# Patient Record
Sex: Female | Born: 1980 | Race: Black or African American | Hispanic: No | Marital: Single | Smoking: Former smoker
Health system: Southern US, Community
[De-identification: ages and names within clinical notes are randomized; demographics above are authoritative.]

## PROBLEM LIST (undated history)

## (undated) DIAGNOSIS — D649 Anemia, unspecified: Secondary | ICD-10-CM

## (undated) DIAGNOSIS — M545 Low back pain, unspecified: Secondary | ICD-10-CM

## (undated) DIAGNOSIS — E559 Vitamin D deficiency, unspecified: Secondary | ICD-10-CM

## (undated) HISTORY — DX: Vitamin D deficiency, unspecified: E55.9

---
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
## 2001-05-09 ENCOUNTER — Other Ambulatory Visit: Admission: RE | Admit: 2001-05-09 | Discharge: 2001-05-09 | Payer: Self-pay | Admitting: *Deleted

## 2001-10-30 ENCOUNTER — Inpatient Hospital Stay (HOSPITAL_COMMUNITY): Admission: AD | Admit: 2001-10-30 | Discharge: 2001-11-02 | Payer: Self-pay | Admitting: *Deleted

## 2002-08-16 ENCOUNTER — Other Ambulatory Visit: Admission: RE | Admit: 2002-08-16 | Discharge: 2002-08-16 | Payer: Self-pay | Admitting: *Deleted

## 2002-08-23 ENCOUNTER — Ambulatory Visit (HOSPITAL_COMMUNITY): Admission: RE | Admit: 2002-08-23 | Discharge: 2002-08-23 | Payer: Self-pay | Admitting: *Deleted

## 2002-08-23 ENCOUNTER — Encounter: Payer: Self-pay | Admitting: *Deleted

## 2002-11-12 ENCOUNTER — Inpatient Hospital Stay (HOSPITAL_COMMUNITY): Admission: AD | Admit: 2002-11-12 | Discharge: 2002-11-14 | Payer: Self-pay | Admitting: *Deleted

## 2003-11-26 ENCOUNTER — Inpatient Hospital Stay (HOSPITAL_COMMUNITY): Admission: AD | Admit: 2003-11-26 | Discharge: 2003-11-27 | Payer: Self-pay | Admitting: *Deleted

## 2004-07-19 ENCOUNTER — Emergency Department (HOSPITAL_COMMUNITY): Admission: EM | Admit: 2004-07-19 | Discharge: 2004-07-19 | Payer: Self-pay | Admitting: Emergency Medicine

## 2005-03-01 ENCOUNTER — Inpatient Hospital Stay (HOSPITAL_COMMUNITY): Admission: AD | Admit: 2005-03-01 | Discharge: 2005-03-01 | Payer: Self-pay | Admitting: Obstetrics and Gynecology

## 2005-03-03 ENCOUNTER — Inpatient Hospital Stay (HOSPITAL_COMMUNITY): Admission: AD | Admit: 2005-03-03 | Discharge: 2005-03-03 | Payer: Self-pay | Admitting: Obstetrics and Gynecology

## 2006-06-05 ENCOUNTER — Emergency Department (HOSPITAL_COMMUNITY): Admission: EM | Admit: 2006-06-05 | Discharge: 2006-06-06 | Payer: Self-pay | Admitting: Emergency Medicine

## 2007-01-17 ENCOUNTER — Emergency Department (HOSPITAL_COMMUNITY): Admission: EM | Admit: 2007-01-17 | Discharge: 2007-01-17 | Payer: Self-pay | Admitting: Emergency Medicine

## 2007-05-24 ENCOUNTER — Inpatient Hospital Stay (HOSPITAL_COMMUNITY): Admission: AD | Admit: 2007-05-24 | Discharge: 2007-05-24 | Payer: Self-pay | Admitting: Obstetrics & Gynecology

## 2007-05-29 ENCOUNTER — Ambulatory Visit (HOSPITAL_COMMUNITY): Admission: RE | Admit: 2007-05-29 | Discharge: 2007-05-29 | Payer: Self-pay | Admitting: Obstetrics and Gynecology

## 2007-07-18 ENCOUNTER — Inpatient Hospital Stay (HOSPITAL_COMMUNITY): Admission: AD | Admit: 2007-07-18 | Discharge: 2007-07-18 | Payer: Self-pay | Admitting: Obstetrics and Gynecology

## 2007-08-25 ENCOUNTER — Inpatient Hospital Stay (HOSPITAL_COMMUNITY): Admission: RE | Admit: 2007-08-25 | Discharge: 2007-08-28 | Payer: Self-pay | Admitting: Obstetrics and Gynecology

## 2007-12-31 ENCOUNTER — Emergency Department (HOSPITAL_COMMUNITY): Admission: EM | Admit: 2007-12-31 | Discharge: 2007-12-31 | Payer: Self-pay | Admitting: Emergency Medicine

## 2008-03-12 ENCOUNTER — Emergency Department (HOSPITAL_COMMUNITY): Admission: EM | Admit: 2008-03-12 | Discharge: 2008-03-12 | Payer: Self-pay | Admitting: Emergency Medicine

## 2009-12-15 ENCOUNTER — Ambulatory Visit: Payer: Self-pay | Admitting: Physician Assistant

## 2009-12-15 ENCOUNTER — Inpatient Hospital Stay (HOSPITAL_COMMUNITY): Admission: AD | Admit: 2009-12-15 | Discharge: 2009-12-15 | Payer: Self-pay | Admitting: Family Medicine

## 2010-08-23 LAB — URINALYSIS, ROUTINE W REFLEX MICROSCOPIC
Bilirubin Urine: NEGATIVE
Glucose, UA: NEGATIVE mg/dL
Ketones, ur: 15 mg/dL — AB
Leukocytes, UA: NEGATIVE
Nitrite: POSITIVE — AB
Protein, ur: 100 mg/dL — AB
Specific Gravity, Urine: >1.03 — ABNORMAL HIGH (ref 1.005–1.030)
Urobilinogen, UA: 1 mg/dL (ref 0.0–1.0)
pH: 5.5 (ref 5.0–8.0)

## 2010-08-23 LAB — POCT PREGNANCY, URINE: Preg Test, Ur: NEGATIVE

## 2010-08-23 LAB — WET PREP, GENITAL

## 2010-08-23 LAB — URINE CULTURE: Colony Count: >=100000

## 2010-08-23 LAB — URINE MICROSCOPIC-ADD ON

## 2010-10-20 NOTE — H&P (Signed)
NAMESHY, GUALLPA NO.:  [id number]   MEDICAL RECORD NO.:  [id number]           PATIENT TYPE:   LOCATION:                                 FACILITY:   PHYSICIAN:  Zenaida Niece, M.D.     DATE OF BIRTH:   DATE OF ADMISSION:  08/25/2007  DATE OF DISCHARGE:                              HISTORY & PHYSICAL   CHIEF COMPLAINT:  Repeat cesarean section.   HISTORY OF PRESENT ILLNESS:  This is a 30 year old black female gravida  3, para 2-0-0-2 with an EGA of [redacted] weeks by  a 26-week ultrasound, with a  due date of March 27 who presents for repeat cesarean section.  She has  had two prior cesarean sections and wishes to undergo a repeat cesarean  section.  Prenatal care has been complicated by the fact that she did  not establish prenatal care until 26 weeks.  She was also noted to have  low platelets on routine labs, without any significant symptoms.  She  did develop proteinuria with normal PIH labs and intermittently slightly  elevated blood pressure.  She did eventually have a 24-hour urine  collection which revealed 910 mg of protein and a creatinine clearance  of 154 mL per minute.  She was followed intermittently with nonstress  tests, which have been reactive.  She is being admitted today for repeat  cesarean section.   PRENATAL LABS:  Blood type is O+ with a negative antibody screen.  RPR  nonreactive, rubella immune, hepatitis B surface antigen negative, HIV  negative, gonorrhea and chlamydia negative.  Initial hemoglobin is 10.1,  and initial platelet count is 99,000.  One-hour Glucola is 57 and group  B strep is positive.   PAST OBSTETRIC HISTORY:  In 2003, she had a cesarean section at term for  non-reassuring fetal heart tracing, baby weighed 6 pounds, 12 ounces,  that was at 40 weeks.  In June 2004, she had a repeat cesarean section  at 40 weeks.  That baby weighed 5 pounds, 11 ounces.   GYNECOLOGICAL HISTORY:  She does have a remote history of  gonorrhea and  chlamydia.  No abnormal Pap smears, and she had a Mirena IUD removed in  September 2007.   PAST MEDICAL HISTORY:  The patient denies.   PAST SURGICAL HISTORY:  Cesarean section x2.   ALLERGIES:  NONE KNOWN.   CURRENT MEDICATIONS:  None.   REVIEW OF SYSTEMS:  Normal complaints of pregnancy.   SOCIAL HISTORY:  The patient is single and does admit to some tobacco  use.   FAMILY HISTORY:  Is noncontributory.   PHYSICAL EXAM:  VITAL SIGNS:  Weight is 249 pounds, blood pressure is  150/80.  GENERAL:  This is a slightly obese, gravid female in no acute distress.  NECK:  Supple without lymphadenopathy or thyromegaly.  LUNGS:  Clear to auscultation.  HEART:  Regular rate and rhythm without murmur.  ABDOMEN:  Gravid with a fundal height of 39-40 cm and an estimated fetal  weight of 7-1/2 pounds.  She does have a well-healed transverse scar.  EXTREMITIES:  Have 1+ edema and are nontender.  Cervix is 1, 40, -3 vertex presentation.   ASSESSMENT:  1. Intrauterine pregnancy at 39 weeks with two previous cesarean      sections.  The patient wishes to undergo a repeat cesarean section.      Surgery and risks have been discussed with the patient, and she      understands.  2. Proteinuria and mild thrombocytopenia.  She has had normal PIH labs      off and on throughout the pregnancy and only intermittently      elevated blood pressures, so I do not believe that she has      preeclampsia.  These labs will be evaluated pre-op by anesthesia,      to determine the feasibility of spinal anesthesia for her cesarean      section.  3. Group B strep positive.   PLAN:  Is to admit the patient on March 20 for repeat cesarean section.      Zenaida Niece, M.D.  Electronically Signed     TDM/MEDQ  D:  08/24/2007  T:  08/24/2007  Job:  [id number]

## 2010-10-20 NOTE — Op Note (Signed)
[name], [name]              ACCOUNT NO.:  [id number]   MEDICAL RECORD NO.:  [id number]          PATIENT TYPE:  INP   LOCATION:  9199                          FACILITY:  [facility]   PHYSICIAN:  Zenaida Niece, M.D.DATE OF BIRTH:  04/23/1981   DATE OF PROCEDURE:  08/25/2007  DATE OF DISCHARGE:                               OPERATIVE REPORT   PREOPERATIVE DIAGNOSES:  1. Intrauterine pregnancy at 39 weeks.  2. Previous cesarean section x2.  3. Proteinuria.  4. Thrombocytopenia.   POSTOPERATIVE DIAGNOSES:  1. Intrauterine pregnancy at 39 weeks.  2. Previous cesarean section x2.  3. Proteinuria.  4. Thrombocytopenia.   PROCEDURE:  Repeat low transverse cesarean section.   SURGEON:  Zenaida Niece, MD.   ASSISTANT:  Tracey Harries, MD.   ANESTHESIA:  Spinal.   FINDINGS:  The patient had a normal gravid anatomy except for some  omental adhesions to the anterior abdominal wall.  She delivered a  viable female infant with Apgars of 8 and 9, weight 6 pounds 3 ounces.   SPECIMENS:  Placenta sent for cord blood collection and then to labor  and delivery.   ESTIMATED BLOOD LOSS:  1000 mL.   COMPLICATIONS:  None.   PROCEDURE IN DETAIL:  The patient was taken to the operating room and  placed in the sitting position.  Dr. Malen Gauze instilled spinal anesthesia  and she was then placed in the dorsosupine position with a left lateral  tilt.  The abdomen was then prepped and draped in the usual sterile  fashion and a Foley catheter was inserted.  The level of her anesthesia  was found to be adequate and the abdomen was entered via a standard  Pfannenstiel's incision through her previous scar.  Once the peritoneal  cavity was entered, some filmy omental adhesions were noted.  These were  taken down with electrocautery.  Once the lower uterine segment was well  exposed, the Alexis disposable self-retaining retractor was placed and  good uterine exposure was noted.  A 4 cm transversus  incision was then  made in the lower uterine segment pushing the bladder inferior.  Once  the uterine cavity was entered,  the incision was extended bilaterally  digitally.  Membranes were ruptured revealing clear fluid.  The fetal  vertex was grasped and easily delivered through the incision.  The mouth  and nares were suctioned and the remainder of the infant delivered  atraumatically.  The cord was doubly clamped and cut and the infant  handed to the awaiting pediatric team.  The placenta delivered  spontaneously and was sent for cord blood collection.  The uterus was  wiped dry with a clean lap pad and all clots and debris removed. The  uterine incision was inspected and found to be free of extensions.  The  uterine incision was then closed in one layer being a running locking  layer with #1 chromic.  A small amount of bleeding from the right angle  was controlled with some imbricating sutures of #1 chromic.  The  remainder of the incision was hemostatic.  Tubes and ovaries were  inspected and found to be normal.  The uterine incision was again  inspected and found to be hemostatic.  The subfascial space was then  irrigated and made hemostatic with electrocautery after the retractor  was removed.  The rectus muscles were reapproximated in the midline due  to a significant diastasis with interrupted sutures of #1 chromic.  The  fascia was then closed in a running fashion starting at both ends and  meeting in the middle with #0 Vicryl.  The subcutaneous tissue was then  irrigated and made hemostatic with electrocautery.  The skin edges were  freed up with electrocautery.  The subcutaneous tissue was closed with  running 2-0 plain gut suture. The skin was then closed with staples.  The patient tolerated the procedure well and was taken to the recovery  room in stable condition.  Counts were correct x2, she was given Ancef 1  gram IV prior to the procedure and had PAS hose on throughout  the  procedure.      Zenaida Niece, M.D.  Electronically Signed     TDM/MEDQ  D:  08/25/2007  T:  08/25/2007  Job:  [id number]

## 2010-10-23 NOTE — Discharge Summary (Signed)
[name], [name]              ACCOUNT NO.:  [id number]   MEDICAL RECORD NO.:  [id number]          PATIENT TYPE:  INP   LOCATION:  9129                          FACILITY:  [facility]   PHYSICIAN:  Zenaida Niece, M.D.DATE OF BIRTH:  1981-01-23   DATE OF ADMISSION:  08/25/2007  DATE OF DISCHARGE:  08/28/2007                               DISCHARGE SUMMARY   ADMISSION DIAGNOSES:  1. Intrauterine pregnancy at 39 weeks.  2. Previous cesarean section x2.  3. Proteinuria.  4. Thrombocytopenia.   DISCHARGE DIAGNOSES:  1. Intrauterine pregnancy at 39 weeks.  2. Previous cesarean section x2.  3. Proteinuria.  4. Thrombocytopenia.   PROCEDURE:  On August 25, 2007, she had a repeat low-transverse cesarean  section.   HISTORY AND PHYSICAL:  Please see chart for full history and physical.  Briefly, this is a 30 year old black female gravida 3, para 2-0-0-2 with  an EGA of [redacted] weeks who is admitted for repeat cesarean section.  She has  had proteinuria with normal labs except for thrombocytopenia and  essentially normal blood pressures during the pregnancy.   PAST MEDICAL HISTORY:  Past history significant for 2 cesarean sections.   PHYSICAL EXAM:  VITAL SIGNS:  Weight is 249 pounds on admission, last  blood pressure in the office was 150/80.  ABDOMEN:  Obese, gravid, nontender, and a well-healed transverse scar.  CERVIX:  1, 40, -3, and vertex.   HOSPITAL COURSE:  The patient is admitted on the day of surgery and  underwent repeat cesarean section under spinal anesthesia.  She had  normal anatomy except for some omental adhesions.  She delivered a  viable female infant with Apgars of 8 and 9, weighed 6 pounds 3 ounces.  Estimated blood loss was 1000 mL.  Postoperatively, she had no  significant complications.  Blood pressures were 120s-140s over 80-90 on  postoperative day #1.  Predelivery hemoglobin 10.2, postdelivery 8.2.  Platelet count was 101,000 before delivery and 74,000 on  postoperative  day #1.  She was able to ambulate and remained afebrile.  On  postoperative day #2, hemoglobin dropped a little bit to 7.7 and  platelets were stable at 88,000.  On postoperative day #3, she was doing  well, ambulating, incision was well approximated, and she was felt to be  stable enough for discharge home.   DISCHARGE INSTRUCTIONS:  Regular diet, pelvic rest, and no strenuous  activities.  Followup is in 4 days to remove her staples.   MEDICATIONS:  Percocet #40 1-2 p.o. q.4-6 h. p.r.n. pain, and she was  given our discharge pamphlet.      Zenaida Niece, M.D.  Electronically Signed     TDM/MEDQ  D:  09/10/2007  T:  09/10/2007  Job:  [id number]

## 2010-10-23 NOTE — Op Note (Signed)
NAME:  [name], KISHBAUGH                        ACCOUNT NO.:  [id number]   MEDICAL RECORD NO.:  [id number]                   PATIENT TYPE:  INP   LOCATION:  9117                                 FACILITY:  [facility]   PHYSICIAN:  Georgina Peer, M.D.              DATE OF BIRTH:  05-03-1981   DATE OF PROCEDURE:  11/12/2002  DATE OF DISCHARGE:                                 OPERATIVE REPORT   PREOPERATIVE DIAGNOSES:  1. Previous section at 39+ weeks.  2. Hypertension.   POSTOPERATIVE DIAGNOSES:  1. Previous section at 39+ weeks.  2. Hypertension.   OPERATION PERFORMED:  Repeat low cervical transverse cesarean section.   SURGEON:  Georgina Peer, M.D.   ASSISTANT:  Rudy Jew. Sugey Royalty, M.D.   ESTIMATED BLOOD LOSS:  800 mL.   ANESTHESIA:  Spinal anesthesia per Ciano Corrente, M.D.   FINDINGS:  A 5 pound 10 ounce female at 8:06.  Apgars 5 and 8.  Meconium  stained fluid noted.  Nuchal cord x1 noted.  Cord pH 7.03 arterial.  Normal  tubes and ovaries.   COMPLICATIONS:  None.   INDICATIONS FOR PROCEDURE:  A 30 year old gravida 2, para 1 at 39+ weeks  presented for elective repeat cesarean section.  She declined VBAC,  understanding risks and complications for elective repeat section and risks  of trial of labor.  The patient had mildly elevated blood pressure at 140/82  in the office four days ago.  She denies headache, blurred vision or  epigastric pain.  She denied vaginal bleeding or excess swelling.  She had  reported normal fetal movement.  Blood pressure on admission was 142/93.  The patient had no epigastric pain, normal reflexes.  She was taken back to  the C-section suite and given spinal anesthetic by Dr. Harvest Forest.  After  adequate anesthesia, she was prepped and draped in the normal sterile  fashion.  Panniculus was taped up to allow good incisional exposure.  Transverse skin incision was made through the previous incision.  This was  taken down into the peritoneal  cavity in the normal Pfannenstiel manner.  There were no intra-abdominal adhesions noted.  Transverse bladder flap was  created.  There was a poorly  developed lower uterine segment in extensive  vascularity.  A midline incision was made in the uterus which was thin.  This was widened in a V-shape with bandage scissors.  An infant female was  delivered with the aid of fundal pressure at 8:06.  The membranes were  ruptured yielding particulate but thin meconium.  The infant was meconium  stained.  Mouth and nose were suctioned with DeLee and with bulb.  There was  a loose nuchal cord x1.  The Apgars were assigned at 5 and 8 and a cord pH  7.03 arterial.  The patient, after delivery, had the uterus exteriorized for  exposure. The incision was without extension.  The placenta  was manually  removed and the uterus contracted well.  The incision was closed with one  layer of running locked Vicryl with interrupted and figure-of-eight sutures  of Vicryl to control bleeding along the edges.  There was excellent  hemostasis. The tubes and ovaries were inspected and found to be normal.  The uterus was then placed back into the abdominal cavity and the incision  inspected and any bleeders were controlled with fine sutures.  The pelvis  was irrigated.  Blood and debris was removed.  The bladder flap was dry.  The peritoneum was closed with Vicryl suture. The fascia closed with Vicryl  suture.  The subcutaneous tissue cauterized for any bleeders and closed with  plain gut and the skin closed with skin staples.  The patient received 1 g  of Ancef after the cord was clamped and this will be continued for two more  doses postoperatively.  The patient was stable and sent to the recovery room  in good condition.  Preeclampsia labs, a catheterized urine and blood  pressure monitoring will take place postoperatively.                                               Georgina Peer, M.D.    JPN/MEDQ  D:   11/12/2002  T:  11/12/2002  Job:  [id number]

## 2010-10-23 NOTE — Discharge Summary (Signed)
[facility]  Patient:    [name], [name] Visit Number: [id number] MRN: [id number]          Service Type: OBS Location: 910A 9138 01 Attending Physician:  Pleas Koch Dictated by:   Georgina Peer, M.D. Admit Date:  10/30/2001 Discharge Date: 11/02/2001                             Discharge Summary  ADMISSION DIAGNOSES:          1. Pregnancy.                               2. Labor.                               3. Nonreassuring fetal heart rate tracing.  HISTORY OF PRESENT ILLNESS:   The patient is a 30 year old primigravida, [facility] Oct 31, 2001.  The patient presented with regular contractions, was managed by Dr. Wilhemina Cash in labor.  She had nonreassuring fetal heart rate tracing and rupture of membranes with moderate meconium.  She did not improve with amnioinfusion.  HOSPITAL COURSE:              She underwent a low transverse cesarean section on Oct 30, 2001, at which time a female infant, [name] was born, Apgars 8 and 9. Cord pH was 7.19.  Postpartum, the patient did well.  She remained afebrile. Postoperative hemoglobin was 9.7 from a preoperative of 11.4. Platelet count was mildly decreased at 91,000 with a preoperative of 106,000. The patients white count was 9.9.  She was eating, ambulating, and voiding without difficulty.  She transitioned to solid food.  Her wound was clean and dry and a subcuticular suture had been placed.  She had met all of the criteria for discharge, passing flatus, and ambulating without dizziness.  She was discharged on the third day in good condition with prescription for Percocet and Motrin.  She was given a discharge booklet and will follow up in the office in four to six weeks.  She will watch for signs of infection, fever, or heavy vaginal bleeding.  She will avoid intercourse and heavy lifting for six weeks and will call for any problems.  Repeat platelet count will be performed as well as hemoglobin at  the six-week mark. Dictated by:   Georgina Peer, M.D. Attending Physician:  Pleas Koch DD:  11/02/01 TD:  11/03/01 Job: [id number]

## 2010-10-23 NOTE — Discharge Summary (Signed)
   NAME:  [name], [name]                        ACCOUNT NO.:  [id number]   MEDICAL RECORD NO.:  [id number]                   PATIENT TYPE:  INP   LOCATION:  9117                                 [facility]ACILITY[facility]   PHYSICIAN:  Georgina Peer, M.D.              DATE OF BIRTH:  04-04-1981   DATE OF ADMISSION:  11/12/2002  DATE OF DISCHARGE:  11/14/2002                                 DISCHARGE SUMMARY   ADMISSION DIAGNOSIS:  Previous cesarean section, desires repeat.   DISCHARGE DIAGNOSES:  1. Previous cesarean section, desires repeat.  2. Blood-loss-related mild anemia which is well compensated.   BRIEF HISTORY:  A 30 year old gravida 2 para 1 presented for repeat C  section; see H&P for details.   The C section was uneventful and went well.  The infant had mild meconium.  The weight was 5 pounds 10 ounces and it was a female.  Apgars were 5 and 8.  There was an 800 mL blood loss.  The patient postpartum did well.  She  passed flatus by the first day.  She was ambulating, eating, and advanced to  solid foods.  She was taking Percocet and Motrin for pain.  Her hemoglobin  was 8.6 from preoperative of 10.9 with platelet count of 90,000 which was up  from 80,000 the day of surgery.  Her white blood cell count was 8.2.  Her  PIH labs were normal and her urine protein was negative.  The patient's  incision was clean, the staples were intact, there was no drainage.  Her  abdomen was soft and nontender.   She was ready for discharge by postoperative day #2 and discharged home with  prescriptions for Percocet 5/325 #60 and Motrin  600 mg #60.  She was given a discharge booklet.  She will return to the  office within one week for staple removal.                                               Georgina Peer, M.D.    JPN/MEDQ  D:  11/14/2002  T:  11/14/2002  Job:  [id number]

## 2010-10-23 NOTE — Op Note (Signed)
[facility]  Patient:    [name], [name] Visit Number: [id number] MRN: [id number]          Service Type: OBS Location: 910A 9138 01 Attending Physician:  Pleas Koch Dictated by:   Ronda Fairly. Galen Daft, M.D. Proc. Date: 10/30/01 Admit Date:  10/30/2001   CC:         Georgina Peer, M.D.   Operative Report  PREOPERATIVE DIAGNOSES:       Fetal distress, term pregnancy with meconium.  POSTOPERATIVE DIAGNOSES:      Fetal distress, term pregnancy with meconium.  PROCEDURE:                    Primary low transverse cesarean section.  SURGEON:                      Ronda Fairly. Galen Daft, M.D.  ANESTHESIA:                   Epidural.  CONSULTATION:                 None.  ESTIMATED BLOOD LOSS:         500 cc.  PROCEDURE NOTE:               The patient was in the labor room with nonreassuring tracing.  Rupture of membranes was carried out, there was moderate meconium noted.  Amnio infusion started and internal lead was placed with intrauterine pressure catheter.  The tracing was defined as "repetitive late deceleration, with normal beat-to-beat variability and moderate meconium."  The decision was made that this was acceptable for condition for immediate primary cesarean section.  The patient was apprised of this option and we both consented to the same.  The patient was brought back to the operating room.  She received prophylactic antibiotic therapy.  The indwelling Foley catheter in bladder.  Prepped with Betadine and draped in sterile technique.  A Pfannenstiel incision was utilized after anesthesia was adequately tested.  The abdomen was entered without difficulty.  The bladder flap was developed using the visceral peritoneum and the bladder blade.  The adjacent organs were retracted out of the way, and the low cervical uterine incision was carried out.  The baby was occiput posterior position.  There was moderate meconium.  After delivery  of the head, both the nasopharynx on both sides and the oropharynx were suctioned with the [facility] suction-type catheter.  The pediatrician was present at the delivery.  The baby was brought to the warmer immediately for additional suctioning.  There was no meconium below the cords.  The cord pH came back as 7.19.  The Apgar scores for the baby were 8 at one minute and 9 at five minutes.  The babys name was Deronte; his weight was 6 pounds 12 ounces.  The placenta was delivered manually.  The uterus swept free of all contents, and it was clear and empty.  The uterus was then closed with 0 Vicryl in a running locking fashion, followed by embrocating layer.  There was complete hemostasis noted.  Both tubes and ovaries were unremarkable.  There was a small posterior uterine fibroid, less than 1 cm, mid position.  The remainder of the abdominal cavity was unremarkable from the lower pelvis perspective.  The abdomen was irrigated and suctioned to remove all material.  All fascial tissues were hemostatic.  The peritoneum was closed with Monocryl suture and all subfascial tissues  were again inspected.  The fascia was then closed with #1 Vicryl in a running suture.  This was followed by inspection of subcutaneous tissues, all subcutaneous tissues were hemostatic.  The skin was closed with subcuticular closure.  All instrument, sponge and needle counts were correct throughout the case and at the end of the case.  The patient left the operating room in stable condition.  No complications. Dictated by:   Ronda Fairly. Galen Daft, M.D. Attending Physician:  Pleas Koch DD:  10/30/01 TD:  11/01/01 Job: [id number]

## 2011-02-26 LAB — URINALYSIS, ROUTINE W REFLEX MICROSCOPIC
Ketones, ur: NEGATIVE
Nitrite: NEGATIVE
Protein, ur: 100 — AB
Urobilinogen, UA: 0.2
pH: 7

## 2011-03-01 LAB — URINALYSIS, ROUTINE W REFLEX MICROSCOPIC
Ketones, ur: NEGATIVE
Leukocytes, UA: NEGATIVE
Nitrite: NEGATIVE
Specific Gravity, Urine: 1.025
Urobilinogen, UA: 0.2
pH: 6.5

## 2011-03-01 LAB — URINE MICROSCOPIC-ADD ON

## 2011-03-01 LAB — CBC
HCT: 23.8 — ABNORMAL LOW
HCT: 29.8 — ABNORMAL LOW
Hemoglobin: 10.2 — ABNORMAL LOW
MCHC: 33.5
MCHC: 34.1
MCHC: 34.3
MCV: 89.6
Platelets: 74 — ABNORMAL LOW
Platelets: 88 — ABNORMAL LOW
RBC: 2.55 — ABNORMAL LOW
RDW: 12.5
RDW: 12.6
RDW: 13

## 2011-03-01 LAB — COMPREHENSIVE METABOLIC PANEL
CO2: 21
Calcium: 8.8
Creatinine, Ser: 0.43
GFR calc non Af Amer: >60
Glucose, Bld: 77

## 2011-03-12 LAB — GC/CHLAMYDIA PROBE AMP, GENITAL
Chlamydia, DNA Probe: NEGATIVE
GC Probe Amp, Genital: NEGATIVE

## 2011-03-12 LAB — WET PREP, GENITAL
Trich, Wet Prep: NONE SEEN
Yeast Wet Prep HPF POC: NONE SEEN

## 2011-03-12 LAB — URINALYSIS, ROUTINE W REFLEX MICROSCOPIC
Glucose, UA: NEGATIVE
Ketones, ur: 15 — AB
Nitrite: NEGATIVE
Protein, ur: 100 — AB
Urobilinogen, UA: 0.2

## 2011-03-12 LAB — POCT PREGNANCY, URINE: Operator id: 120561

## 2011-03-12 LAB — URINE MICROSCOPIC-ADD ON

## 2012-02-10 ENCOUNTER — Emergency Department (HOSPITAL_COMMUNITY)
Admission: EM | Admit: 2012-02-10 | Discharge: 2012-02-10 | Disposition: A | Discharge status: Home / Self Care | Payer: Self-pay | Attending: Emergency Medicine | Admitting: Emergency Medicine

## 2012-02-10 ENCOUNTER — Encounter (HOSPITAL_COMMUNITY): Payer: Self-pay | Admitting: Emergency Medicine

## 2012-02-10 DIAGNOSIS — K529 Noninfective gastroenteritis and colitis, unspecified: Secondary | ICD-10-CM

## 2012-02-10 DIAGNOSIS — F17200 Nicotine dependence, unspecified, uncomplicated: Secondary | ICD-10-CM | POA: Insufficient documentation

## 2012-02-10 DIAGNOSIS — R109 Unspecified abdominal pain: Secondary | ICD-10-CM | POA: Insufficient documentation

## 2012-02-10 LAB — CBC WITH DIFFERENTIAL/PLATELET
Basophils Relative: 0 % (ref 0–1)
Eosinophils Absolute: 0 10*3/uL (ref 0.0–0.7)
Eosinophils Relative: 0 % (ref 0–5)
Lymphs Abs: 1 10*3/uL (ref 0.7–4.0)
MCH: 30 pg (ref 26.0–34.0)
MCHC: 33 g/dL (ref 30.0–36.0)
MCV: 90.7 fL (ref 78.0–100.0)
Neutrophils Relative %: 82 % — ABNORMAL HIGH (ref 43–77)
Platelets: 129 10*3/uL — ABNORMAL LOW (ref 150–400)

## 2012-02-10 LAB — COMPREHENSIVE METABOLIC PANEL
ALT: 10 U/L (ref 0–35)
Albumin: 3.9 g/dL (ref 3.5–5.2)
Alkaline Phosphatase: 77 U/L (ref 39–117)
BUN: 14 mg/dL (ref 6–23)
Calcium: 9.3 mg/dL (ref 8.4–10.5)
GFR calc Af Amer: 90 mL/min — ABNORMAL LOW (ref >90)
Glucose, Bld: 89 mg/dL (ref 70–99)
Potassium: 4 mEq/L (ref 3.5–5.1)
Sodium: 138 mEq/L (ref 135–145)
Total Protein: 7.7 g/dL (ref 6.0–8.3)

## 2012-02-10 LAB — URINALYSIS, ROUTINE W REFLEX MICROSCOPIC
Leukocytes, UA: NEGATIVE
Nitrite: POSITIVE — AB
Specific Gravity, Urine: 1.034 — ABNORMAL HIGH (ref 1.005–1.030)
Urobilinogen, UA: 1 mg/dL (ref 0.0–1.0)
pH: 6 (ref 5.0–8.0)

## 2012-02-10 LAB — URINE MICROSCOPIC-ADD ON

## 2012-02-10 LAB — POCT PREGNANCY, URINE: Preg Test, Ur: NEGATIVE

## 2012-02-10 LAB — LIPASE, BLOOD: Lipase: 19 U/L (ref 11–59)

## 2012-02-10 MED ORDER — SODIUM CHLORIDE 0.9 % IV SOLN
INTRAVENOUS | Status: DC
  Administered 2012-02-10: 19:00:00 via INTRAVENOUS

## 2012-02-10 MED ORDER — ONDANSETRON HCL 4 MG/2ML IJ SOLN
4.0000 mg | Freq: Once | INTRAMUSCULAR | Status: AC
  Administered 2012-02-10: 4 mg via INTRAVENOUS
  Filled 2012-02-10: qty 2

## 2012-02-10 MED ORDER — ONDANSETRON HCL 4 MG PO TABS: 4.0000 mg | ORAL_TABLET | Freq: Four times a day (QID) | ORAL | Status: AC

## 2012-02-10 MED ORDER — SODIUM CHLORIDE 0.9 % IV BOLUS (SEPSIS)
1000.0000 mL | Freq: Once | INTRAVENOUS | Status: AC
  Administered 2012-02-10: 1000 mL via INTRAVENOUS

## 2012-02-10 NOTE — ED Notes (Signed)
Pt states she is feeling better. No longer feels nauseous.

## 2012-02-10 NOTE — ED Notes (Signed)
Pt d/c home in NAD. Pt voiced understanding of d/c instructions and follow up care.  

## 2012-02-10 NOTE — ED Notes (Signed)
Pt c/o n/v x24hrs. Denies urinary complaints.

## 2012-02-10 NOTE — ED Notes (Signed)
Pt states she is continuing to feel better. Currently voicing no complaints.

## 2012-02-10 NOTE — ED Provider Notes (Addendum)
History     CSN: [id number]  Arrival date & time 02/10/12  1315   First MD Initiated Contact with Patient 02/10/12 1641      Chief Complaint  Patient presents with  . Abdominal Pain    (Consider location/radiation/quality/duration/timing/severity/associated sxs/prior treatment) Patient is a 31 y.o. female presenting with abdominal pain. The history is provided by the patient.  Abdominal Pain The primary symptoms of the illness include abdominal pain.   patient here with vomiting and upper abdominal cramping x24 hours. Notes positive sick exposures. No fever or diarrhea. Denies vaginal bleeding or discharge. Denies any urinary symptoms. Vomiting has been nonbilious. Nothing makes her symptoms better or worse and has used Pepto-Bismol without relief  History reviewed. No pertinent past medical history.  No past surgical history on file.  No family history on file.  History  Substance Use Topics  . Smoking status: Current Everyday Smoker  . Smokeless tobacco: Not on file  . Alcohol Use: Yes    OB History    Grav Para Term Preterm Abortions TAB SAB Ect Mult Living                  Review of Systems  Gastrointestinal: Positive for abdominal pain.  All other systems reviewed and are negative.    Allergies  Review of patient's allergies indicates no known allergies.  Home Medications   Current Outpatient Rx  Name Route Sig Dispense Refill  . ACETAMINOPHEN 500 MG PO TABS Oral Take 500 mg by mouth every 6 (six) hours as needed. For pain.    Marland Kitchen BISMUTH SUBSALICYLATE 262 MG PO CHEW Oral Chew 524 mg by mouth daily as needed. For indigestion.      BP 132/74  Pulse 63  Temp 98.2 F (36.8 C)  Resp 16  SpO2 99%  Physical Exam  Nursing note and vitals reviewed. Constitutional: She is oriented to person, place, and time. Vital signs are normal. She appears well-developed and well-nourished.  Non-toxic appearance. No distress.  HENT:  Head: Normocephalic and atraumatic.    Eyes: Conjunctivae, EOM and lids are normal. Pupils are equal, round, and reactive to light.  Neck: Normal range of motion. Neck supple. No tracheal deviation present. No mass present.  Cardiovascular: Normal rate, regular rhythm and normal heart sounds.  Exam reveals no gallop.   No murmur heard. Pulmonary/Chest: Effort normal and breath sounds normal. No stridor. No respiratory distress. She has no decreased breath sounds. She has no wheezes. She has no rhonchi. She has no rales.  Abdominal: Soft. Normal appearance and bowel sounds are normal. She exhibits no distension. There is tenderness in the epigastric area. There is no rigidity, no rebound, no guarding and no CVA tenderness.  Musculoskeletal: Normal range of motion. She exhibits no edema and no tenderness.  Neurological: She is alert and oriented to person, place, and time. She has normal strength. No cranial nerve deficit or sensory deficit. GCS eye subscore is 4. GCS verbal subscore is 5. GCS motor subscore is 6.  Skin: Skin is warm and dry. No abrasion and no rash noted.  Psychiatric: She has a normal mood and affect. Her speech is normal and behavior is normal.    ED Course  Procedures (including critical care time)  Labs Reviewed  CBC WITH DIFFERENTIAL - Abnormal; Notable for the following:    Platelets 129 (*)     Neutrophils Relative 82 (*)     All other components within normal limits  COMPREHENSIVE METABOLIC PANEL -  Abnormal; Notable for the following:    GFR calc non Af Amer 78 (*)     GFR calc Af Amer 90 (*)     All other components within normal limits  URINALYSIS, ROUTINE W REFLEX MICROSCOPIC - Abnormal; Notable for the following:    Color, Urine AMBER (*)  BIOCHEMICALS MAY BE AFFECTED BY COLOR   APPearance HAZY (*)     Specific Gravity, Urine 1.034 (*)     Hgb urine dipstick SMALL (*)     Bilirubin Urine SMALL (*)     Ketones, ur 15 (*)     Protein, ur 100 (*)     Nitrite POSITIVE (*)     All other components  within normal limits  URINE MICROSCOPIC-ADD ON - Abnormal; Notable for the following:    Squamous Epithelial / LPF MANY (*)     Bacteria, UA MANY (*)     All other components within normal limits  LIPASE, BLOOD  POCT PREGNANCY, URINE   No results found.   No diagnosis found.    MDM  Pt given iv fluids and feel better--repeat abdominal exam benign--no cocnern for appy, ectopic--suspect viral gastro        Toy Baker, MD 02/10/12 1913  Toy Baker, MD 02/10/12 [phone]

## 2012-02-10 NOTE — ED Notes (Signed)
N/v abd pain x 2 4 hours vomiting alot

## 2013-09-24 ENCOUNTER — Emergency Department (HOSPITAL_COMMUNITY): Payer: Self-pay

## 2013-09-24 ENCOUNTER — Encounter (HOSPITAL_COMMUNITY): Payer: Self-pay | Admitting: Emergency Medicine

## 2013-09-24 ENCOUNTER — Emergency Department (HOSPITAL_COMMUNITY)
Admission: EM | Admit: 2013-09-24 | Discharge: 2013-09-24 | Disposition: A | Discharge status: Home / Self Care | Payer: Self-pay | Attending: Emergency Medicine | Admitting: Emergency Medicine

## 2013-09-24 DIAGNOSIS — R059 Cough, unspecified: Secondary | ICD-10-CM | POA: Insufficient documentation

## 2013-09-24 DIAGNOSIS — Z862 Personal history of diseases of the blood and blood-forming organs and certain disorders involving the immune mechanism: Secondary | ICD-10-CM | POA: Insufficient documentation

## 2013-09-24 DIAGNOSIS — Z8739 Personal history of other diseases of the musculoskeletal system and connective tissue: Secondary | ICD-10-CM | POA: Insufficient documentation

## 2013-09-24 DIAGNOSIS — R0789 Other chest pain: Secondary | ICD-10-CM

## 2013-09-24 DIAGNOSIS — R071 Chest pain on breathing: Secondary | ICD-10-CM | POA: Insufficient documentation

## 2013-09-24 DIAGNOSIS — Z3202 Encounter for pregnancy test, result negative: Secondary | ICD-10-CM | POA: Insufficient documentation

## 2013-09-24 DIAGNOSIS — R05 Cough: Secondary | ICD-10-CM | POA: Insufficient documentation

## 2013-09-24 DIAGNOSIS — F17200 Nicotine dependence, unspecified, uncomplicated: Secondary | ICD-10-CM | POA: Insufficient documentation

## 2013-09-24 DIAGNOSIS — N390 Urinary tract infection, site not specified: Secondary | ICD-10-CM | POA: Insufficient documentation

## 2013-09-24 HISTORY — DX: Low back pain: M54.5

## 2013-09-24 HISTORY — DX: Anemia, unspecified: D64.9

## 2013-09-24 HISTORY — DX: Low back pain, unspecified: M54.50

## 2013-09-24 LAB — URINALYSIS, ROUTINE W REFLEX MICROSCOPIC
BILIRUBIN URINE: NEGATIVE
Glucose, UA: NEGATIVE mg/dL
Hgb urine dipstick: NEGATIVE
Ketones, ur: NEGATIVE mg/dL
NITRITE: NEGATIVE
PH: 5.5 (ref 5.0–8.0)
Protein, ur: NEGATIVE mg/dL
SPECIFIC GRAVITY, URINE: 1.029 (ref 1.005–1.030)
UROBILINOGEN UA: 0.2 mg/dL (ref 0.0–1.0)

## 2013-09-24 LAB — BASIC METABOLIC PANEL
BUN: 17 mg/dL (ref 6–23)
CALCIUM: 9.4 mg/dL (ref 8.4–10.5)
CHLORIDE: 104 meq/L (ref 96–112)
CO2: 22 meq/L (ref 19–32)
Creatinine, Ser: 0.92 mg/dL (ref 0.50–1.10)
GFR calc Af Amer: >90 mL/min (ref >90)
GFR calc non Af Amer: 82 mL/min — ABNORMAL LOW (ref >90)
GLUCOSE: 79 mg/dL (ref 70–99)
Potassium: 4.4 mEq/L (ref 3.7–5.3)
SODIUM: 139 meq/L (ref 137–147)

## 2013-09-24 LAB — CBC
HCT: 39.9 % (ref 36.0–46.0)
HEMOGLOBIN: 13.3 g/dL (ref 12.0–15.0)
MCH: 30.4 pg (ref 26.0–34.0)
MCHC: 33.3 g/dL (ref 30.0–36.0)
MCV: 91.3 fL (ref 78.0–100.0)
Platelets: 160 10*3/uL (ref 150–400)
RBC: 4.37 MIL/uL (ref 3.87–5.11)
RDW: 12.9 % (ref 11.5–15.5)
WBC: 6.8 10*3/uL (ref 4.0–10.5)

## 2013-09-24 LAB — D-DIMER, QUANTITATIVE: D-Dimer, Quant: <0.27 ug/mL-FEU (ref 0.00–0.48)

## 2013-09-24 LAB — URINE MICROSCOPIC-ADD ON

## 2013-09-24 LAB — I-STAT TROPONIN, ED: TROPONIN I, POC: 0 ng/mL (ref 0.00–0.08)

## 2013-09-24 LAB — PREGNANCY, URINE: PREG TEST UR: NEGATIVE

## 2013-09-24 LAB — POC URINE PREG, ED: PREG TEST UR: NEGATIVE

## 2013-09-24 MED ORDER — NAPROXEN 250 MG PO TABS: 250.0000 mg | ORAL_TABLET | Freq: Two times a day (BID) | ORAL | Status: DC

## 2013-09-24 MED ORDER — IBUPROFEN 400 MG PO TABS
400.0000 mg | ORAL_TABLET | Freq: Once | ORAL | Status: AC
  Administered 2013-09-24: 400 mg via ORAL
  Filled 2013-09-24: qty 1

## 2013-09-24 MED ORDER — METHOCARBAMOL 500 MG PO TABS: 1000.0000 mg | ORAL_TABLET | Freq: Four times a day (QID) | ORAL | Status: DC | PRN

## 2013-09-24 MED ORDER — HYDROCODONE-ACETAMINOPHEN 5-325 MG PO TABS
1.0000 | ORAL_TABLET | Freq: Once | ORAL | Status: AC
  Administered 2013-09-24: 1 via ORAL
  Filled 2013-09-24: qty 1

## 2013-09-24 MED ORDER — HYDROCODONE-ACETAMINOPHEN 5-325 MG PO TABS: ORAL_TABLET | ORAL | Status: DC

## 2013-09-24 MED ORDER — CEPHALEXIN 500 MG PO CAPS: 500.0000 mg | ORAL_CAPSULE | Freq: Four times a day (QID) | ORAL | Status: DC

## 2013-09-24 NOTE — ED Provider Notes (Signed)
CSN: [id number]     Arrival date & time 09/24/13  [id number] History   First MD Initiated Contact with Patient 09/24/13 1034     Chief Complaint  Patient presents with  . Chest Pain  . Cough      HPI Pt was seen at 1045. Per pt, c/o gradual onset and persistence of constant right sided chest wall "pain" that began 1 week ago. Has been associated with coughing. Describes the pain as "sore," and radiates around to the side of her right chest wall. Pain worsens with palpation of the area, body position changes and coughing. Has not taken any OTC's for pain. Denies abd pain, no N/V/D, no flank pain, no palpitations, no SOB, no fevers, no rash.    Past Medical History  Diagnosis Date  . Anemia   . Low back pain    Past Surgical History  Procedure Laterality Date  . Cesarean section  2003, 2004, 2009    History  Substance Use Topics  . Smoking status: Current Every Day Smoker  . Smokeless tobacco: Not on file  . Alcohol Use: No    Review of Systems ROS: Statement: All systems negative except as marked or noted in the HPI; Constitutional: Negative for fever and chills. ; ; Eyes: Negative for eye pain, redness and discharge. ; ; ENMT: Negative for ear pain, hoarseness, nasal congestion, sinus pressure and sore throat. ; ; Cardiovascular: Negative for chest pain, palpitations, diaphoresis, dyspnea and peripheral edema. ; ; Respiratory: +cough. Negative for wheezing and stridor. ; ; Gastrointestinal: Negative for nausea, vomiting, diarrhea, abdominal pain, blood in stool, hematemesis, jaundice and rectal bleeding. . ; ; Genitourinary: Negative for dysuria, flank pain and hematuria. ; ; Musculoskeletal: +right chest wall pain. Negative for back pain and neck pain. Negative for swelling and trauma.; ; Skin: Negative for pruritus, rash, abrasions, blisters, bruising and skin lesion.; ; Neuro: Negative for headache, lightheadedness and neck stiffness. Negative for weakness, altered level of consciousness  , altered mental status, extremity weakness, paresthesias, involuntary movement, seizure and syncope.      Allergies  Review of patient's allergies indicates no known allergies.  Home Medications   Prior to Admission medications   Medication Sig Start Date End Date Taking? Authorizing Provider  acetaminophen (TYLENOL) 500 MG tablet Take 500 mg by mouth every 6 (six) hours as needed. For pain.    Historical Provider, MD  bismuth subsalicylate (PEPTO BISMOL) 262 MG chewable tablet Chew 524 mg by mouth daily as needed. For indigestion.    Historical Provider, MD   BP 121/73  Pulse 74  Temp(Src) 98.2 F (36.8 C) (Oral)  Resp 17  Wt 233 lb (105.688 kg)  SpO2 99%  LMP 08/31/2013 Physical Exam 1050: Physical examination:  Nursing notes reviewed; Vital signs and O2 SAT reviewed;  Constitutional: Well developed, Well nourished, Well hydrated, In no acute distress; Head:  Normocephalic, atraumatic; Eyes: EOMI, PERRL, No scleral icterus; ENMT: Mouth and pharynx normal, Mucous membranes moist; Neck: Supple, Full range of motion, No lymphadenopathy; Cardiovascular: Regular rate and rhythm, No murmur, rub, or gallop; Respiratory: Breath sounds clear & equal bilaterally, No rales, rhonchi, wheezes.  Speaking full sentences with ease, Normal respiratory effort/excursion; Chest: +right mid anterior-lateral chest wall tender to palp. No rash, no soft tissue crepitus, no deformity. Movement normal; Abdomen: Soft, Nontender, Nondistended, Normal bowel sounds; Genitourinary: No CVA tenderness; Spine:  No midline CS, TS, LS tenderness. No rash.;; Extremities: Pulses normal, No tenderness, No edema, No calf edema or  asymmetry.; Neuro: AA&Ox3, Major CN grossly intact.  Speech clear. No gross focal motor or sensory deficits in extremities. Climbs on and off stretcher easily by herself. Gait steady.; Skin: Color normal, Warm, Dry.    ED Course  Procedures     EKG Interpretation   Date/Time:  Monday September 24 2013 10:07:36 EDT Ventricular Rate:  70 PR Interval:  144 QRS Duration: 80 QT Interval:  392 QTC Calculation: 423 R Axis:   72 Text Interpretation:  Normal sinus rhythm Normal ECG No old tracing to  compare Confirmed by [facility]  MD, Nicholos Johns [phone]) on 09/24/2013 10:34:58  AM      MDM  MDM Reviewed: previous chart, nursing note and vitals Reviewed previous: labs Interpretation: labs, ECG and x-ray    Results for orders placed during the hospital encounter of 09/24/13  CBC      Result Value Ref Range   WBC 6.8  4.0 - 10.5 K/uL   RBC 4.37  3.87 - 5.11 MIL/uL   Hemoglobin 13.3  12.0 - 15.0 g/dL   HCT 60.4  54.0 - 98.1 %   MCV 91.3  78.0 - 100.0 fL   MCH 30.4  26.0 - 34.0 pg   MCHC 33.3  30.0 - 36.0 g/dL   RDW 19.1  47.8 - 29.5 %   Platelets 160  150 - 400 K/uL  BASIC METABOLIC PANEL      Result Value Ref Range   Sodium 139  137 - 147 mEq/L   Potassium 4.4  3.7 - 5.3 mEq/L   Chloride 104  96 - 112 mEq/L   CO2 22  19 - 32 mEq/L   Glucose, Bld 79  70 - 99 mg/dL   BUN 17  6 - 23 mg/dL   Creatinine, Ser 6.21  0.50 - 1.10 mg/dL   Calcium 9.4  8.4 - 30.8 mg/dL   GFR calc non Af Amer 82 (*) >90 mL/min   GFR calc Af Amer >90  >90 mL/min  D-DIMER, QUANTITATIVE      Result Value Ref Range   D-Dimer, Quant <0.27  0.00 - 0.48 ug/mL-FEU  PREGNANCY, URINE      Result Value Ref Range   Preg Test, Ur NEGATIVE  NEGATIVE  URINALYSIS, ROUTINE W REFLEX MICROSCOPIC      Result Value Ref Range   Color, Urine YELLOW  YELLOW   APPearance HAZY (*) CLEAR   Specific Gravity, Urine 1.029  1.005 - 1.030   pH 5.5  5.0 - 8.0   Glucose, UA NEGATIVE  NEGATIVE mg/dL   Hgb urine dipstick NEGATIVE  NEGATIVE   Bilirubin Urine NEGATIVE  NEGATIVE   Ketones, ur NEGATIVE  NEGATIVE mg/dL   Protein, ur NEGATIVE  NEGATIVE mg/dL   Urobilinogen, UA 0.2  0.0 - 1.0 mg/dL   Nitrite NEGATIVE  NEGATIVE   Leukocytes, UA SMALL (*) NEGATIVE  URINE MICROSCOPIC-ADD ON      Result Value Ref Range   Squamous  Epithelial / LPF FEW (*) RARE   WBC, UA 3-6  <3 WBC/hpf   RBC / HPF 0-2  <3 RBC/hpf   Bacteria, UA MANY (*) RARE   Urine-Other MUCOUS PRESENT    I-STAT TROPOININ, ED      Result Value Ref Range   Troponin i, poc 0.00  0.00 - 0.08 ng/mL   Comment 3           POC URINE PREG, ED      Result Value Ref Range  Preg Test, Ur NEGATIVE  NEGATIVE   Dg Chest 2 View (if Patient Has Fever And/or Copd) 09/24/2013   CLINICAL DATA:  Chest pain, productive cough.  EXAM: CHEST  2 VIEW  COMPARISON:  None.  FINDINGS: The heart size and mediastinal contours are within normal limits. Both lungs are clear. The visualized skeletal structures are unremarkable.  IMPRESSION: No active cardiopulmonary disease.   Electronically Signed   By: Charlett NoseKevin  Dover M.D.   On: 09/24/2013 12:17    1245:  Doubt PE as cause for symptoms with normal d-dimer and low risk Wells. Abd remains benign. Will tx for UTI and msk pain symptomatically at this time. Pt wants to go home now. Dx and testing d/w pt and family.  Questions answered.  Verb understanding, agreeable to d/c home with outpt f/u.   Laray AngerKathleen M Cadee Agro, DO 09/26/13 [phone]

## 2013-09-24 NOTE — ED Notes (Signed)
Pt c/o right chest pain that radiates to back. Pt reports productive cough. Pt talking in complete sentences without difficulty.

## 2013-09-24 NOTE — Discharge Instructions (Signed)
°Emergency Department Resource Guide °1) Find a Doctor and Pay Out of Pocket °Although you won't have to find out who is covered by your insurance plan, it is a good idea to ask around and get recommendations. You will then need to call the office and see if the doctor you have chosen will accept you as a new patient and what types of options they offer for patients who are self-pay. Some doctors offer discounts or will set up payment plans for their patients who do not have insurance, but you will need to ask so you aren't surprised when you get to your appointment. ° °2) Contact Your Local Health Department °Not all health departments have doctors that can see patients for sick visits, but many do, so it is worth a call to see if yours does. If you don't know where your local health department is, you can check in your phone book. The CDC also has a tool to help you locate your state's health department, and many state websites also have listings of all of their local health departments. ° °3) Find a Walk-in Clinic °If your illness is not likely to be very severe or complicated, you may want to try a walk in clinic. These are popping up all over the country in pharmacies, drugstores, and shopping centers. They're usually staffed by nurse practitioners or physician assistants that have been trained to treat common illnesses and complaints. They're usually fairly quick and inexpensive. However, if you have serious medical issues or chronic medical problems, these are probably not your best option. ° °No Primary Care Doctor: °- Call [facility] at  832-8000 - they can help you locate a primary care doctor that  accepts your insurance, provides certain services, etc. °- Physician Referral Service- 1-800-533-3463 ° °Chronic Pain Problems: °Organization         Address  Phone   Notes  °[facility]  (336) 297-2271 Patients need to be referred by their primary care doctor.  ° °Medication  Assistance: °Organization         Address  Phone   Notes  °[place] [facility] [address] °[place], [place] [address] (336) 641-8030 --Must be a resident of [place] °-- Must have NO insurance coverage whatsoever (no Medicaid/ Medicare, etc.) °-- The pt. MUST have a primary care doctor that directs their care regularly and follows them in the community °  °[facility]  (866) 331-1348   °[employer]  (888) 892-1162   ° °Agencies that provide inexpensive medical care: °Organization         Address  Phone   Notes  °[facility]  (336) 832-8035   °[facility]    (336) 832-7272   °[facility] [address] °[place], [place] [address] (336) 832-4777   °[facility] [address], °[place] (336) 271-4999   °[facility]    (336) 373-0678   °[facility]    (336) 272-1050   °[facility] ° [address], [place] Phone:  (336) 832-4444, Fax:  (336) 832-4440 Hours of Operation:  9 am - 6 pm, M-F.  Also accepts Medicaid/Medicare and self-pay.  °[facility] ° [address],  Phone: (336) 832-3150, Fax: (336) 832-3151. Hours of Operation:  8:30 am - 5:30 pm, M-F.  Also accepts Medicaid and self-pay.  °[facility] [place] [address], [place] Phone: (336) 878-6027   °[facility] [address], [place], [place] (336)723-1848, Ext. 123 Mondays & Thursdays: 7-9 AM.  First 15 patients are seen on a first come, first serve basis. °  ° °Medicaid-accepting [place] Providers: ° °Organization         Address  Phone   Notes  °[facility] [address], [place] (336) 641-2100 Also accepts self-pay patients.  °[facility] [address], [place] ° (336) 856-9996   °[facility] [address], [place]  (336) 288-8857   °[facility] [address] [place] [address], [place] (336) 299-7000   °Veita Bland [address], [place]  ° (336) 373-1557 Only accepts [place] Access Medicaid patients after they have their name applied to their card.  ° °Self-Pay (no insurance) in [place]: ° °Organization         Address  Phone   Notes  °Sickle Cell Patients, [facility] [address], [place] (336) 832-1970   °[facility] [address], [place] (336) 832-4400   °[facility] ° [address], [place] (336) 992-4800   °[facility]/Dr. Osei-Bonsu ° [address] [place] [address], [place] or [address], [place] (336) 841-8500 Phone number for both [place] and [place] locations is the same.  °[facility] [address], [place] (336) 299-0000   °[facility] [address] [place] [address], [place] or [address] (336) 852-7530 °(336) 878-2260   °[facility] [address], [place] (336) 350-1642, phone; (336) 294-5005, fax Sees patients 1st and 3rd Saturday of every month.  Must not qualify for public or private insurance (i.e. Medicaid, Medicare, [place] Health Choice, Veterans' Benefits) • Household income should be no more than 200% of the poverty level •The clinic cannot treat you if you are pregnant or think you are pregnant • Sexually transmitted diseases are not treated at the clinic.  ° ° °Dental Care: °Organization         Address  Phone  Notes  °[place] [facility] [address], [place] (336) 641-6152 Accepts children up to age 21 who are enrolled in Medicaid or [place] Health Choice; pregnant women with a Medicaid card; and children who have applied for Medicaid or [place] Health Choice, but were declined, whose parents can pay a reduced fee at time of service.  °[place]  [facility] [place]  [address], [place] (336) 641-7733 Accepts children up to age 21 who are enrolled in Medicaid or [place] Health Choice; pregnant women with a Medicaid card; and children who have applied for Medicaid or [place] Health Choice, but were declined, whose parents can pay a reduced fee at time of service.  °[facility] ° [address],  (336) 641-4533 Patients are seen by appointment only. Walk-ins are not accepted. [facility] will see patients 18 years of age and older. °Monday - Tuesday (8am-5pm) °Most Wednesdays (8:30-5pm) °$30 per visit, cash only  °[facility] ° [address], [place] (336) 641-4533 Patients are seen by appointment only. Walk-ins are not accepted. [facility] will see patients 18 years of age and older. °One   Wednesday Evening (Monthly: Volunteer Based).  $30 per visit, cash only  °[facility]  (919) 537-3737 for adults; Children under age 4, call Graduate Pediatric Dentistry at (919) [phone]. Children aged 4-14, please call (919) [phone] to request a pediatric application. ° Dental services are provided in all areas of dental care including fillings, crowns and bridges, complete and partial dentures, implants, gum treatment, root canals, and extractions. Preventive care is also provided. Treatment is provided to both adults and children. °Patients are selected via a lottery and there is often a waiting list. °  °[facility] [address], °[place] ° (336) 763-8833 [email or web address] °  °[facility] [address], [place], [place] (336)723-1848, Ext. 123 Second and Fourth Thursday of each month, opens at 6:30 AM; Clinic ends at 9 AM.  Patients are seen on a first-come first-served basis, and a limited number are seen during each clinic.  ° °[facility] ° [address], [place], [place] (336) 723-7904    Eligibility Requirements °You must have lived in [place], [place], or [place] counties for at least the last three months. °  You cannot be eligible for state or federal sponsored healthcare insurance, including Veterans Administration, Medicaid, or Medicare. °  You generally cannot be eligible for healthcare insurance through your employer.  °  How to apply: °Eligibility screenings are held every Tuesday and Wednesday afternoon from 1:00 pm until 4:00 pm. You do not need an appointment for the interview!  °[facility] [address], [place], [place] 336-631-2330   °[facility]  336-342-8273   °[facility]  336-703-3100   °[facility]  336-570-6415   ° °Behavioral Health Resources in the Community: °Intensive Outpatient Programs °Organization         Address  Phone  Notes  °[facility] [address], [place], [place] 336-878-6098   °[facility] [address], [place], [place] 336-832-9800   °[facility] [address], [place], [place] ° 336-882-2125   °[facility] [address],  °[place], [place] 1-800-853-5163 or 336-641-4981   °Substance Abuse Resources °Organization         Address  Phone  Notes  °[facility]  336-882-2125   °[facility]  336-784-9470   °[facility]  336-285-9073   °[facility]  336-845-3988   °Residential & Outpatient Substance Abuse Program  1-800-659-3381   °Psychological Services °Organization         Address  Phone  Notes  °[facility]  336- 832-9600   °[facility]  336- 378-7881   °[facility] [address], [place] 1-800-853-5163 or 336-641-4981   ° °Mobile Crisis Teams °Organization         Address  Phone  Notes  °[facility], Mobile Crisis Care Unit  1-877-626-1772   °Assertive °[facility] ° [address]  [place], [place] 336-834-9664   °Sharon DeEsch [address] °[place] 336-554-5454   ° °Self-Help/Support Groups °Organization         Address  Phone             Notes  °[facility]  - variety of support groups  336- 373-1402 Call for more information  °Narcotics Anonymous (NA), [facility] [address], °[place]   2 meetings at this location  ° °  Residential Treatment Programs °Organization         Address  Phone  Notes  °[facility] [address],    °[place]  1-866-801-8205   °[facility] ° [address], [place], [place] 704-293-8524   °[facility] [address], [place] 336-845-3988 Admissions: 8am-3pm M-F  °[facility] [address]    °[place], [place] 336-841-1104   °[facility] [address], [place], [place] 336-379-7146   °[facility] [address]  °[place], [place] 336-285-9073   °[facility] Intensive Outpatient [address], [place], [place] 336-852-3033   °[facility] [address]  °[place], [place] 1-877-615-2722 or 336-784-9470   °[facility] [address] [place], [place] 336-227-7417 Accepts Medicaid  °[facility] [address]  °[place] 1-800-659-3381 Substance Abuse/Addiction Treatment  ° °[place] Behavioral Health Resources °Organization         Address  Phone  Notes  °[facility]  (888) 581-9988   °Julie Brannon, PhD [address] [place], [place]   (336) 349-5553 or (336) 951-0000   °[facility]   [address] °[place], [place] (336) 349-4454   °[facility] [address], [place], [place] (336) 342-8316 Insurance/Medicaid/sponsorship through [facility]  °[facility] [address]                                    [place], [place] (336) 342-8316 Therapy/tele-psych/case    °[facility] [address]  ° [place], [place] (336) 349-2233    °Dr. Arfeen  (336) 349-4544   °[facility] [place]  [employer] [place] [facility] 1) [address], [place] °2) [address], [place] °3)  [address], [place] (336) 349-3220 °(336) 342-7768 ° °(336) 342-8140   °[place] [facility] (336) 342-1394 or (336) 342-3537 (After Hours)    ° ° ° °Take the prescriptions as directed.  Apply moist heat or ice to the area(s) of discomfort, for 15 minutes at a time, several times per day for the next few days.  Do not fall asleep on a heating or ice pack.  Call your regular medical doctor today to schedule a follow up appointment this week.  Return to the Emergency Department immediately if worsening. ° °

## 2014-05-08 ENCOUNTER — Emergency Department (HOSPITAL_COMMUNITY)
Admission: EM | Admit: 2014-05-08 | Discharge: 2014-05-08 | Disposition: A | Discharge status: Home / Self Care | Payer: Self-pay | Attending: Emergency Medicine | Admitting: Emergency Medicine

## 2014-05-08 ENCOUNTER — Encounter (HOSPITAL_COMMUNITY): Payer: Self-pay | Admitting: Emergency Medicine

## 2014-05-08 DIAGNOSIS — Z791 Long term (current) use of non-steroidal anti-inflammatories (NSAID): Secondary | ICD-10-CM | POA: Insufficient documentation

## 2014-05-08 DIAGNOSIS — R51 Headache: Secondary | ICD-10-CM | POA: Insufficient documentation

## 2014-05-08 DIAGNOSIS — Z862 Personal history of diseases of the blood and blood-forming organs and certain disorders involving the immune mechanism: Secondary | ICD-10-CM | POA: Insufficient documentation

## 2014-05-08 DIAGNOSIS — Z7982 Long term (current) use of aspirin: Secondary | ICD-10-CM | POA: Insufficient documentation

## 2014-05-08 DIAGNOSIS — Z720 Tobacco use: Secondary | ICD-10-CM | POA: Insufficient documentation

## 2014-05-08 DIAGNOSIS — R000 Tachycardia, unspecified: Secondary | ICD-10-CM | POA: Insufficient documentation

## 2014-05-08 DIAGNOSIS — Z792 Long term (current) use of antibiotics: Secondary | ICD-10-CM | POA: Insufficient documentation

## 2014-05-08 DIAGNOSIS — L0231 Cutaneous abscess of buttock: Secondary | ICD-10-CM | POA: Insufficient documentation

## 2014-05-08 MED ORDER — SULFAMETHOXAZOLE-TRIMETHOPRIM 800-160 MG PO TABS
1.0000 | ORAL_TABLET | Freq: Once | ORAL | Status: AC
  Administered 2014-05-08: 1 via ORAL
  Filled 2014-05-08: qty 1

## 2014-05-08 MED ORDER — LIDOCAINE-EPINEPHRINE 1 %-1:100000 IJ SOLN
10.0000 mL | Freq: Once | INTRAMUSCULAR | Status: AC
  Administered 2014-05-08: 10 mL
  Filled 2014-05-08: qty 1

## 2014-05-08 MED ORDER — OXYCODONE-ACETAMINOPHEN 5-325 MG PO TABS: 1.0000 | ORAL_TABLET | ORAL | Status: DC | PRN

## 2014-05-08 MED ORDER — SULFAMETHOXAZOLE-TRIMETHOPRIM 800-160 MG PO TABS: 1.0000 | ORAL_TABLET | Freq: Two times a day (BID) | ORAL | Status: DC

## 2014-05-08 MED ORDER — IBUPROFEN 800 MG PO TABS
800.0000 mg | ORAL_TABLET | Freq: Once | ORAL | Status: AC
  Administered 2014-05-08: 800 mg via ORAL
  Filled 2014-05-08: qty 1

## 2014-05-08 NOTE — Discharge Instructions (Signed)

## 2014-05-08 NOTE — ED Notes (Signed)
MD at bedside. EDP KOHUT PRESENT 

## 2014-05-08 NOTE — ED Notes (Signed)
Pt w/ odor of marijuana c/o large abscess to "left butt cheek".  Pt states that she has been feeling feverish and nauseated since then (2 or 3 days).  HR elevated in triage.

## 2014-05-09 NOTE — ED Provider Notes (Signed)
CSN: [id number]     Arrival date & time 05/08/14  1012 History   First MD Initiated Contact with Patient 05/08/14 1042     Chief Complaint  Patient presents with  . Abscess  . Nausea  . Headache     (Consider location/radiation/quality/duration/timing/severity/associated sxs/prior Treatment) HPI   33 year old female with a painful lesion to her left buttock. Patient has a past history of "boils." She notices lesion approximately 3 days ago and has progressively become larger and more painful. Today body aches and a mild headache. Subjective fever. No chills. No dizziness or lightheadedness. Nausea. No vomiting. Denies any history of diabetes. No history of chronic steroid use or other obvious immunocompromising factors.  Past Medical History  Diagnosis Date  . Anemia   . Low back pain    Past Surgical History  Procedure Laterality Date  . Cesarean section  2003, 2004, 2009   No family history on file. History  Substance Use Topics  . Smoking status: Current Every Day Smoker  . Smokeless tobacco: Not on file  . Alcohol Use: No   OB History    No data available     Review of Systems  All systems reviewed and negative, other than as noted in HPI.   Allergies  Review of patient's allergies indicates no known allergies.  Home Medications   Prior to Admission medications   Medication Sig Start Date End Date Taking? Authorizing Provider  aspirin 325 MG tablet Take 650 mg by mouth every 4 (four) hours as needed for mild pain or headache.   Yes Historical Provider, MD  cephALEXin (KEFLEX) 500 MG capsule Take 1 capsule (500 mg total) by mouth 4 (four) times daily. Patient not taking: Reported on 05/08/2014 09/24/13   Samuel JesterKathleen McManus, DO  HYDROcodone-acetaminophen (NORCO/VICODIN) 5-325 MG per tablet 1 or 2 tabs PO q6 hours prn pain Patient not taking: Reported on 05/08/2014 09/24/13   Samuel JesterKathleen McManus, DO  methocarbamol (ROBAXIN) 500 MG tablet Take 2 tablets (1,000 mg total) by  mouth 4 (four) times daily as needed for muscle spasms (muscle spasm/pain). Patient not taking: Reported on 05/08/2014 09/24/13   Samuel JesterKathleen McManus, DO  naproxen (NAPROSYN) 250 MG tablet Take 1 tablet (250 mg total) by mouth 2 (two) times daily with a meal. Patient not taking: Reported on 05/08/2014 09/24/13   Samuel JesterKathleen McManus, DO  oxyCODONE-acetaminophen (PERCOCET/ROXICET) 5-325 MG per tablet Take 1 tablet by mouth every 4 (four) hours as needed for severe pain. 05/08/14   Raeford RazorStephen Onnie Hatchel, MD  sulfamethoxazole-trimethoprim (SEPTRA DS) 800-160 MG per tablet Take 1 tablet by mouth every 12 (twelve) hours. 05/08/14   Raeford RazorStephen Denette Hass, MD   BP 147/72 mmHg  Pulse 98  Temp(Src) 102.2 F (39 C) (Oral)  Resp 20  Ht 5\' 8"  (1.727 m)  Wt 232 lb (105.235 kg)  BMI 35.28 kg/m2  SpO2 100% Physical Exam  Constitutional: She appears well-developed and well-nourished. No distress.  HENT:  Head: Normocephalic and atraumatic.  Eyes: Conjunctivae are normal. Right eye exhibits no discharge. Left eye exhibits no discharge.  Neck: Neck supple.  Cardiovascular: Regular rhythm and normal heart sounds.  Exam reveals no gallop and no friction rub.   No murmur heard. Mild tachcyardia  Pulmonary/Chest: Effort normal and breath sounds normal. No respiratory distress.  Abdominal: Soft. She exhibits no distension. There is no tenderness.  Musculoskeletal: She exhibits no edema or tenderness.  Neurological: She is alert.  Skin: Skin is warm and dry.  spontaneously draining abscess to L buttock,  several cm from anus. Surrounding induration extending laterally. Mild cellulitic changes.   Psychiatric: She has a normal mood and affect. Her behavior is normal. Thought content normal.  Nursing note and vitals reviewed.   ED Course  Procedures (including critical care time)  INCISION AND DRAINAGE Performed by: Raeford RazorKOHUT, Malayzia Laforte Consent: Verbal consent obtained. Risks and benefits: risks, benefits and alternatives were discussed.  Chaperone present.  Type: abscess  Body area: L buttock Anesthesia: local infiltration  Incision was made with a scalpel.  Local anesthetic: lidocaine 2 % w epinephrine  Anesthetic total: 4 ml  Complexity: complex Blunt dissection to break up loculations  Drainage: purulent, malodorous  Drainage amount: large  Packing material: n/a  Patient tolerance: Patient tolerated the procedure well with no immediate complications.    Labs Review Labs Reviewed - No data to display  Imaging Review No results found.   EKG Interpretation None      MDM   Final diagnoses:  Abscess of buttock, left    33 year old female with abscess to her left buttock. Incised and drained. Perhaps some mild surrounding cellulitis. She additionally is febrile. For these reasons she was placed on antibiotics. Continued wound care return precautions were discussed.    Raeford RazorStephen Sian Joles, MD 05/09/14 1000

## 2014-05-11 ENCOUNTER — Emergency Department (HOSPITAL_COMMUNITY): Payer: Self-pay

## 2014-05-11 ENCOUNTER — Encounter (HOSPITAL_COMMUNITY): Payer: Self-pay | Admitting: *Deleted

## 2014-05-11 ENCOUNTER — Inpatient Hospital Stay (HOSPITAL_COMMUNITY)
Admission: AD | Admit: 2014-05-11 | Discharge: 2014-05-11 | Disposition: A | Discharge status: Home / Self Care | Payer: Self-pay | Source: Ambulatory Visit | Attending: Family Medicine | Admitting: Family Medicine

## 2014-05-11 ENCOUNTER — Encounter (HOSPITAL_COMMUNITY): Payer: Self-pay

## 2014-05-11 ENCOUNTER — Emergency Department (HOSPITAL_COMMUNITY)
Admission: EM | Admit: 2014-05-11 | Discharge: 2014-05-11 | Disposition: A | Discharge status: Home / Self Care | Payer: Self-pay | Attending: Emergency Medicine | Admitting: Emergency Medicine

## 2014-05-11 DIAGNOSIS — F17200 Nicotine dependence, unspecified, uncomplicated: Secondary | ICD-10-CM | POA: Insufficient documentation

## 2014-05-11 DIAGNOSIS — Z720 Tobacco use: Secondary | ICD-10-CM | POA: Insufficient documentation

## 2014-05-11 DIAGNOSIS — L03317 Cellulitis of buttock: Secondary | ICD-10-CM | POA: Insufficient documentation

## 2014-05-11 DIAGNOSIS — Z8739 Personal history of other diseases of the musculoskeletal system and connective tissue: Secondary | ICD-10-CM | POA: Insufficient documentation

## 2014-05-11 DIAGNOSIS — Z862 Personal history of diseases of the blood and blood-forming organs and certain disorders involving the immune mechanism: Secondary | ICD-10-CM | POA: Insufficient documentation

## 2014-05-11 DIAGNOSIS — K611 Rectal abscess: Secondary | ICD-10-CM | POA: Insufficient documentation

## 2014-05-11 DIAGNOSIS — Z3202 Encounter for pregnancy test, result negative: Secondary | ICD-10-CM | POA: Insufficient documentation

## 2014-05-11 DIAGNOSIS — L0231 Cutaneous abscess of buttock: Secondary | ICD-10-CM | POA: Insufficient documentation

## 2014-05-11 LAB — BASIC METABOLIC PANEL
Anion gap: 13 (ref 5–15)
BUN: 13 mg/dL (ref 6–23)
CO2: 21 mEq/L (ref 19–32)
Calcium: 9.4 mg/dL (ref 8.4–10.5)
Chloride: 97 mEq/L (ref 96–112)
Creatinine, Ser: 1.32 mg/dL — ABNORMAL HIGH (ref 0.50–1.10)
GFR, EST AFRICAN AMERICAN: 61 mL/min — AB (ref >90)
GFR, EST NON AFRICAN AMERICAN: 53 mL/min — AB (ref >90)
Glucose, Bld: 93 mg/dL (ref 70–99)
Potassium: 4 mEq/L (ref 3.7–5.3)
SODIUM: 131 meq/L — AB (ref 137–147)

## 2014-05-11 LAB — CBC WITH DIFFERENTIAL/PLATELET
BASOS ABS: 0.1 10*3/uL (ref 0.0–0.1)
Basophils Relative: 1 % (ref 0–1)
EOS ABS: 0.2 10*3/uL (ref 0.0–0.7)
Eosinophils Relative: 2 % (ref 0–5)
HCT: 34.1 % — ABNORMAL LOW (ref 36.0–46.0)
Hemoglobin: 11.1 g/dL — ABNORMAL LOW (ref 12.0–15.0)
LYMPHS PCT: 18 % (ref 12–46)
Lymphs Abs: 2.2 10*3/uL (ref 0.7–4.0)
MCH: 29.1 pg (ref 26.0–34.0)
MCHC: 32.6 g/dL (ref 30.0–36.0)
MCV: 89.5 fL (ref 78.0–100.0)
Monocytes Absolute: 1.7 10*3/uL — ABNORMAL HIGH (ref 0.1–1.0)
Monocytes Relative: 14 % — ABNORMAL HIGH (ref 3–12)
NEUTROS PCT: 65 % (ref 43–77)
Neutro Abs: 8.2 10*3/uL — ABNORMAL HIGH (ref 1.7–7.7)
Platelets: 151 10*3/uL (ref 150–400)
RBC: 3.81 MIL/uL — ABNORMAL LOW (ref 3.87–5.11)
RDW: 12.7 % (ref 11.5–15.5)
WBC: 12.4 10*3/uL — ABNORMAL HIGH (ref 4.0–10.5)

## 2014-05-11 LAB — URINALYSIS, ROUTINE W REFLEX MICROSCOPIC
BILIRUBIN URINE: NEGATIVE
GLUCOSE, UA: NEGATIVE mg/dL
KETONES UR: NEGATIVE mg/dL
Nitrite: NEGATIVE
PROTEIN: NEGATIVE mg/dL
Specific Gravity, Urine: 1.01 (ref 1.005–1.030)
Urobilinogen, UA: 1 mg/dL (ref 0.0–1.0)
pH: 6 (ref 5.0–8.0)

## 2014-05-11 LAB — URINE MICROSCOPIC-ADD ON

## 2014-05-11 LAB — POC URINE PREG, ED: PREG TEST UR: NEGATIVE

## 2014-05-11 LAB — I-STAT CG4 LACTIC ACID, ED: Lactic Acid, Venous: 0.91 mmol/L (ref 0.5–2.2)

## 2014-05-11 LAB — POCT PREGNANCY, URINE: PREG TEST UR: NEGATIVE

## 2014-05-11 MED ORDER — IOHEXOL 300 MG/ML  SOLN
50.0000 mL | Freq: Once | INTRAMUSCULAR | Status: AC | PRN
  Administered 2014-05-11: 50 mL via ORAL

## 2014-05-11 MED ORDER — IOHEXOL 300 MG/ML  SOLN: 50.0000 mL | Freq: Once | INTRAMUSCULAR | Status: DC | PRN

## 2014-05-11 MED ORDER — IOHEXOL 300 MG/ML  SOLN
100.0000 mL | Freq: Once | INTRAMUSCULAR | Status: DC | PRN
Start: 2014-05-11 — End: 2014-05-11

## 2014-05-11 NOTE — MAU Provider Note (Signed)
History     CSN: [id number]  Arrival date and time: 05/11/14 [id number]   First Provider Initiated Contact with Patient 05/11/14 [phone]      Chief Complaint  Patient presents with  . Abcess on Buttock    HPI  [name] is a 33 y.o. [phone] [id number] who presents to MAU today with complaint of abscess and cellulitis of the left buttocks. She states that abscess first appears on 05/07/14 and became worse on 05/09/14. [facility] and had I&D of abscess on 05/09/14. She states that since 05/09/14 she has noted continued drainage and worsening of the pain. She was started on Bactrim due to cellulitis and fever at the time of visit. She states that she is taking the antibiotic. She denies any fevers since then. She states increased swelling and pain.   OB History    Gravida Para Term Preterm AB TAB SAB Ectopic Multiple Living   3 3 3       3       Past Medical History  Diagnosis Date  . Anemia   . Low back pain     Past Surgical History  Procedure Laterality Date  . Cesarean section  2003, 2004, 2009    History reviewed. No pertinent family history.  History  Substance Use Topics  . Smoking status: Current Every Day Smoker  . Smokeless tobacco: Never Used  . Alcohol Use: No    Allergies: No Known Allergies  Prescriptions prior to admission  Medication Sig Dispense Refill Last Dose  . aspirin 325 MG tablet Take 650 mg by mouth every 4 (four) hours as needed for mild pain or headache.   05/07/2014 at 2100  . cephALEXin (KEFLEX) 500 MG capsule Take 1 capsule (500 mg total) by mouth 4 (four) times daily. (Patient not taking: Reported on 05/08/2014) 40 capsule 0 Completed Course at Unknown time  . HYDROcodone-acetaminophen (NORCO/VICODIN) 5-325 MG per tablet 1 or 2 tabs PO q6 hours prn pain (Patient not taking: Reported on 05/08/2014) 20 tablet 0 Not Taking at Unknown time  . methocarbamol (ROBAXIN) 500 MG tablet Take 2 tablets (1,000 mg total) by mouth 4 (four) times daily as needed for  muscle spasms (muscle spasm/pain). (Patient not taking: Reported on 05/08/2014) 25 tablet 0 Not Taking at Unknown time  . naproxen (NAPROSYN) 250 MG tablet Take 1 tablet (250 mg total) by mouth 2 (two) times daily with a meal. (Patient not taking: Reported on 05/08/2014) 14 tablet 0 Not Taking at Unknown time  . oxyCODONE-acetaminophen (PERCOCET/ROXICET) 5-325 MG per tablet Take 1 tablet by mouth every 4 (four) hours as needed for severe pain. 8 tablet 0   . sulfamethoxazole-trimethoprim (SEPTRA DS) 800-160 MG per tablet Take 1 tablet by mouth every 12 (twelve) hours. 14 tablet 0     Review of Systems  Constitutional: Negative for fever and malaise/fatigue.   Physical Exam   Blood pressure 120/68, pulse 105, temperature 98.8 F (37.1 C), temperature source Oral, resp. rate 20, height 5' 7.5" (1.715 m), weight 246 lb 4 oz (111.698 kg), last menstrual period 04/20/2014.  Physical Exam  Constitutional: She is oriented to person, place, and time. She appears well-developed and well-nourished. No distress.  HENT:  Head: Normocephalic.  Cardiovascular: Normal rate.   Respiratory: Effort normal.  Genitourinary:  Actively draining abscess proximal to the rectum with copious amounts of foul-smelling serosanguinous drainage noted. Large area of erythema extending across a large part of the left buttocks. Tender to palpation without  fluctuance.   Neurological: She is alert and oriented to person, place, and time.  Skin: Skin is warm and dry. No erythema.  Psychiatric: She has a normal mood and affect.   Results for orders placed or performed during the hospital encounter of 05/11/14 (from the past 24 hour(s))  Pregnancy, urine POC     Status: None   Collection Time: 05/11/14  9:56 AM  Result Value Ref Range   Preg Test, Ur NEGATIVE NEGATIVE    MAU Course  Procedures None  MDM UPT - negative  Assessment and Plan  A: Abscess with surrounding cellulitis of the buttocks, possible peri-rectal  abscess   P: Discharge home Patient [facility] and [facility]. Patient voiced understanding Patient may return to MAU as needed or if her condition were to change or worsen   Marny LowensteinJulie N Wenzel, PA-C  05/11/2014, 9:59 AM

## 2014-05-11 NOTE — ED Notes (Signed)
Pt attempting to void in nearby restroom at present time. 

## 2014-05-11 NOTE — ED Notes (Signed)
Pt c/o abscess on L buttocks.  Pain score 10/10.  Pt reports she was seen for same x 3 days ago, but pain has increased.  Sts she has been taking all prescribed medication.

## 2014-05-11 NOTE — Discharge Instructions (Signed)
Peri-Rectal Abscess  Your caregiver has diagnosed you as having a peri-rectal abscess. This is an infected area near the rectum that is filled with pus. If the abscess is near the surface of the skin, your caregiver may open (incise) the area and drain the pus.  HOME CARE INSTRUCTIONS    If your abscess was opened up and drained. A small piece of gauze may be placed in the opening so that it can drain. Do not remove the gauze unless directed by your caregiver.   A loose dressing may be placed over the abscess site. Change the dressing as often as necessary to keep it clean and dry.   After the drain is removed, the area may be washed with a gentle antiseptic (soap) four times per day.   A warm sitz bath, warm packs or heating pad may be used for pain relief, taking care not to burn yourself.   Return for a wound check in 1 day or as directed.   An "inflatable doughnut" may be used for sitting with added comfort. These can be purchased at a drugstore or medical supply house.   To reduce pain and straining with bowel movements, eat a high fiber diet with plenty of fruits and vegetables. Use stool softeners as recommended by your caregiver. This is especially important if narcotic type pain medications were prescribed as these may cause marked constipation.   Only take over-the-counter or prescription medicines for pain, discomfort, or fever as directed by your caregiver.  SEEK IMMEDIATE MEDICAL CARE IF:    You have increasing pain that is not controlled by medication.   There is increased inflammation (redness), swelling, bleeding, or drainage from the area.   An oral temperature above 102 F (38.9 C) develops.   You develop chills or generalized malaise (feel lethargic or feel "washed out").   You develop any new symptoms (problems) you feel may be related to your present problem.  Document Released: 05/21/2000 Document Revised: 08/16/2011 Document Reviewed: 05/21/2008  [employer] Patient Information  2015 [employer]. This information is not intended to replace advice given to you by your health care provider. Make sure you discuss any questions you have with your health care provider.

## 2014-05-11 NOTE — ED Provider Notes (Signed)
CSN: [id number]     Arrival date & time 05/11/14  1105 History   First MD Initiated Contact with Patient 05/11/14 1139     Chief Complaint  Patient presents with  . Abscess     (Consider location/radiation/quality/duration/timing/severity/associated sxs/prior Treatment) HPI Comments: Patient presents to the ER for evaluation of painful abscess on left buttock. Patient reports that she was seen in the ER 3 days ago for this and had the area drained. She reports that since then she has had progressive worsening of pain in the area. It continues to drain. Patient reports that the pain today is worse than she has had at any time, including with the drainage. She had a fever earlier in the week, but none since.  Patient is a 33 y.o. female presenting with abscess.  Abscess   Past Medical History  Diagnosis Date  . Anemia   . Low back pain    Past Surgical History  Procedure Laterality Date  . Cesarean section  2003, 2004, 2009   History reviewed. No pertinent family history. History  Substance Use Topics  . Smoking status: Current Every Day Smoker  . Smokeless tobacco: Never Used  . Alcohol Use: No   OB History    Gravida Para Term Preterm AB TAB SAB Ectopic Multiple Living   3 3 3       3      Review of Systems  Skin: Positive for wound.  All other systems reviewed and are negative.     Allergies  Review of patient's allergies indicates no known allergies.  Home Medications   Prior to Admission medications   Medication Sig Start Date End Date Taking? Authorizing Provider  levonorgestrel (MIRENA) 20 MCG/24HR IUD 1 each by Intrauterine route once.   Yes Historical Provider, MD  oxyCODONE-acetaminophen (PERCOCET/ROXICET) 5-325 MG per tablet Take 1 tablet by mouth every 4 (four) hours as needed for severe pain.   Yes Historical Provider, MD  sulfamethoxazole-trimethoprim (BACTRIM DS,SEPTRA DS) 800-160 MG per tablet Take 1 tablet by mouth every 12 (twelve) hours. For 7  days,  Started with 1 dose on 05-09-14 05/09/14 05/17/14 Yes Historical Provider, MD   BP 148/91 mmHg  Pulse 83  Temp(Src) 98.7 F (37.1 C) (Oral)  Resp 16  SpO2 100%  LMP 04/20/2014 Physical Exam  Constitutional: She is oriented to person, place, and time. She appears well-developed and well-nourished. No distress.  HENT:  Head: Normocephalic and atraumatic.  Right Ear: Hearing normal.  Left Ear: Hearing normal.  Nose: Nose normal.  Mouth/Throat: Oropharynx is clear and moist and mucous membranes are normal.  Eyes: Conjunctivae and EOM are normal. Pupils are equal, round, and reactive to light.  Neck: Normal range of motion. Neck supple.  Cardiovascular: Regular rhythm, S1 normal and S2 normal.  Exam reveals no gallop and no friction rub.   No murmur heard. Pulmonary/Chest: Effort normal and breath sounds normal. No respiratory distress. She exhibits no tenderness.  Abdominal: Soft. Normal appearance and bowel sounds are normal. There is no hepatosplenomegaly. There is no tenderness. There is no rebound, no guarding, no tenderness at McBurney's point and negative Murphy's sign. No hernia.  Musculoskeletal: Normal range of motion.  Neurological: She is alert and oriented to person, place, and time. She has normal strength. No cranial nerve deficit or sensory deficit. Coordination normal. GCS eye subscore is 4. GCS verbal subscore is 5. GCS motor subscore is 6.  Skin: Skin is warm, dry and intact. No rash noted. No cyanosis.  Psychiatric: She has a normal mood and affect. Her speech is normal and behavior is normal. Thought content normal.  Nursing note and vitals reviewed.   ED Course  Procedures (including critical care time) Labs Review Labs Reviewed  CBC WITH DIFFERENTIAL - Abnormal; Notable for the following:    WBC 12.4 (*)    RBC 3.81 (*)    Hemoglobin 11.1 (*)    HCT 34.1 (*)    Monocytes Relative 14 (*)    Neutro Abs 8.2 (*)    Monocytes Absolute 1.7 (*)    All  other components within normal limits  BASIC METABOLIC PANEL - Abnormal; Notable for the following:    Sodium 131 (*)    Creatinine, Ser 1.32 (*)    GFR calc non Af Amer 53 (*)    GFR calc Af Amer 61 (*)    All other components within normal limits  URINALYSIS, ROUTINE W REFLEX MICROSCOPIC - Abnormal; Notable for the following:    Hgb urine dipstick TRACE (*)    Leukocytes, UA TRACE (*)    All other components within normal limits  CULTURE, ROUTINE-ABSCESS  URINE MICROSCOPIC-ADD ON  I-STAT CG4 LACTIC ACID, ED  POC URINE PREG, ED    Imaging Review Ct Pelvis W Contrast  05/11/2014   CLINICAL DATA:  [age] year old female with lesion on the right inter- buttock lanced on 05/08/2014 with purulent yellow drainage, complaining of pain at the site of drainage.  EXAM: CT PELVIS WITH CONTRAST  TECHNIQUE: Multidetector CT imaging of the pelvis was performed using the standard protocol following the bolus administration of intravenous contrast.  CONTRAST:  50mL OMNIPAQUE IOHEXOL 300 MG/ML  SOLN  COMPARISON:  None.  FINDINGS: Pelvis: IUD present in the uterus. Left ovary is unremarkable in appearance. Multiple low-attenuation lesions in the right ovary, largest of which measures 3.6 x 4.4 x 3.6 cm, presumably an ovarian cyst. Trace volume of free fluid in the cul-de-sac, presumably physiologic in this young female patient. Multiple enlarged left inguinal lymph nodes, presumably reactive, largest of which measures 17 mm in short axis.  Musculoskeletal: Extensive stranding in the subcutaneous fat in the medial aspect of the left thigh and buttock, presumably inflammation at site of recently drained abscess. No discrete fluid collection identified at this time to suggest residual abscess. There are no aggressive appearing lytic or blastic lesions noted in the visualized portions of the skeleton.  IMPRESSION: 1. Small amount of inflammatory changes in the subcutaneous fat in the medial aspect of the upper left thigh  and buttock, presumably related to recent incision and drainage of abscess. No residual abscess identified at this time. 2. Additional incidental findings, as above.   Electronically Signed   By: Trudie Reedaniel  Entrikin M.D.   On: 05/11/2014 15:51     EKG Interpretation None      MDM   Abscess, Left buttock  Patient presents to the ER for evaluation of abscess. Patient had an abscess of her left buttock drained 3 days ago. Patient reports that she is having increasing pain. She does admit, however, that she has not been taking the pain medication because it makes her sleepy. She reports continuous drainage from the area. Examination did reveal the incision and drainage site with surrounding induration, minimal erythema. Only slight drainage. A CT scan was performed to rule out enlarging abscess and pelvic abscess. There are inflammatory changes in the subcutaneous tissues consistent with the recent incision and drainage, but no reaccumulation of fluid or abscess. Patient reassured,  continue to Bactrim. Utilize oxycodone as prescribed as needed.    Gilda Creasehristopher J. Lilias Lorensen, MD 05/11/14 1600

## 2014-05-11 NOTE — ED Notes (Addendum)
Pt has lesion to right inner buttocks; lanced on 05/08/14. Pt reports took medications as prescribed but has not taken anything for pain today. Upon assessment lance site opening with yellow drainage. Surrounding area hot and skin shiny in color. Pt complaint of pain.

## 2014-05-11 NOTE — ED Notes (Signed)
Pt unable to void 

## 2014-05-11 NOTE — ED Notes (Signed)
MD at bedside. 

## 2014-05-11 NOTE — Discharge Instructions (Signed)

## 2014-05-11 NOTE — MAU Note (Signed)
Patient presents with complaint of an abscess on her left buttock since Sunday.

## 2014-05-15 LAB — CULTURE, ROUTINE-ABSCESS

## 2014-09-26 ENCOUNTER — Emergency Department (HOSPITAL_COMMUNITY): Payer: Self-pay

## 2014-09-26 ENCOUNTER — Encounter (HOSPITAL_COMMUNITY): Payer: Self-pay | Admitting: Emergency Medicine

## 2014-09-26 ENCOUNTER — Emergency Department (HOSPITAL_COMMUNITY)
Admission: EM | Admit: 2014-09-26 | Discharge: 2014-09-26 | Disposition: A | Discharge status: Home / Self Care | Payer: Self-pay | Attending: Emergency Medicine | Admitting: Emergency Medicine

## 2014-09-26 DIAGNOSIS — Z862 Personal history of diseases of the blood and blood-forming organs and certain disorders involving the immune mechanism: Secondary | ICD-10-CM | POA: Insufficient documentation

## 2014-09-26 DIAGNOSIS — E663 Overweight: Secondary | ICD-10-CM | POA: Insufficient documentation

## 2014-09-26 DIAGNOSIS — Z720 Tobacco use: Secondary | ICD-10-CM | POA: Insufficient documentation

## 2014-09-26 DIAGNOSIS — R0789 Other chest pain: Secondary | ICD-10-CM | POA: Insufficient documentation

## 2014-09-26 LAB — BASIC METABOLIC PANEL
Anion gap: 11 (ref 5–15)
BUN: 10 mg/dL (ref 6–23)
CALCIUM: 9.1 mg/dL (ref 8.4–10.5)
CO2: 22 mmol/L (ref 19–32)
CREATININE: 0.98 mg/dL (ref 0.50–1.10)
Chloride: 106 mmol/L (ref 96–112)
GFR calc Af Amer: 87 mL/min — ABNORMAL LOW (ref >90)
GFR calc non Af Amer: 75 mL/min — ABNORMAL LOW (ref >90)
Glucose, Bld: 100 mg/dL — ABNORMAL HIGH (ref 70–99)
Potassium: 3.9 mmol/L (ref 3.5–5.1)
SODIUM: 139 mmol/L (ref 135–145)

## 2014-09-26 LAB — CBC
HCT: 38.9 % (ref 36.0–46.0)
Hemoglobin: 12.7 g/dL (ref 12.0–15.0)
MCH: 30 pg (ref 26.0–34.0)
MCHC: 32.6 g/dL (ref 30.0–36.0)
MCV: 91.7 fL (ref 78.0–100.0)
Platelets: 151 10*3/uL (ref 150–400)
RBC: 4.24 MIL/uL (ref 3.87–5.11)
RDW: 13.3 % (ref 11.5–15.5)
WBC: 8.5 10*3/uL (ref 4.0–10.5)

## 2014-09-26 LAB — I-STAT TROPONIN, ED: Troponin i, poc: 0 ng/mL (ref 0.00–0.08)

## 2014-09-26 LAB — D-DIMER, QUANTITATIVE (NOT AT ARMC)

## 2014-09-26 MED ORDER — KETOROLAC TROMETHAMINE 60 MG/2ML IM SOLN
60.0000 mg | Freq: Once | INTRAMUSCULAR | Status: AC
  Administered 2014-09-26: 60 mg via INTRAMUSCULAR
  Filled 2014-09-26: qty 2

## 2014-09-26 MED ORDER — IBUPROFEN 800 MG PO TABS: 800.0000 mg | ORAL_TABLET | Freq: Four times a day (QID) | ORAL | Status: DC | PRN

## 2014-09-26 MED ORDER — OXYCODONE-ACETAMINOPHEN 5-325 MG PO TABS
1.0000 | ORAL_TABLET | Freq: Once | ORAL | Status: AC
  Administered 2014-09-26: 1 via ORAL
  Filled 2014-09-26: qty 1

## 2014-09-26 MED ORDER — OXYCODONE-ACETAMINOPHEN 5-325 MG PO TABS: 1.0000 | ORAL_TABLET | Freq: Four times a day (QID) | ORAL | Status: DC | PRN

## 2014-09-26 NOTE — ED Provider Notes (Signed)
CSN: [id number]     Arrival date & time 09/26/14  0245 History  This chart was scribed for Shon Batonourtney F Horton, MD by Annye AsaAnna Dorsett, ED Scribe. This patient was seen in room A02C/A02C and the patient's care was started at 2:59 AM.    Chief Complaint  Patient presents with  . Chest Pain   Patient is a 34 y.o. female presenting with chest pain. The history is provided by the patient. No language interpreter was used.  Chest Pain Associated symptoms: no abdominal pain, no back pain, no cough, no fever, no headache, no nausea, no shortness of breath and not vomiting      HPI Comments: [name] is a 34 y.o. female who presents to the Emergency Department complaining of mid-sternal chest pain, radiating to her back, described as "tightness," rated 10/10 at present. Her pain is exacerbated with applied pressure and deep breathing and improves with applied heat. She also reports SOB and difficulty swallowing.. She denies nausea, vomiting, diarrhea, cough, fever, lower extremity swelling, history of blood clots. She reports a familial history of DM, HTN, HLD; she denies a personal history of DM, HTN, HLD, she denies a familial history of early cardiac death.   Patient as a Mirena IUD.   Past Medical History  Diagnosis Date  . Anemia   . Low back pain    Past Surgical History  Procedure Laterality Date  . Cesarean section  2003, 2004, 2009   No family history on file. History  Substance Use Topics  . Smoking status: Current Every Day Smoker  . Smokeless tobacco: Never Used  . Alcohol Use: No   OB History    Gravida Para Term Preterm AB TAB SAB Ectopic Multiple Living   3 3 3       3      Review of Systems  Constitutional: Negative for fever.  Respiratory: Positive for chest tightness. Negative for cough and shortness of breath.   Cardiovascular: Positive for chest pain. Negative for leg swelling.  Gastrointestinal: Negative for nausea, vomiting and abdominal pain.  Genitourinary:  Negative for dysuria.  Musculoskeletal: Negative for back pain.  Neurological: Negative for headaches.  All other systems reviewed and are negative.     Allergies  Review of patient's allergies indicates no known allergies.  Home Medications   Prior to Admission medications   Medication Sig Start Date End Date Taking? Authorizing Provider  ibuprofen (ADVIL,MOTRIN) 800 MG tablet Take 1 tablet (800 mg total) by mouth every 6 (six) hours as needed. 09/26/14   Shon Batonourtney F Horton, MD  levonorgestrel (MIRENA) 20 MCG/24HR IUD 1 each by Intrauterine route once.    Historical Provider, MD  oxyCODONE-acetaminophen (PERCOCET/ROXICET) 5-325 MG per tablet Take 1 tablet by mouth every 6 (six) hours as needed for severe pain. 09/26/14   Shon Batonourtney F Horton, MD   BP 108/46 mmHg  Pulse 68  Temp(Src) 98.1 F (36.7 C) (Oral)  Resp 20  SpO2 98%  LMP 09/16/2014 Physical Exam  Constitutional: She is oriented to person, place, and time. She appears well-developed and well-nourished. No distress.  Overweight  HENT:  Head: Normocephalic and atraumatic.  Cardiovascular: Normal rate, regular rhythm and normal heart sounds.   No murmur heard. Pulmonary/Chest: Effort normal and breath sounds normal. No respiratory distress. She has no wheezes. She exhibits tenderness.  Abdominal: Soft. Bowel sounds are normal. There is no tenderness. There is no rebound.  Musculoskeletal: She exhibits no edema.  Neurological: She is alert and oriented to  person, place, and time.  Skin: Skin is warm and dry.  Psychiatric: She has a normal mood and affect.  Nursing note and vitals reviewed.   ED Course  Procedures   DIAGNOSTIC STUDIES: Oxygen Saturation is 98% on RA, normal by my interpretation.    COORDINATION OF CARE: 3:17 AM Discussed treatment plan with pt at bedside and pt agreed to plan.  Labs Review Labs Reviewed  BASIC METABOLIC PANEL - Abnormal; Notable for the following:    Glucose, Bld 100 (*)    GFR  calc non Af Amer 75 (*)    GFR calc Af Amer 87 (*)    All other components within normal limits  CBC  D-DIMER, QUANTITATIVE  I-STAT TROPOININ, ED    Imaging Review Dg Chest 2 View  09/26/2014   CLINICAL DATA:  Chest and back pain.  EXAM: CHEST  2 VIEW  COMPARISON:  09/24/2013  FINDINGS: The heart size and mediastinal contours are within normal limits. Both lungs are clear. No pleural effusion or pneumothorax. Mild curvature of the spine.  IMPRESSION: No radiographic evidence of active cardiopulmonary disease.   Electronically Signed   By: Jearld Lesch M.D.   On: 09/26/2014 03:54     EKG Interpretation   Date/Time:  Thursday September 26 2014 02:52:01 EDT Ventricular Rate:  72 PR Interval:  142 QRS Duration: 84 QT Interval:  394 QTC Calculation: 431 R Axis:   74 Text Interpretation:  Normal sinus rhythm Normal ECG Confirmed by HORTON   MD, COURTNEY ([id number]) on 09/26/2014 3:47:53 AM      MDM   Final diagnoses:  Chest wall pain   Patient presents with chest pain. Nontoxic-appearing on exam. Chest pain is reproducible and suspect musculoskeletal pain. However, patient is on birth control. Otherwise low risk for PE. Screening d-dimer sent. EKG reassuring and chest x-ray without evidence of pneumonia or pneumothorax. Troponin and d-dimer negative. Patient given IM Toradol and Percocet with improvement of her pain. Discuss with patient supportive care at home with ibuprofen and patient will be given a short course of narcotic pain medication. Patient stated understanding.  After history, exam, and medical workup I feel the patient has been appropriately medically screened and is safe for discharge home. Pertinent diagnoses were discussed with the patient. Patient was given return precautions.  I personally performed the services described in this documentation, which was scribed in my presence. The recorded information has been reviewed and is accurate.      Shon Baton,  MD 09/26/14 [phone]

## 2014-09-26 NOTE — ED Notes (Signed)
Pt c/o mid sternal cp that started last night. Pt went to work today but tonight pain became worse. Pain radiates to back and in her throat. sts it is painful to breathe.

## 2014-09-26 NOTE — Discharge Instructions (Signed)

## 2015-01-07 ENCOUNTER — Inpatient Hospital Stay (HOSPITAL_COMMUNITY)
Admission: AD | Admit: 2015-01-07 | Discharge: 2015-01-07 | Disposition: A | Discharge status: Home / Self Care | Payer: Self-pay | Source: Ambulatory Visit | Attending: Family Medicine | Admitting: Family Medicine

## 2015-01-07 DIAGNOSIS — N390 Urinary tract infection, site not specified: Secondary | ICD-10-CM | POA: Insufficient documentation

## 2015-01-07 DIAGNOSIS — F17210 Nicotine dependence, cigarettes, uncomplicated: Secondary | ICD-10-CM | POA: Insufficient documentation

## 2015-01-07 DIAGNOSIS — N3001 Acute cystitis with hematuria: Secondary | ICD-10-CM

## 2015-01-07 LAB — URINALYSIS, ROUTINE W REFLEX MICROSCOPIC
BILIRUBIN URINE: NEGATIVE
Glucose, UA: NEGATIVE mg/dL
KETONES UR: NEGATIVE mg/dL
Nitrite: POSITIVE — AB
PROTEIN: 100 mg/dL — AB
Specific Gravity, Urine: 1.025 (ref 1.005–1.030)
UROBILINOGEN UA: 1 mg/dL (ref 0.0–1.0)
pH: 5.5 (ref 5.0–8.0)

## 2015-01-07 LAB — URINE MICROSCOPIC-ADD ON

## 2015-01-07 LAB — POCT PREGNANCY, URINE: Preg Test, Ur: NEGATIVE

## 2015-01-07 MED ORDER — CIPROFLOXACIN HCL 500 MG PO TABS: 500.0000 mg | ORAL_TABLET | Freq: Two times a day (BID) | ORAL | Status: DC

## 2015-01-07 NOTE — MAU Provider Note (Signed)
History     CSN: [id number]  Arrival date and time: 01/07/15 4098   First Provider Initiated Contact with Patient 01/07/15 2031      Chief Complaint  Patient presents with  . Vaginal Pain   HPI  Ms. [name] is a 34 y.o. [phone] who presents to MAU today with complaint of 2 days of dysuria, increased frequency and urgency. The patient denies fever, hematuria, vaginal discharge, pelvic pain or flank pain. She has not tried anything for her symptoms.   OB History    Gravida Para Term Preterm AB TAB SAB Ectopic Multiple Living   Past Medical History  Diagnosis Date  . Anemia   . Low back pain     Past Surgical History  Procedure Laterality Date  . Cesarean section  2003, 2004, 2009    No family history on file.  History  Substance Use Topics  . Smoking status: Current Every Day Smoker  . Smokeless tobacco: Never Used  . Alcohol Use: No    Allergies: No Known Allergies  Prescriptions prior to admission  Medication Sig Dispense Refill Last Dose  . ibuprofen (ADVIL,MOTRIN) 800 MG tablet Take 1 tablet (800 mg total) by mouth every 6 (six) hours as needed. 21 tablet 0   . levonorgestrel (MIRENA) 20 MCG/24HR IUD 1 each by Intrauterine route once.   march 2010  . oxyCODONE-acetaminophen (PERCOCET/ROXICET) 5-325 MG per tablet Take 1 tablet by mouth every 6 (six) hours as needed for severe pain. 10 tablet 0     Review of Systems  Constitutional: Negative for fever and malaise/fatigue.  Gastrointestinal: Negative for abdominal pain.  Genitourinary: Positive for dysuria, urgency and frequency. Negative for hematuria and flank pain.       Neg - vaginal bleeding, discharge   Physical Exam   Blood pressure 130/85, pulse 84, temperature 98.9 F (37.2 C), temperature source Oral, resp. rate 18, last menstrual period 12/19/2014.  Physical Exam  Nursing note and vitals reviewed. Constitutional: She is oriented to person, place, and time. She  appears well-developed and well-nourished. No distress.  HENT:  Head: Normocephalic and atraumatic.  Cardiovascular: Normal rate.   Respiratory: Effort normal.  GI: Soft. She exhibits no distension and no mass. There is no tenderness. There is no rebound, no guarding and no CVA tenderness.  Neurological: She is alert and oriented to person, place, and time.  Skin: Skin is warm and dry. No erythema.  Psychiatric: She has a normal mood and affect.   Results for orders placed or performed during the hospital encounter of 01/07/15 (from the past 24 hour(s))  Urinalysis, Routine w reflex microscopic (not at [facility])     Status: Abnormal   Collection Time: 01/07/15  6:50 PM  Result Value Ref Range   Color, Urine BROWN (A) YELLOW   APPearance CLOUDY (A) CLEAR   Specific Gravity, Urine 1.025 1.005 - 1.030   pH 5.5 5.0 - 8.0   Glucose, UA NEGATIVE NEGATIVE mg/dL   Hgb urine dipstick LARGE (A) NEGATIVE   Bilirubin Urine NEGATIVE NEGATIVE   Ketones, ur NEGATIVE NEGATIVE mg/dL   Protein, ur 295 (A) NEGATIVE mg/dL   Urobilinogen, UA 1.0 0.0 - 1.0 mg/dL   Nitrite POSITIVE (A) NEGATIVE   Leukocytes, UA MODERATE (A) NEGATIVE  Urine microscopic-add on     Status: Abnormal   Collection Time: 01/07/15  6:50 PM  Result Value Ref Range  Squamous Epithelial / LPF MANY (A) RARE   WBC, UA TOO NUMEROUS TO COUNT <3 WBC/hpf   RBC / HPF TOO NUMEROUS TO COUNT <3 RBC/hpf   Bacteria, UA MANY (A) RARE   Urine-Other FIELD OBSCURED BY RBC'S   Pregnancy, urine POC     Status: None   Collection Time: 01/07/15  6:55 PM  Result Value Ref Range   Preg Test, Ur NEGATIVE NEGATIVE    MAU Course  Procedures None  MDM UPT - negative UA today  Assessment and Plan  A: UTI  P: Discharge home Rx for Cipro given to patient Warning signs for pyelonephritis discussed Patient advised to follow-up with PCP as needed Patient may return to MAU as needed or if her condition were to change or worsen  Marny Lowenstein,  PA-C  01/07/2015, 8:31 PM

## 2015-01-07 NOTE — Discharge Instructions (Signed)

## 2015-01-07 NOTE — MAU Note (Signed)
having vaginal discomfort.  Thought it was her period going to come on, but this is different.  Some burning

## 2015-01-10 LAB — URINE CULTURE

## 2015-01-27 IMAGING — CT CT PELVIS W/ CM
1 series · 15 of 32 positions shown, 19 images · IV contrast (omnipaque)
Comparison: None.

CLINICAL DATA: 33-year-old female with lesion on the right inter-
buttock lanced on 05/08/2014 with purulent yellow drainage,
complaining of pain at the site of drainage.

EXAM:
CT PELVIS WITH CONTRAST
TECHNIQUE: Multidetector CT imaging of the pelvis was performed using the
standard protocol following the bolus administration of intravenous
contrast.
CONTRAST:  50mL OMNIPAQUE IOHEXOL 300 MG/ML  SOLN

[Series 3: pelvis with · axial · 0.80mm/px · z∈[-398,-178]mm · 15 of 50 slices shown, 19 images]
[im 4/50  soft-tissue]
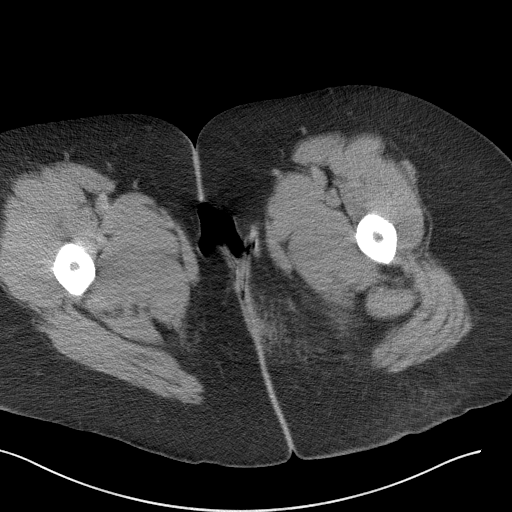
[im 4/50  bone]
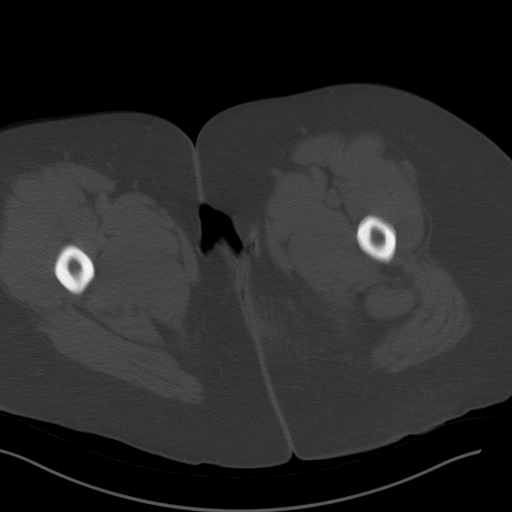
[im 7/50  soft-tissue]
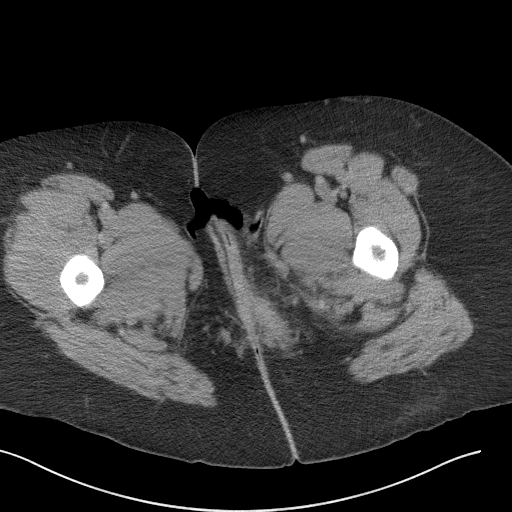
[im 10/50  soft-tissue]
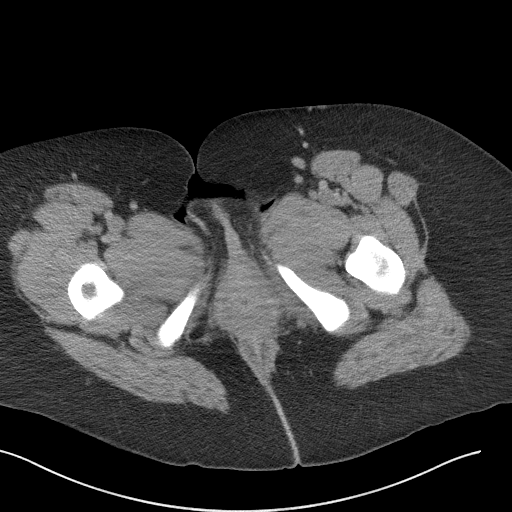
[im 15/50  soft-tissue]
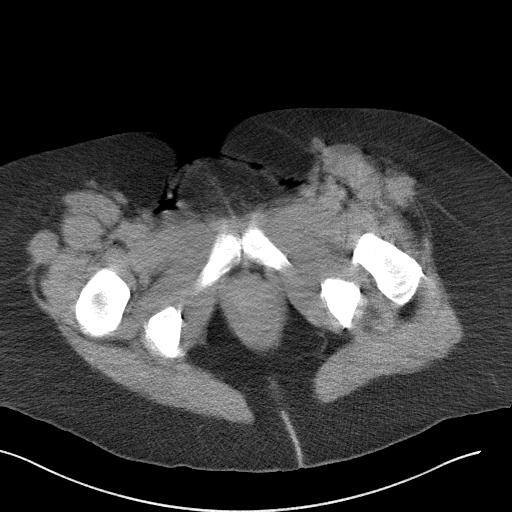
[im 18/50  soft-tissue]
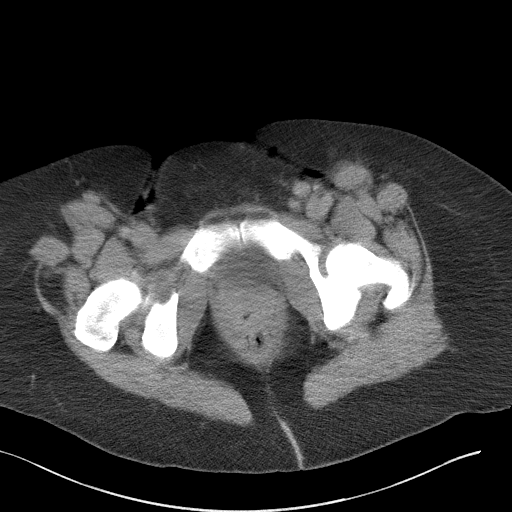
[im 21/50  soft-tissue]
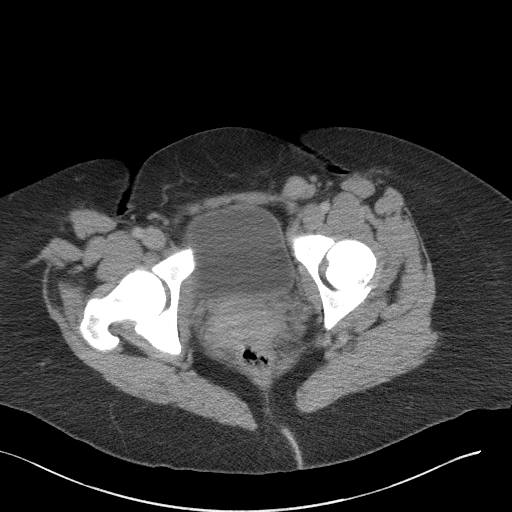
[im 26/50  soft-tissue]
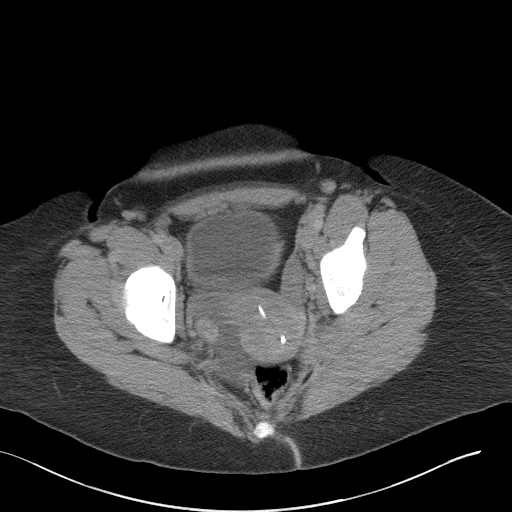
[im 29/50  soft-tissue]
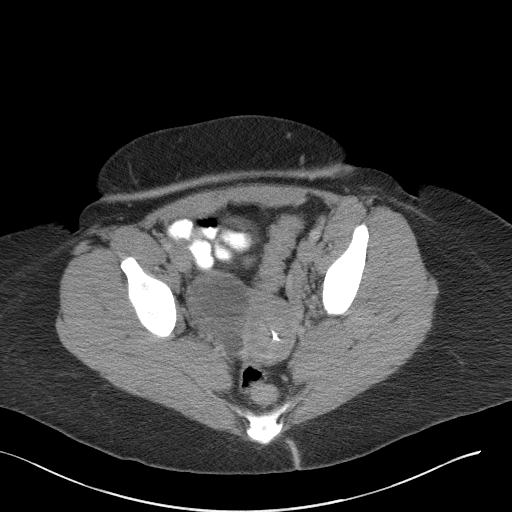
[im 32/50  soft-tissue]
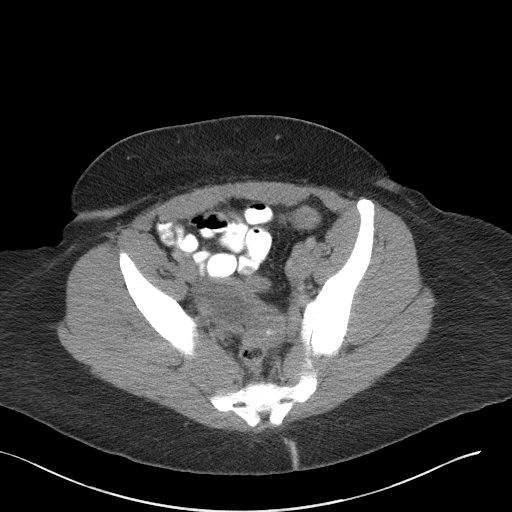
[im 32/50  bone]
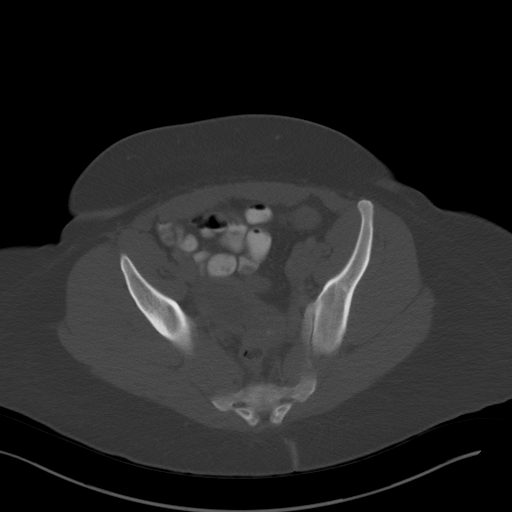
[im 35/50  soft-tissue]
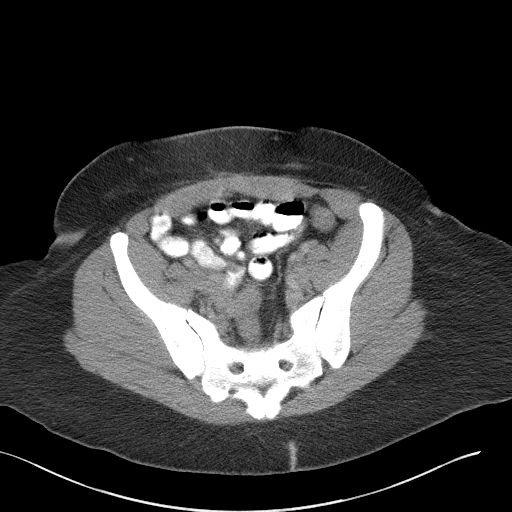
[im 40/50  soft-tissue]
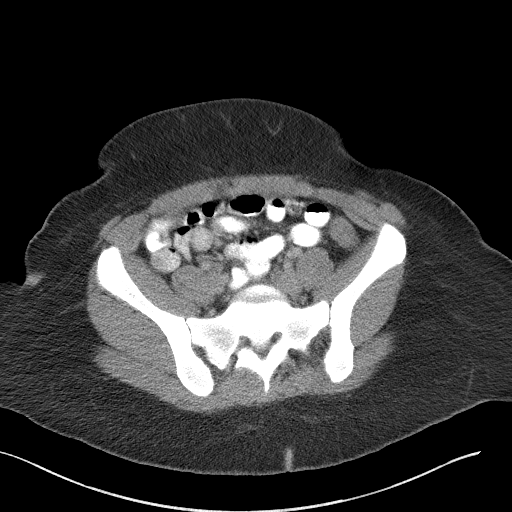
[im 43/50  soft-tissue]
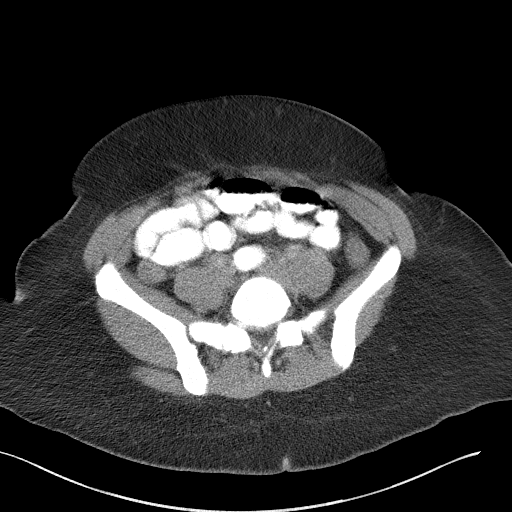
[im 43/50  lung]
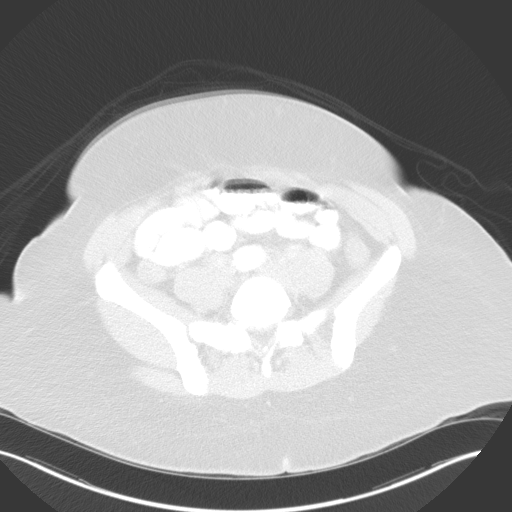
[im 45/50  lung]
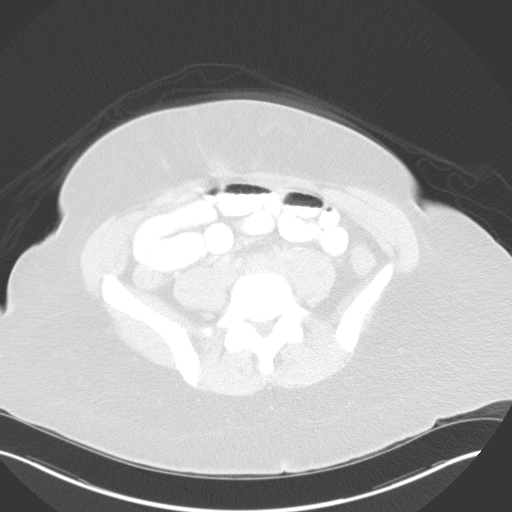
[im 46/50  soft-tissue]
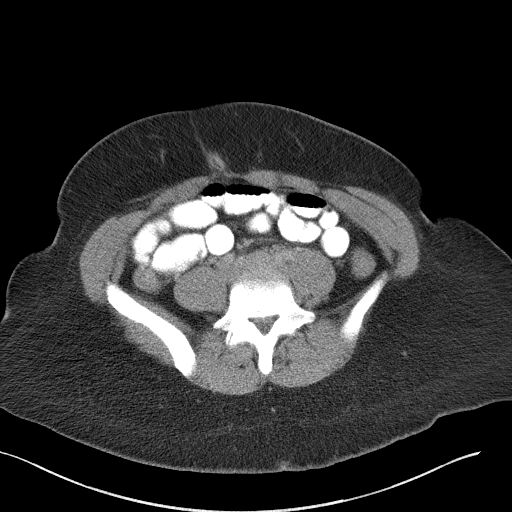
[im 46/50  lung]
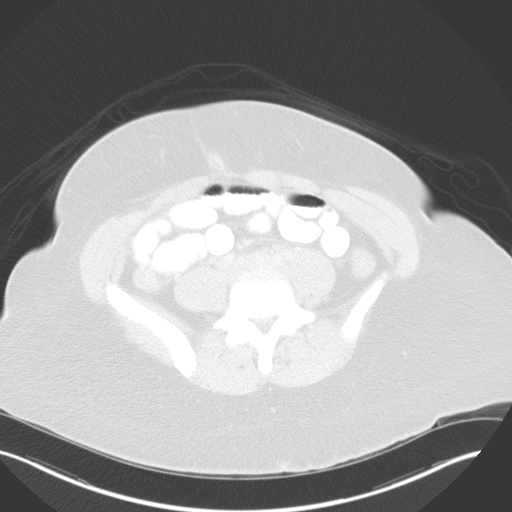
[im 48/50  lung]
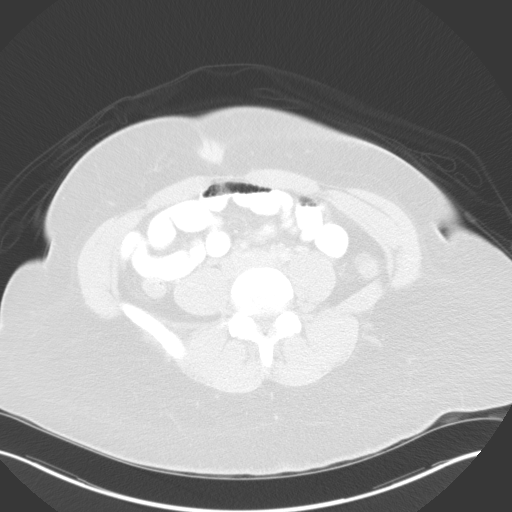

[15 of 32 positions shown; findings below may reference images not displayed]

FINDINGS: Pelvis: IUD present in the uterus. Left ovary is unremarkable in
appearance. Multiple low-attenuation lesions in the right ovary,
largest of which measures 3.6 x 4.4 x 3.6 cm, presumably an ovarian
cyst. Trace volume of free fluid in the cul-de-sac, presumably
physiologic in this young female patient. Multiple enlarged left
inguinal lymph nodes, presumably reactive, largest of which measures
17 mm in short axis.

Musculoskeletal: Extensive stranding in the subcutaneous fat in the
medial aspect of the left thigh and buttock, presumably inflammation
at site of recently drained abscess. No discrete fluid collection
identified at this time to suggest residual abscess. There are no
aggressive appearing lytic or blastic lesions noted in the
visualized portions of the skeleton.
IMPRESSION: 1. Small amount of inflammatory changes in the subcutaneous fat in
the medial aspect of the upper left thigh and buttock, presumably
related to recent incision and drainage of abscess. No residual
abscess identified at this time.
2. Additional incidental findings, as above.

## 2015-06-14 IMAGING — DX DG CHEST 2V
2 series · 2 of 2 positions shown · non-contrast
Comparison: 09/24/2013

CLINICAL DATA: Chest and back pain.

EXAM:
CHEST  2 VIEW

[chest pa]
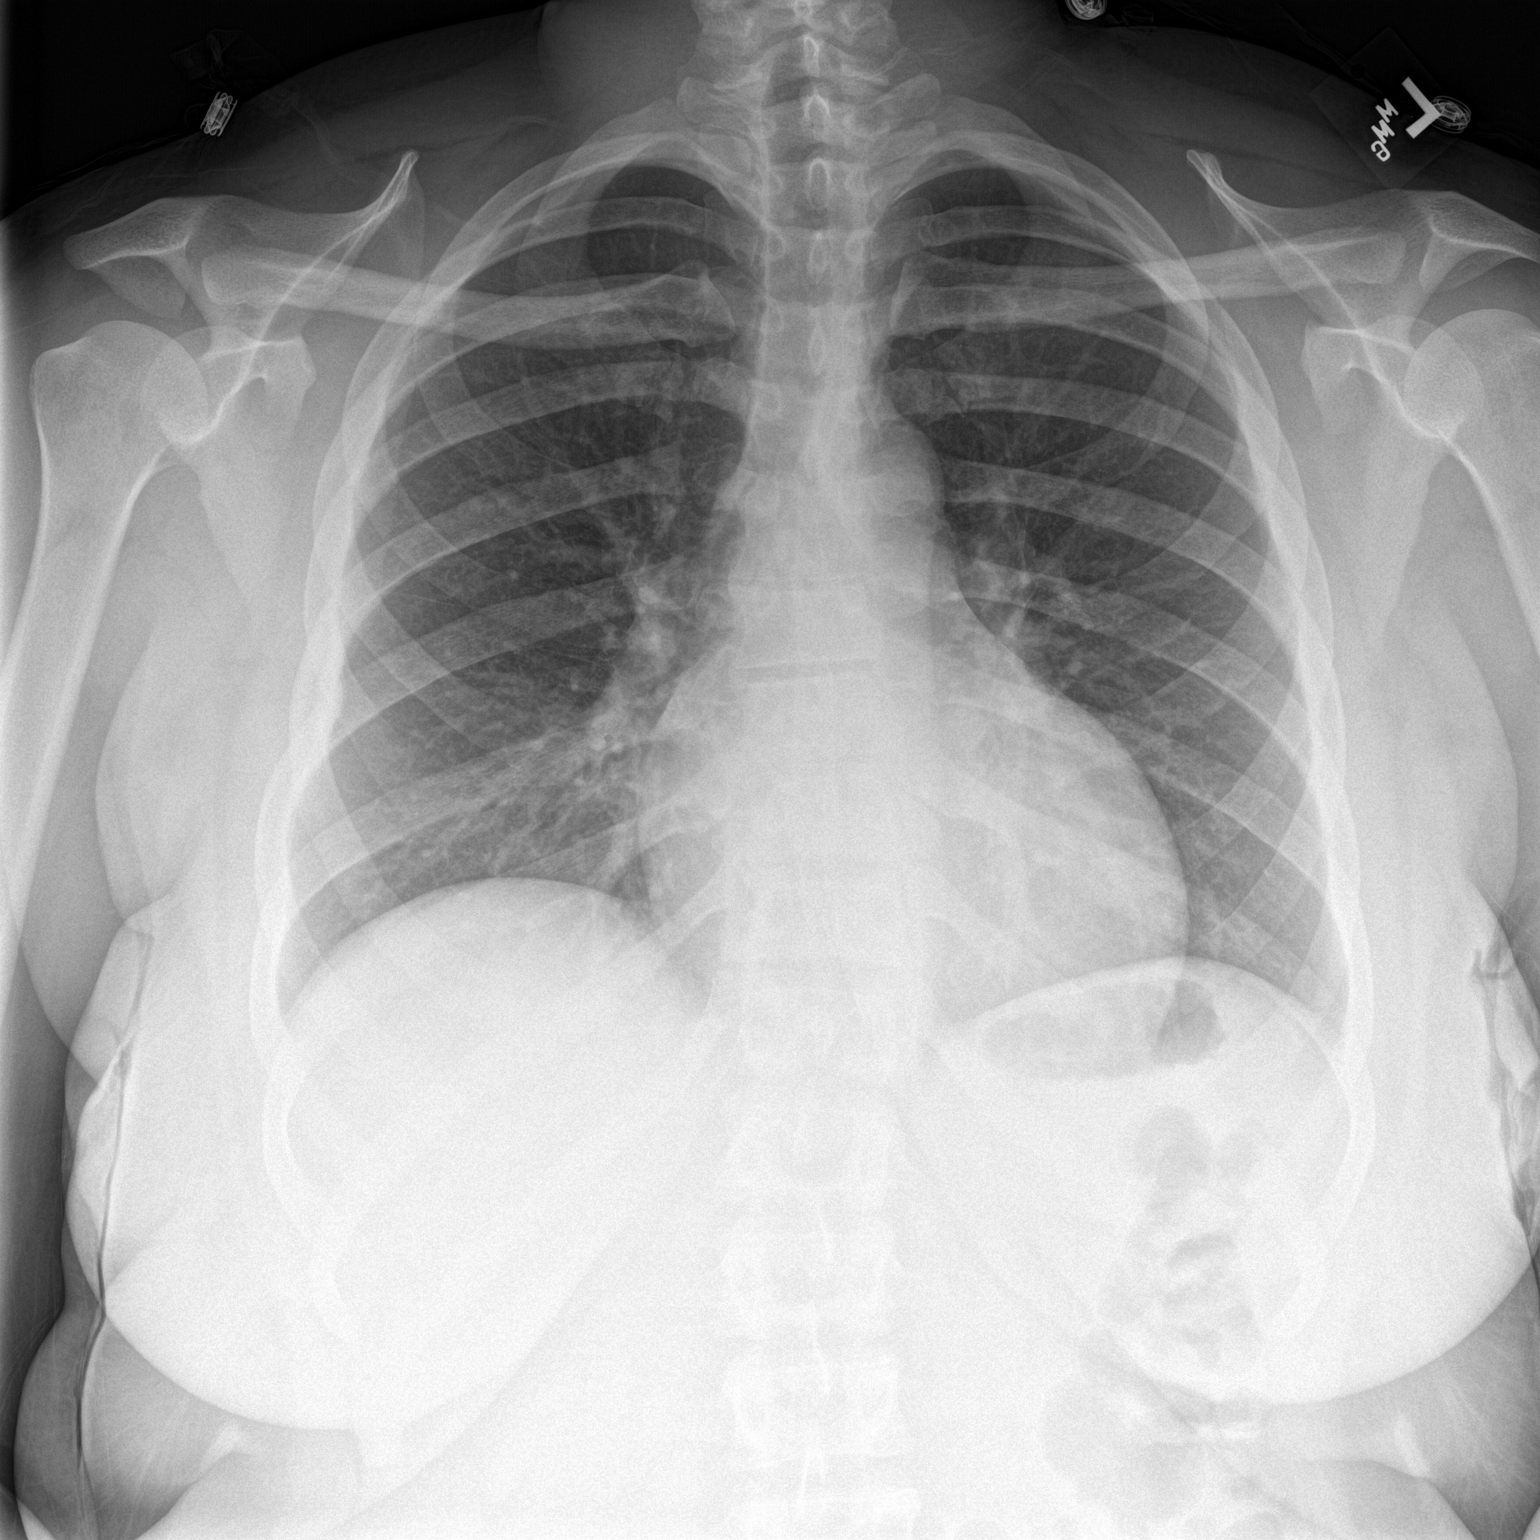

[chest lat]
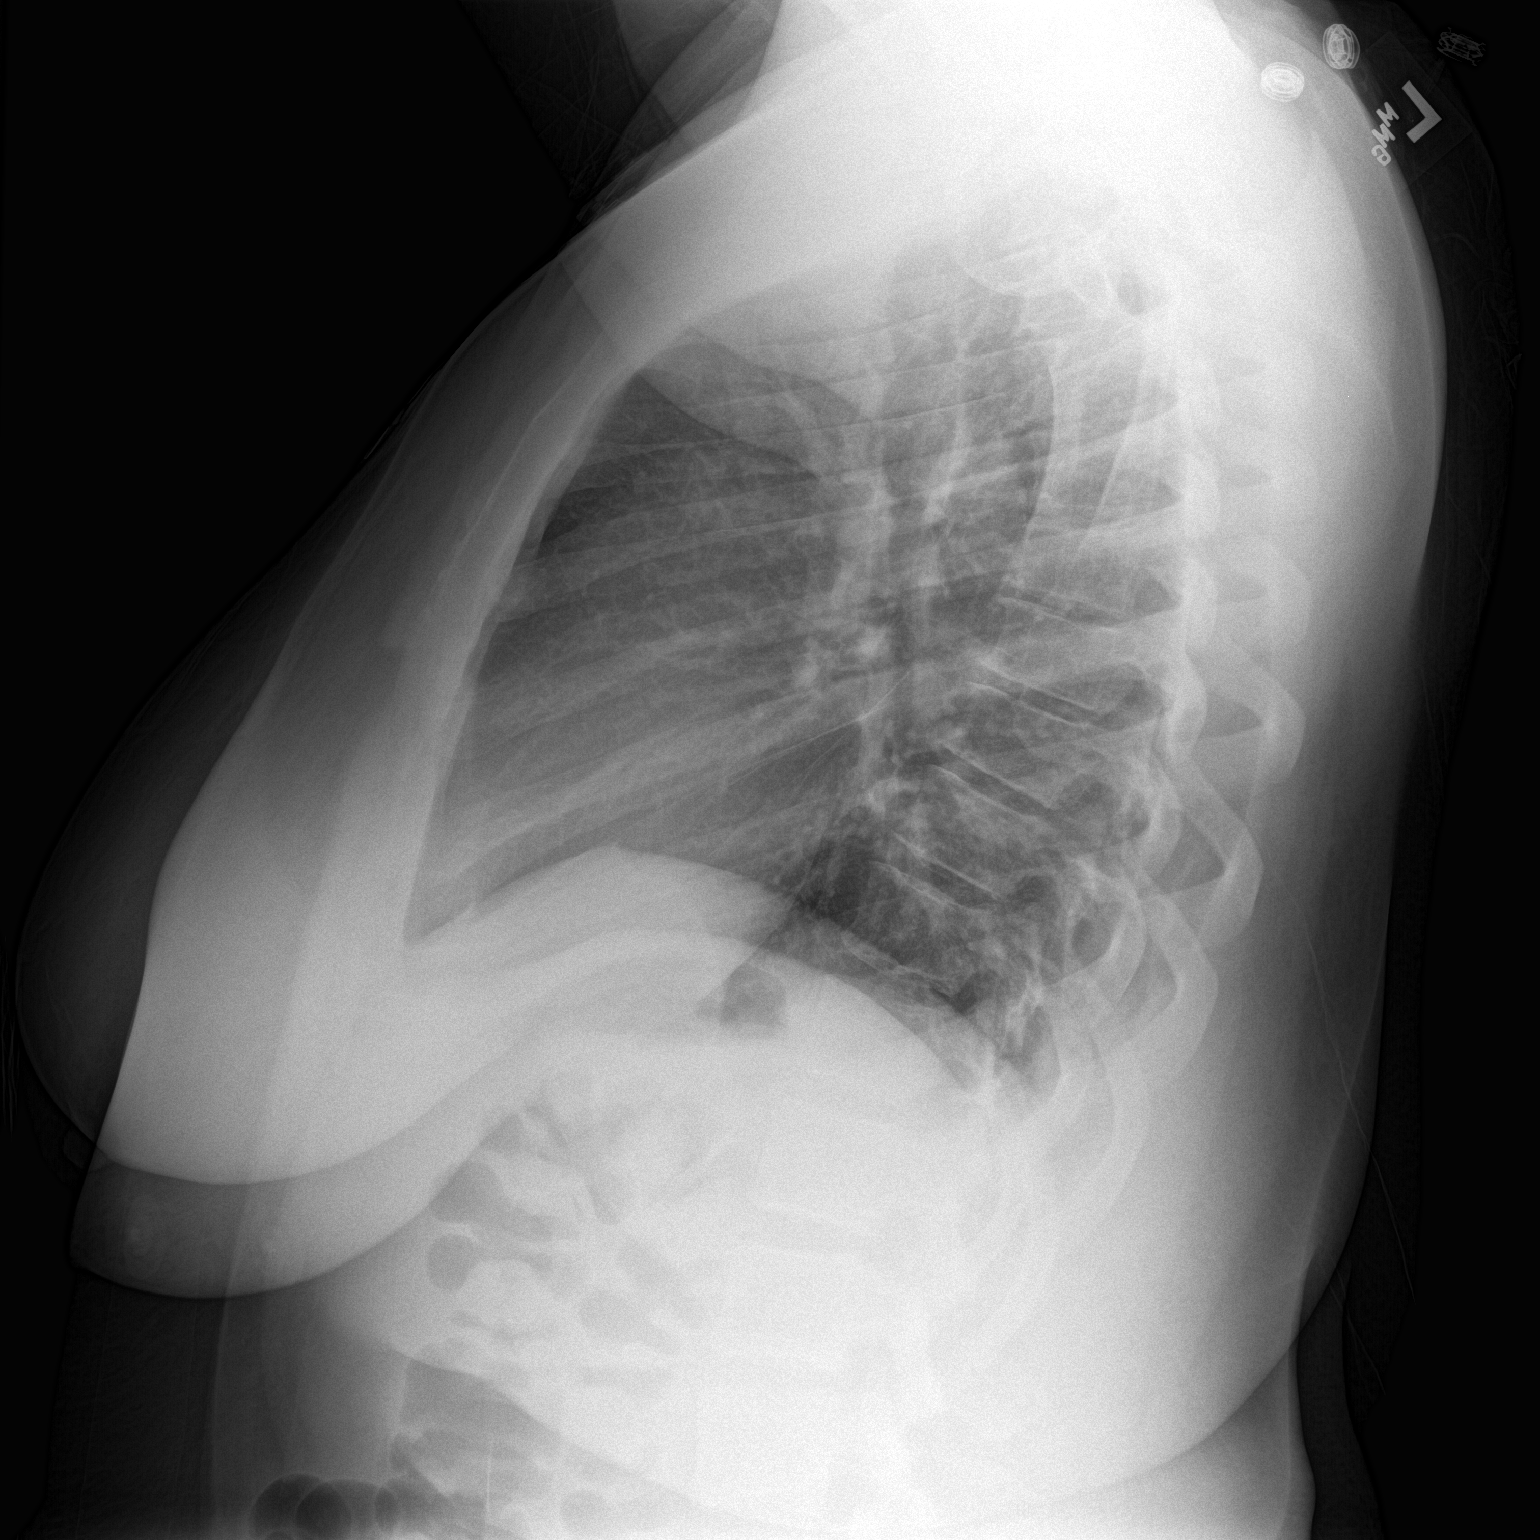

[2 of 2 positions shown; findings below may reference images not displayed]

FINDINGS: The heart size and mediastinal contours are within normal limits.
Both lungs are clear. No pleural effusion or pneumothorax. Mild
curvature of the spine.
IMPRESSION: No radiographic evidence of active cardiopulmonary disease.

## 2017-08-08 ENCOUNTER — Emergency Department (HOSPITAL_COMMUNITY)
Admission: EM | Admit: 2017-08-08 | Discharge: 2017-08-08 | Disposition: A | Discharge status: Home / Self Care | Payer: Self-pay | Attending: Emergency Medicine | Admitting: Emergency Medicine

## 2017-08-08 ENCOUNTER — Other Ambulatory Visit: Payer: Self-pay

## 2017-08-08 ENCOUNTER — Encounter (HOSPITAL_COMMUNITY): Payer: Self-pay

## 2017-08-08 DIAGNOSIS — R109 Unspecified abdominal pain: Secondary | ICD-10-CM | POA: Insufficient documentation

## 2017-08-08 DIAGNOSIS — R112 Nausea with vomiting, unspecified: Secondary | ICD-10-CM | POA: Insufficient documentation

## 2017-08-08 DIAGNOSIS — Z5321 Procedure and treatment not carried out due to patient leaving prior to being seen by health care provider: Secondary | ICD-10-CM | POA: Insufficient documentation

## 2017-08-08 MED ORDER — ONDANSETRON 4 MG PO TBDP: 4.0000 mg | ORAL_TABLET | Freq: Once | ORAL | Status: DC

## 2017-08-08 NOTE — ED Triage Notes (Signed)
PT arrives ptar from home with n/v/d, abdominal pain x 4. No emesis in route to ED. A&Ox4. NAD  122/90 Hr 90 spo2 96% rr 16

## 2017-08-08 NOTE — ED Notes (Signed)
Pt mother in room yelling at RN about staff asking her to have a seat while they helped her find her daughter. Pt mother states she's the mother and she will be where ever her daughter is and that no one would ask her to have a seat. I explained to the mother she was interrupting the triage process and I was unable to care for her daughter while she was arguing with me. Mother continued to argue with this RN and security asked to escort her back to the lobby at this time. PT states she will just leave and be seen else where. I explained to the patient we would like to help her but can not do so with her mother being disrespectful and not allowing us to complete the triage. Pt ambulatory out of department

## 2020-05-12 ENCOUNTER — Other Ambulatory Visit: Payer: Self-pay

## 2020-05-12 ENCOUNTER — Encounter (HOSPITAL_COMMUNITY): Payer: Self-pay | Admitting: Emergency Medicine

## 2020-05-12 ENCOUNTER — Emergency Department (HOSPITAL_COMMUNITY)
Admission: EM | Admit: 2020-05-12 | Discharge: 2020-05-12 | Disposition: A | Discharge status: Home / Self Care | Payer: Self-pay | Attending: Emergency Medicine | Admitting: Emergency Medicine

## 2020-05-12 DIAGNOSIS — A5901 Trichomonal vulvovaginitis: Secondary | ICD-10-CM | POA: Insufficient documentation

## 2020-05-12 DIAGNOSIS — N390 Urinary tract infection, site not specified: Secondary | ICD-10-CM | POA: Insufficient documentation

## 2020-05-12 DIAGNOSIS — R102 Pelvic and perineal pain: Secondary | ICD-10-CM | POA: Insufficient documentation

## 2020-05-12 DIAGNOSIS — F17210 Nicotine dependence, cigarettes, uncomplicated: Secondary | ICD-10-CM | POA: Insufficient documentation

## 2020-05-12 LAB — HIV ANTIBODY (ROUTINE TESTING W REFLEX): HIV Screen 4th Generation wRfx: NONREACTIVE

## 2020-05-12 LAB — URINALYSIS, ROUTINE W REFLEX MICROSCOPIC
Bilirubin Urine: NEGATIVE
Glucose, UA: NEGATIVE mg/dL
Ketones, ur: NEGATIVE mg/dL
Nitrite: POSITIVE — AB
Protein, ur: 100 mg/dL — AB
RBC / HPF: >50 RBC/hpf — ABNORMAL HIGH (ref 0–5)
Specific Gravity, Urine: 1.026 (ref 1.005–1.030)
WBC, UA: >50 WBC/hpf — ABNORMAL HIGH (ref 0–5)
pH: 6 (ref 5.0–8.0)

## 2020-05-12 LAB — I-STAT BETA HCG BLOOD, ED (MC, WL, AP ONLY): I-stat hCG, quantitative: <5 m[IU]/mL (ref <5)

## 2020-05-12 LAB — WET PREP, GENITAL
Clue Cells Wet Prep HPF POC: NONE SEEN
Sperm: NONE SEEN
Yeast Wet Prep HPF POC: NONE SEEN

## 2020-05-12 LAB — RPR: RPR Ser Ql: NONREACTIVE

## 2020-05-12 MED ORDER — IBUPROFEN 400 MG PO TABS
600.0000 mg | ORAL_TABLET | Freq: Once | ORAL | Status: AC
  Administered 2020-05-12: 600 mg via ORAL
  Filled 2020-05-12: qty 1

## 2020-05-12 MED ORDER — METRONIDAZOLE 500 MG PO TABS: 500.0000 mg | ORAL_TABLET | Freq: Two times a day (BID) | ORAL | 0 refills | Status: DC

## 2020-05-12 MED ORDER — CEPHALEXIN 500 MG PO CAPS: 500.0000 mg | ORAL_CAPSULE | Freq: Four times a day (QID) | ORAL | 0 refills | Status: DC

## 2020-05-12 NOTE — ED Triage Notes (Signed)
Pt c/o increasing vaginal pain, denies abnormal discharge or bleeding, states her IUD has been in too long.

## 2020-05-12 NOTE — Discharge Instructions (Addendum)
Your testing today was positive for trichomonas.  Please take the antibiotic as prescribed.  I have also provided with antibiotics for a urinary tract infection.  1 of these antibiotics will be taking for 5 days (Keflex) the other one for 1 week (Flagyl) please follow-up closely with the [facility].  I given you the information for this.  Please read the attached information on trichomonas.   Results of your gonorrhea and chlamydia tests are pending and you will be notified if they are positive. Please refrain from intercourse for 7 days and until all sex partners (within previous 60 days) are evaluated and/or treated as well. Please follow up with your primary care provider for continued care and further STD evaluation.  It is very important to practice safe sex and use condoms when sexually active. If your results are positive you need to notify all sexual partners so they can be treated as well. The website https://garcia.net/ can be used to send anonymous text messages or emails to alert sexual contacts. Follow up with your doctor, or OBGYN in regards to today's visit.   Gonorrhea and Chlamydia SYMPTOMS  In females, symptoms may go unnoticed. Symptoms that are more noticeable can include:  Belly (abdominal) pain.  Painful intercourse.  Watery mucous-like discharge from the vagina.  Miscarriage.  Discomfort when urinating.  Inflammation of the rectum.  Abnormal gray-green frothy vaginal discharge  Vaginal itching and irritatio  Itching and irritation of the area outside the vagina.   Painful urination.  Bleeding after sexual intercourse.  In males, symptoms include:  Burning with urination.  Pain in the testicles.  Watery mucous-like discharge from the penis.  It can cause longstanding (chronic) pelvic pain after frequent infections.  TREATMENT  PID can cause women to not be able to have children (sterile) if left untreated or if half-treated.  It is important to finish  ALL medications given to you.  This is a sexually transmitted infection. So you are also at risk for other sexually transmitted diseases, including HIV (AIDS), it is recommended that you get tested. HOME CARE INSTRUCTIONS  Warning: This infection is contagious. Do not have sex until treatment is completed. Follow up at your caregiver's office or the clinic to which you were referred. If your diagnosis (learning what is wrong) is confirmed by culture or some other method, your recent sexual contacts need treatment. Even if they are symptom free or have a negative culture or evaluation, they should be treated.  PREVENTION  Women should use sanitary pads instead of tampons for vaginal discharge.  Wipe front to back after using the toilet and avoid douching.   Practice safe sex, use condoms, have only one sex partner and be sure your sex partner is not having sex with others.  Ask your caregiver to test you for chlamydia at your regular checkups or sooner if you are having symptoms.  Ask for further information if you are pregnant.  SEEK IMMEDIATE MEDICAL CARE IF:  You develop an oral temperature above 102 F (38.9 C), not controlled by medications or lasting more than 2 days.  You develop an increase in pain.  You develop any type of abnormal discharge.  You develop vaginal bleeding and it is not time for your period.  You develop painful intercourse.   Bacterial Vaginosis  Bacterial vaginosis (BV) is a vaginal infection where the normal balance of bacteria in the vagina is disrupted. This is not a sexually transmitted disease and your sexual partners do  NOT need to be treated. CAUSES  The cause of BV is not fully understood. BV develops when there is an increase or imbalance of harmful bacteria.  Some activities or behaviors can upset the normal balance of bacteria in the vagina and put women at increased risk including:  Having a new sex partner or multiple sex partners.  Douching.  Using an  intrauterine device (IUD) for contraception.  It is not clear what role sexual activity plays in the development of BV. However, women that have never had sexual intercourse are rarely infected with BV.  Women do not get BV from toilet seats, bedding, swimming pools or from touching objects around them.   SYMPTOMS  Grey vaginal discharge.  A fish-like odor with discharge, especially after sexual intercourse.  Itching or burning of the vagina and vulva.  Burning or pain with urination.  Some women have no signs or symptoms at all.   TREATMENT  Sometimes BV will clear up without treatment.  BV may be treated with antibiotics.  BV can recur after treatment. If this happens, a second round of antibiotics will often be prescribed.  HOME CARE INSTRUCTIONS  Finish all medication as directed by your caregiver.  Do not have sex until treatment is completed.  Do NOT drink any alcoholic beverages while being treated  with Metronidazole (Flagyl). This will cause a severe reaction inducing vomiting.  RESOURCE GUIDE  Dental Problems  Patients with Medicaid: [facility] [phone] [address]                                           [phone] Phone:  [phone]                                                  Phone:  [phone]  If unable to pay or uninsured, contact:  [facility] or [facility]. to become qualified for the adult dental clinic.  Chronic Pain Problems Contact [facility]  [phone] Patients need to be referred by their primary care doctor.  Insufficient Money for Medicine Contact United Way:  call "211" or [facility] [phone].  No Primary Care Doctor Call [facility]  [phone] Other agencies that provide inexpensive medical care    [facility]  [phone]    [facility]  [phone]    [facility]  [phone]    [facility]   [phone]    [facility]  [phone]    [facility]  [phone]  Psychological Services [facility] Behavioral Health  [phone] [facility]  [phone] [facility] Mental Health   [phone] (emergency services [phone])  Substance Abuse Resources [facility] and Drug Services  9132923675 [facility] [phone] [facility] [place] [phone] Floydene Flock [phone] Residential & Outpatient Substance Abuse Program  [phone]  Abuse/Neglect [facility] Child Abuse Hotline 505-375-6540 [facility] Child Abuse Hotline 6201373686 (After Hours)  [facility] 712-164-7754  Maternity Homes Room at the [place] [facility] [phone] [facility] Services [phone]  MRSA Hotline #:   (404) 474-6918    [facility] Resources  [facility] [place]     [employer]                          [facility] Dept. [address] [place]                       [address]      [address]  Blondell Reveal Phone:  [phone]                                   Phone:  [phone]                 Phone:  [phone]  [facility] Mental [facility] Phone:  [phone]  [facility] Child Abuse Hotline 651-488-8347 [phone] (After Hours)

## 2020-05-12 NOTE — ED Notes (Signed)
Pelvic cart set up at bedside  

## 2020-05-12 NOTE — ED Provider Notes (Signed)
[facility] EMERGENCY DEPARTMENT Provider Note   CSN: [id number] Arrival date & time: 05/12/20  4098     History Chief Complaint  Patient presents with  . Vaginal Pain    [name] is a 39 y.o. female.  HPI Patient is a 39 year old female with a past medical history of anemia and low back pain.  She is presented today with 1 week of vaginal pain that she states was initially dull and achy but seems to have gotten more sharp.  She denies any concern for STDs although she states that she is sexually active with one partner but is uncertain of how any partners he has.  She states she has had an IUD in for several years.  She states that she has had some discomfort with urinating but denies any frequency or urgency.  She states she has not had any vaginal discharge although she is currently on her menstrual cycle.  She states that her symptoms all began with her menstrual cycle.  She initially attributed this to her menses however she states that the pain feels different than normal.  She is not of an OB/GYN.  She used to see an OB/GYN here in [place] however after he retired she did not reestablish.  She states that she does not currently have insurance and apart from the discomfort that she is experiencing today she is also here for her OB/GYN referral.  She denies any past surgical history apart from C-section.     Past Medical History:  Diagnosis Date  . Anemia   . Low back pain     There are no problems to display for this patient.   Past Surgical History:  Procedure Laterality Date  . CESAREAN SECTION  2003, 2004, 2009     OB History    Gravida  3   Para  3   Term  3   Preterm      AB      Living  3     SAB      TAB      Ectopic      Multiple      Live Births              No family history on file.  Social History   Tobacco Use  . Smoking status: Current Every Day Smoker  . Smokeless tobacco: Never Used   Substance Use Topics  . Alcohol use: No  . Drug use: No    Home Medications Prior to Admission medications   Medication Sig Start Date End Date Taking? Authorizing Provider  cephALEXin (KEFLEX) 500 MG capsule Take 1 capsule (500 mg total) by mouth 4 (four) times daily. 05/12/20   Gailen Shelter, PA  ibuprofen (ADVIL,MOTRIN) 800 MG tablet Take 1 tablet (800 mg total) by mouth every 6 (six) hours as needed. 09/26/14   Horton, Mayer Masker, MD  levonorgestrel (MIRENA) 20 MCG/24HR IUD 1 each by Intrauterine route once.    [provider]  metroNIDAZOLE (FLAGYL) 500 MG tablet Take 1 tablet (500 mg total) by mouth 2 (two) times daily. 05/12/20   Gailen Shelter, PA  oxyCODONE-acetaminophen (PERCOCET/ROXICET) 5-325 MG per tablet Take 1 tablet by mouth every 6 (six) hours as needed for severe pain. 09/26/14   Horton, Mayer Masker, MD    Allergies    Patient has no known allergies.  Review of Systems   Review of Systems  Genitourinary: Positive for vaginal bleeding (On cycle)  and vaginal pain.  All other systems reviewed and are negative.   Physical Exam Updated Vital Signs BP (!) 148/85 (BP Location: Right Arm)   Pulse 65   Temp 98 F (36.7 C) (Oral)   Resp 16   Ht 5\' 7"  (1.702 m)   Wt 111.1 kg   SpO2 100%   BMI 38.37 kg/m   Physical Exam Vitals reviewed.  Constitutional:      General: She is not in acute distress.    Appearance: Normal appearance. She is not ill-appearing.     Comments: Pleasant well-appearing 39 year old.  In no acute distress.  Sitting comfortably in bed.  Able answer questions appropriately follow commands. No increased work of breathing. Speaking in full sentences.  HENT:     Head: Normocephalic and atraumatic.  Eyes:     General: No scleral icterus.       Right eye: No discharge.        Left eye: No discharge.     Conjunctiva/sclera: Conjunctivae normal.  Cardiovascular:     Rate and Rhythm: Normal rate.     Pulses: Normal pulses.     Comments:  3+ radial pulses BL Pulmonary:     Effort: Pulmonary effort is normal.     Breath sounds: No stridor.  Abdominal:     General: Abdomen is flat.     Palpations: Abdomen is soft.     Tenderness: There is no abdominal tenderness. There is no right CVA tenderness, left CVA tenderness, guarding or rebound.  Genitourinary:    Comments: Vulva without lesions or abnormality Vaginal canal without abnormal discharge or lesion Cervix appears normal, is closed, IUD strings visible No adnexal tenderness or CMT Neurological:     Mental Status: She is alert and oriented to person, place, and time. Mental status is at baseline.  Psychiatric:        Mood and Affect: Mood normal.        Behavior: Behavior normal.     ED Results / Procedures / Treatments   Labs (all labs ordered are listed, but only abnormal results are displayed) Labs Reviewed  WET PREP, GENITAL - Abnormal; Notable for the following components:      Result Value   Trich, Wet Prep PRESENT (*)    WBC, Wet Prep HPF POC FEW (*)    All other components within normal limits  URINALYSIS, ROUTINE W REFLEX MICROSCOPIC - Abnormal; Notable for the following components:   Color, Urine AMBER (*)    APPearance CLOUDY (*)    Hgb urine dipstick LARGE (*)    Protein, ur 100 (*)    Nitrite POSITIVE (*)    Leukocytes,Ua LARGE (*)    RBC / HPF >50 (*)    WBC, UA >50 (*)    Bacteria, UA MANY (*)    All other components within normal limits  RPR  HIV ANTIBODY (ROUTINE TESTING W REFLEX)  I-STAT BETA HCG BLOOD, ED ([facility], [facility], [facility] ONLY)  GC/CHLAMYDIA PROBE AMP ([place]) NOT AT [facility]    EKG None  Radiology No results found.  Procedures Procedures (including critical care time)  Medications Ordered in ED Medications  ibuprofen (ADVIL) tablet 600 mg (600 mg Oral Given 05/12/20 0732)    ED Course  I have reviewed the triage vital signs and the nursing notes.  Pertinent labs & imaging results that were available during my care of the  patient were reviewed by me and considered in my medical decision making (see chart for  details).    MDM Rules/Calculators/A&P                          Patient is 39 year old female past medical history detailed in HPI presented today for vaginal irritation without any vaginal discharge currently on her period with vaginal bleeding.  States she has had 1 week of vaginal irritation.  She denies any sexual activity within the last week and therefore is uncertain about dyspareunia. She states that she has had some dysuria as well.  Pelvic exam is notable for no cervical motion tenderness or adnexal tenderness.  No notable vaginal discharge apart from some mild vaginal bleeding.  Wet prep came back positive for trichomonas.  I had a lengthy patient education discussion with her and she is understanding of this being an STD the need for treatment and the need for any partners to be tested.  We will treat patient with 1 week of Flagyl.  She understands need for alcohol use with this antibiotic.  Patient also does have urinalysis positive for nitrates, leukocytes, WBCs and bacteria.  As she is having urinary symptoms and has an overwhelmingly positive urinalysis we will treat with Keflex for 5 days.  She will follow up with OB/GYN as she is not currently established with 1.  I had a shared decision-making physician with patient about whether to empirically treat gonorrhea and chlamydia.  She states she prefer not to at this time.  I made sure that her information was up-to-date in her chart prior to discharge.  We will inform her of any positive results.  She has understanding of need for close follow-up and agreeable to plan.  She has no abdominal pain or any other significant symptoms today.  She is overall very well-appearing vital signs within normal limits.  She has no CVA tenderness.pyelonephritis she also has no nausea vomiting or fevers.  We will discharge with Keflex for UTI and Flagyl for  trichomonas.  She is agreeable to plan understanding of plan.  HIV RPR obtained and pending as well as GC chlamydia.  Final Clinical Impression(s) / ED Diagnoses Final diagnoses:  Vaginal pain  Trichomonal vaginitis  Lower urinary tract infectious disease    Rx / DC Orders ED Discharge Orders         Ordered    cephALEXin (KEFLEX) 500 MG capsule  4 times daily        05/12/20 0828    metroNIDAZOLE (FLAGYL) 500 MG tablet  2 times daily        05/12/20 0828           Gailen Shelter, PA 05/12/20 1143    Linwood Dibbles, MD 05/13/20 [phone]

## 2020-05-13 LAB — GC/CHLAMYDIA PROBE AMP (~~LOC~~) NOT AT ARMC
Chlamydia: NEGATIVE
Comment: NEGATIVE
Comment: NORMAL
Neisseria Gonorrhea: NEGATIVE

## 2020-08-12 ENCOUNTER — Encounter: Payer: Self-pay | Admitting: Lactation Services

## 2020-08-12 ENCOUNTER — Ambulatory Visit (INDEPENDENT_AMBULATORY_CARE_PROVIDER_SITE_OTHER): Payer: Self-pay | Admitting: Lactation Services

## 2020-08-12 VITALS — BP 145/80 | HR 76 | Ht 67.0 in | Wt 260.0 lb

## 2020-08-12 DIAGNOSIS — N3001 Acute cystitis with hematuria: Secondary | ICD-10-CM

## 2020-08-12 LAB — POCT URINALYSIS DIP (DEVICE)
Bilirubin Urine: NEGATIVE
Glucose, UA: NEGATIVE mg/dL
Ketones, ur: NEGATIVE mg/dL
Nitrite: POSITIVE — AB
Protein, ur: 100 mg/dL — AB
Specific Gravity, Urine: >=1.03 (ref 1.005–1.030)
Urobilinogen, UA: 0.2 mg/dL (ref 0.0–1.0)
pH: 6 (ref 5.0–8.0)

## 2020-08-12 MED ORDER — PHENAZOPYRIDINE HCL 200 MG PO TABS: 200.0000 mg | ORAL_TABLET | Freq: Three times a day (TID) | ORAL | 0 refills | Status: DC | PRN

## 2020-08-12 MED ORDER — NITROFURANTOIN MONOHYD MACRO 100 MG PO CAPS: 100.0000 mg | ORAL_CAPSULE | Freq: Two times a day (BID) | ORAL | 0 refills | Status: DC

## 2020-08-12 NOTE — Progress Notes (Signed)
Chart reviewed for nurse visit. Agree with plan of care.   [name], MD 08/12/20 12:37 PM

## 2020-08-12 NOTE — Progress Notes (Signed)
Patient reports burning, frequency, and urgency with urination for the last 3 days. She reports she has taken AZO x 3 days with some relief of frequency and urgency. She is noting blood after urination now.   Patient reports she had a UTI in December and that her GM passed from bladder cancer. Clean catch instructions given and Urine obtained.  BP 145/80, patient reports she typically does not have high BP but was feeling rushed this morning. List of PCP's for area given and advised patient to follow up with one at her earliest convenience. Patient voiced understanding.    Macrobid and Pyridium ordered per standing order. Urine culture ordered and patient informed she will be called with any abnormal results.

## 2020-08-12 NOTE — Patient Instructions (Signed)
AREA FAMILY PRACTICE PHYSICIANS  [place] ([address]) . [facility] o [address] [place], [place] [address] o [phone] o Mon-Fri 8:30-12:30, 1:30-5:00 o Accepting Medicaid . [facility] o [address], [place], [place] [address] o [phone] o Mon-Fri 8:00-5:30 . [facility] o [address] [place], [place] [address] o [phone] o Mon, Tue, Thur, Fri 8:30-5:00, Wed 10:00-7:00 (closed 1-2pm) o Accepting Medicaid . [facility] o [address], [place], [place]  [address] o Phone - [phone]   Fax - [phone]  [place] ([address]) . [facility] o [address] [place], [place] [address] o [phone] o Mon-Fri 8:00-5:00 . [facility] ([facility]  o [address] [place], [place] [address] o [phone] o Mon-Fri 8:30-5:30, Sat (Oct.-Mar.) 9:00-1:00 o Accepting Medicaid  [place] ([address]) . [facility] o [address], [place], [place] [address] o [phone] o Mon-Fri 8:00-5:00  [place] ([address]) . [facility] [facility] o [address], [place], [place] [address] o [phone] o Mon-Fri 8:00-5:00 . [facility] o [address], [place], [place] [address] o [phone] o Mon-Fri 8:00-5:00 . [facility] o [address] [place], [place] [address] o [phone] o Mon-Fri 8:00-5:00 . [facility] o [address] [place] [address] o [phone] o Mon-Fri 7:30-5:30  [place] ([address] & [address]) . [facility] o [address] [place], [place] [address] o [phone] o Mon-Thur 8:00-6:00 o Accepting Medicaid . [facility] o [address] [place], [place] [address] o [phone] o Mon-Thur 7:30-7:30, Fri 7:30-4:30 o Accepting Medicaid . [facility] [place] o [address], [place], [place]  [address] o [phone]   Fax - [phone]  [place] ([address] & [address]) . [facility] o [address] , [place] [address] o [phone] o Mon-Fri 7:00-5:00 . [facility] o [address], [place], [place] [address] o [phone] o Mon-Fri 8:00-5:00 o Accepting Medicaid . [facility] o [address], , [place] [address] o [phone] o Mon-Fri 8:00-5:00 o Accepting Medicaid  [place] ([address]) . [facility] [place] o [address] [place], [place] [address] o [phone] o Mon-Fri 8:00-5:00 . [facility] o [address], [place], [place] [address] o [phone] o Mon-Fri 8:00-5:00 o Accepting Medicaid . [facility] o [address], [place], [place] [address] o [phone] o Mon-Fri 8:00-5:30, Sat&Sun by appointment (phones open at 8:30) o Accepting Medicaid  [place] ([address]) . [facility] o [address] [place], [place] [address] o [phone] o Mon-Thur 8:00-7:00, Fri 8:00-5:00, Sat 8:00-12:00, Sun 9:00-12:00 o Accepting Medicaid . [facility] [place] o [address], [place],  [address] o [phone] o Mon-Thur 8:00-5:00 o Accepting Medicaid . [facility] o [address] [place],  [address] o [phone] o Mon-Fri 8:00-5:30, Sat (Oct.-Mar.) 9:00-1:00 o Accepting Medicaid  [place]  ([address]) .   [facility] o [address], [place], [place] [address] o [phone] o Mon-Fri 8:00-5:00 o Accepting Medicaid   [place] ([address]) . [facility] [place] o [address], [place], [place] [address] o [phone] o Mon-Fri 8:00-5:00 . [facility] [place] o [address], [place], [place] [address] o [phone] o Mon-Fri 8:00-5:00 . [facility] [place] o [address] [place] [address], [place], [place] [address] o [phone] o Mon-Fri 8:00-5:00 o After hours clinic ([address] [place], [place] [address]) [phone] Mon-Fri 5:00-8:00, Sat 12:00-6:00, Sun 10:00-4:00 o Accepting Medicaid . [facility] [place] o [address], [place], [place]  [address] o [phone]   Fax - [phone]  [place] ([address]) . [facility] [place] o [address], [place], [place] [address] o [phone] o Mon-Fri 8:00-5:00 . [facility] [place] ([facility] [place]) o [address], [place], [place] [address] o [phone] o Mon-Thur 8:00-7:00, Fri 8:00-5:00, Sat 8:00-12:00    

## 2020-08-14 LAB — URINE CULTURE

## 2020-09-23 ENCOUNTER — Other Ambulatory Visit (HOSPITAL_COMMUNITY)
Admission: RE | Admit: 2020-09-23 | Discharge: 2020-09-23 | Disposition: A | Discharge status: Home / Self Care | Payer: Self-pay | Source: Ambulatory Visit | Attending: Family Medicine | Admitting: Family Medicine

## 2020-09-23 ENCOUNTER — Other Ambulatory Visit: Payer: Self-pay

## 2020-09-23 ENCOUNTER — Encounter: Payer: Self-pay | Admitting: Family Medicine

## 2020-09-23 ENCOUNTER — Ambulatory Visit (INDEPENDENT_AMBULATORY_CARE_PROVIDER_SITE_OTHER): Payer: Self-pay | Admitting: Family Medicine

## 2020-09-23 VITALS — BP 135/81 | HR 89 | Ht 67.0 in | Wt 267.0 lb

## 2020-09-23 DIAGNOSIS — R399 Unspecified symptoms and signs involving the genitourinary system: Secondary | ICD-10-CM

## 2020-09-23 DIAGNOSIS — Z30432 Encounter for removal of intrauterine contraceptive device: Secondary | ICD-10-CM

## 2020-09-23 DIAGNOSIS — Z01419 Encounter for gynecological examination (general) (routine) without abnormal findings: Secondary | ICD-10-CM

## 2020-09-23 DIAGNOSIS — R030 Elevated blood-pressure reading, without diagnosis of hypertension: Secondary | ICD-10-CM

## 2020-09-23 LAB — POCT URINALYSIS DIP (DEVICE)
Bilirubin Urine: NEGATIVE
Glucose, UA: NEGATIVE mg/dL
Ketones, ur: NEGATIVE mg/dL
Nitrite: POSITIVE — AB
Protein, ur: 100 mg/dL — AB
Specific Gravity, Urine: >=1.03 (ref 1.005–1.030)
Urobilinogen, UA: 0.2 mg/dL (ref 0.0–1.0)
pH: 6 (ref 5.0–8.0)

## 2020-09-23 LAB — POCT PREGNANCY, URINE: Preg Test, Ur: NEGATIVE

## 2020-09-23 NOTE — Progress Notes (Addendum)
GYNECOLOGY ANNUAL PREVENTATIVE CARE ENCOUNTER NOTE  History:     [name] is a 40 y.o. G25P3003 female here for a routine annual gynecologic exam.  Current complaints: frequent UTIs.   Denies abnormal vaginal bleeding, discharge, pelvic pain, problems with intercourse or other gynecologic concerns.    Urinary symptoms -dysuria at the end of urination, not daily -vaginal bleeding, likely due to menstruation, not hematuria -denies frequency or urgency -E coli UTI 08/12/20, treated -otherwise no recent UTIs -has had some abnormal vaginal discharge -had trich 05/2020, was treated, partner treated -[place] IUD in for 12 years    Gynecologic History No LMP recorded. (Menstrual status: IUD). Contraception: IUD, placed 12 years ago Last Pap: unknown. Results were: normal with negative HPV per patient, not on file. Last mammogram: not done given age  Maternal grandmother and 2 great aunts with breast cancer. No history of BRCA. No family history of uterine or ovarian cancer.  Obstetric History OB History  Gravida Para Term Preterm AB Living  3 3 3     3   SAB IAB Ectopic Multiple Live Births               # Outcome Date GA Lbr Len/2nd Weight Sex Delivery Anes PTL Lv  3 Term           2 Term           1 Term             Past Medical History:  Diagnosis Date  . Anemia   . Low back pain     Past Surgical History:  Procedure Laterality Date  . CESAREAN SECTION  2003, 2004, 2009    Current Outpatient Medications on File Prior to Visit  Medication Sig Dispense Refill  . ibuprofen (ADVIL,MOTRIN) 800 MG tablet Take 1 tablet (800 mg total) by mouth every 6 (six) hours as needed. 21 tablet 0  . levonorgestrel (MIRENA) 20 MCG/24HR IUD 1 each by Intrauterine route once.    . nitrofurantoin, macrocrystal-monohydrate, (MACROBID) 100 MG capsule Take 1 capsule (100 mg total) by mouth 2 (two) times daily. 10 capsule 0  . oxyCODONE-acetaminophen (PERCOCET/ROXICET) 5-325 MG per  tablet Take 1 tablet by mouth every 6 (six) hours as needed for severe pain. (Patient not taking: No sig reported) 10 tablet 0  . phenazopyridine (PYRIDIUM) 200 MG tablet Take 1 tablet (200 mg total) by mouth 3 (three) times daily as needed for pain. (Patient not taking: Reported on 09/23/2020) 10 tablet 0   No current facility-administered medications on file prior to visit.    No Known Allergies  Social History:  reports that she has been smoking cigarettes. She has never used smokeless tobacco. She reports that she does not drink alcohol and does not use drugs.  History reviewed. No pertinent family history.  The following portions of the patient's history were reviewed and updated as appropriate: allergies, current medications, past family history, past medical history, past social history, past surgical history and problem list.  Review of Systems Pertinent items noted in HPI and remainder of comprehensive ROS otherwise negative.  Physical Exam:  BP 135/81   Pulse 89   Ht 5' 7"  (1.702 m)   Wt 267 lb (121.1 kg)   BMI 41.82 kg/m  CONSTITUTIONAL: Well-developed, well-nourished female in no acute distress.  HENT:  Normocephalic, atraumatic, External right and left ear normal. Oropharynx is clear and moist EYES: Conjunctivae and EOM are normal. Pupils are equal, round, and reactive  to light. No scleral icterus.  NECK: Normal range of motion, supple, no masses.  Normal thyroid.  SKIN: Skin is warm and dry. No rash noted. Not diaphoretic. No erythema. No pallor. MUSCULOSKELETAL: Normal range of motion. No tenderness.  No cyanosis, clubbing, or edema.  2+ distal pulses. NEUROLOGIC: Alert and oriented to person, place, and time. Normal reflexes, muscle tone coordination.  PSYCHIATRIC: Normal mood and affect. Normal behavior. Normal judgment and thought content. CARDIOVASCULAR: Normal heart rate noted, regular rhythm RESPIRATORY: Clear to auscultation bilaterally. Effort and breath sounds  normal, no problems with respiration noted. BREASTS: not done, not recommended ABDOMEN: Soft, no distention noted.  No tenderness, rebound or guarding.  PELVIC: Normal appearing external genitalia and urethral meatus; normal appearing vaginal mucosa and cervix.  No abnormal discharge noted.  Pap smear obtained.  Normal uterine size, no other palpable masses, no uterine or adnexal tenderness.  Performed in the presence of a chaperone.        GYNECOLOGY OFFICE PROCEDURE NOTE  [name] is a 39 y.o. [phone] here for [place] IUD removal. No GYN concerns.   IUD Removal  Patient identified, informed consent performed, consent signed.  Patient was in the dorsal lithotomy position, normal external genitalia was noted.  A speculum was placed in the patient's vagina, normal discharge was noted, no lesions. The cervix was visualized, no lesions, no abnormal discharge.  The strings of the IUD were grasped and pulled using ring forceps. The IUD was removed in its entirety. Patient tolerated the procedure well.    Patient will use condoms for contraception and plans to have new IUD placed at health department.  Routine preventative health maintenance measures emphasized.   Assessment and Plan:   UTI symptoms 08/12/20 E coli UTI, completed treatment. Having some residual symptoms today. Will perform gcc/wet prep and remove IUD as well as repeat urine culture given unclear what is causing patient's symptoms. -     Urine Culture  Encounter for IUD removal IUD x12 years, given symptoms will remove today. Instructed to follow up at HD for replacement and use protection in the meantime.  Women's annual routine gynecological examination -Due for pap smear, given uninsured instructed to contact BCCCP or go to health department -Maternal grandmother and 2 great aunts with breast cancer, will start mammograms at age 89. After medicaid application goes through encouraged to follow up with genetic counselor  for discussion of BRCA testing  Elevated BP without diagnosis -No prior diagnosis of HTN, asymptomatic. BP elevated today. Provided with list of primary care providers and encouraged to establish care. Also needs a1c and lipid profile given BMI.  Other orders -     POCT urinalysis dip (device)   Routine preventative health maintenance measures emphasized. Please refer to After Visit Summary for other counseling recommendations.      RTC in 1 year, sooner if needed.  Arrie Senate, MD OB Fellow, [place] for [place] 09/23/2020 10:25 AM

## 2020-09-23 NOTE — Patient Instructions (Addendum)
BCCCP (Breast and Cervical Cancer Control Program)  [phone]  IUD placement at health department  AREA FAMILY PRACTICE PHYSICIANS  [place] ([id number]) . [facility] o [address] [place] [place], [place] [address] o [phone] o Mon-Fri 8:30-12:30, 1:30-5:00 o Accepting Medicaid . [facility] Medicine at [facility] [address], [place], [place] [address] o [phone] o Mon-Fri 8:00-5:30 . [facility] [employer] o [address], [place], [place] [address] o [phone], Tue, Thur, Fri 8:30-5:00, Wed 10:00-7:00 (closed 1-2pm) o Accepting Medicaid . [facility] o [address], [place], [place]  [address] o Phone - [phone]   Fax - [phone]  [place] [phone]) . [facility] Medicine o [address] [place], [place] [address] o [phone] o Mon-Fri 8:00-5:00 . [facility] - Pediatrics at [facility])  o [address] [place] [place], [place] [address] o [phone] o Mon-Fri 8:30-5:30, Sat (Oct.-Mar.) 9:00-1:00 o Accepting [facility] [phone]) . [facility] at [facility] o [address], [place], [place] [address] o [phone] o Mon-Fri 8:00-5:00  [place] [phone]) . [facility] Medicine at [facility] o [address], [place], [place] [address] o [phone] o Mon-Fri 8:00-5:00 . Nature conservation officer at [facility] o [address] [place], [place], [place] [address] o [phone] o Mon-Fri 8:00-5:00 . Nature conservation officer at [employer] o [address] [place], [place] [address] o [phone] o Mon-Fri 8:00-5:00 . [facility] o [address] [place] [address] o [phone] o Mon-Fri 7:30-5:30  [place] [phone] & [phone]) . [facility] o [address], [place], [place]  [address] o [phone] o Mon-Thur 8:00-6:00 o Accepting Medicaid . [facility] Medicine o [address] [place], [place] [address] o [phone] o Mon-Thur 7:30-7:30, Fri 7:30-4:30 o Accepting Medicaid . [facility] at [facility] o [phone] N[address] [place], [place]  [address] o [phone]   Fax - [phone]  [place] [phone] & [phone]) . Nature conservation officer at [employer] o [address] [place], [place] [address] o [phone] o Mon-Fri 7:00-5:00 . [facility] Medicine o [address], [place], [place] [address] o [phone] o Mon-Fri 8:00-5:00 o Accepting Medicaid . [facility] - [facility] o [address] [place], [place] [address] o [phone] o Mon-Fri 8:00-5:00 o Accepting Medicaid  [facility] [place] [phone]) . [facility] Primary Care at [facility] o [address] [place], [place] [address] o [phone] o Mon-Fri 8:00-5:00 . [facility] - [facility] Family Medicine at [employer]) o [address], [place], [place] [address] o [phone] o Mon-Fri 8:00-5:00 o Accepting Medicaid . [facility] Pediatric[facility] - [facility] [employer]) o [address], [place], [place] [address] o [phone] o Mon-Fri 8:00-5:30, Sat&Sun by appointment (phones open at 8:30) o Accepting [facility] [phone] & [phone]) . [facility] Medicine o [address], [place], [place] [address] o [phone] o Mon-Thur 8:00-7:00, Fri 8:00-5:00, Sat 8:00-12:00, Sun 9:00-12:00 o Accepting Medicaid . [facility] - Family Medicine at [facility] [address], [place], [place] [address] o [phone] o Mon-Thur 8:00-5:00 o Accepting Medicaid . [facility] at [place] o [address] [place]  [place], [place] [address]  o [phone] o Mon-Fri 8:00-5:30, Sat (Oct.-Mar.) 9:00-1:00 o Accepting Medicaid  [place] ([phone]) . [facility] o [address] [place], [place] [address] o [phone] o Mon-Fri 8:00-5:00 o Accepting Medicaid   [facility] [phone]) . [facility] Medicine at [facility] o [address], [place], [place] [address] o [phone] o Mon-Fri 8:00-5:00 . Nature conservation officer at [facility] o [address], [place], [place] [address] o [phone] o Mon-Fri 8:00-5:00 . [facility] - [facility] - [place] o [address], [place], [place] [address] o [phone] o Mon-Fri 8:00-5:00 o After hours clinic [facility] [place], [place] [address]) [phone] Mon-Fri 5:00-8:00, Sat 12:00-6:00, Sun 10:00-4:00 o Accepting Medicaid . [facility] Medicine at [facility] o [address] [place], [place]  [address] o [phone]   Fax - [phone]  [place] [phone]) . Nature conservation officer at [facility] o [address] [place], [place] [address] o [phone] o Mon-Fri 8:00-5:00 . [facility] - [place] [facility]) o [address] [place], [place] [address] o [phone] o Mon-Thur 8:00-7:00, Fri 8:00-5:00, Sat 8:00-12:00

## 2020-09-24 LAB — CERVICOVAGINAL ANCILLARY ONLY
Bacterial Vaginitis (gardnerella): POSITIVE — AB
Candida Glabrata: NEGATIVE
Candida Vaginitis: NEGATIVE
Chlamydia: NEGATIVE
Comment: NEGATIVE
Comment: NEGATIVE
Comment: NEGATIVE
Comment: NEGATIVE
Comment: NEGATIVE
Comment: NORMAL
Neisseria Gonorrhea: NEGATIVE
Trichomonas: POSITIVE — AB

## 2020-09-25 ENCOUNTER — Telehealth: Payer: Self-pay | Admitting: Lactation Services

## 2020-09-25 ENCOUNTER — Other Ambulatory Visit: Payer: Self-pay | Admitting: Family Medicine

## 2020-09-25 DIAGNOSIS — A599 Trichomoniasis, unspecified: Secondary | ICD-10-CM

## 2020-09-25 HISTORY — DX: Trichomoniasis, unspecified: A59.9

## 2020-09-25 MED ORDER — METRONIDAZOLE 500 MG PO TABS: 500.0000 mg | ORAL_TABLET | Freq: Two times a day (BID) | ORAL | 0 refills | Status: AC

## 2020-09-25 NOTE — Telephone Encounter (Signed)
Called patient to inform her of + testing for Trichomonas and information about treatment and partner treatment.   Patient did not answer and mailbox full so not able to leave a message. Will need to attempt to call once more and if not available send a letter.

## 2020-09-25 NOTE — Telephone Encounter (Signed)
-----   Message from Alric Seton, MD sent at 09/25/2020  1:24 AM EDT ----- Please call patient and notify her testing was positive for trichomonas, a sexually transmitted infection. Please notify I have sent prescription for flagyl to patient's pharmacy, she should complete entire course. She should also notify partners so they can be tested, refrain from intercourse for 2 weeks after all partners treated. This could be causing some of patient's vaginal/urinary symptoms.   Alric Seton, MD OB Fellow, Faculty [facility] Center for [facility] 09/25/2020 1:24 AM

## 2020-09-25 NOTE — Progress Notes (Signed)
Rx sent to pharmacy for flagyl.

## 2020-09-26 LAB — URINE CULTURE

## 2020-09-26 NOTE — Telephone Encounter (Signed)
Called pt and informed her of test result of +Trichomonas. She was advised that Rx has been sent to her pharmacy - dose instructions discussed. She also was advised that her partner will require treatment. She should abstain from sex until 2 weeks after they bath have completed treatment. Pt voiced understanding.

## 2020-09-27 ENCOUNTER — Other Ambulatory Visit: Payer: Self-pay | Admitting: Family Medicine

## 2020-09-27 DIAGNOSIS — B9620 Unspecified Escherichia coli [E. coli] as the cause of diseases classified elsewhere: Secondary | ICD-10-CM

## 2020-09-27 DIAGNOSIS — N390 Urinary tract infection, site not specified: Secondary | ICD-10-CM

## 2020-09-27 MED ORDER — AMOXICILLIN-POT CLAVULANATE 500-125 MG PO TABS: 1.0000 | ORAL_TABLET | Freq: Two times a day (BID) | ORAL | 0 refills | Status: DC

## 2020-09-27 NOTE — Progress Notes (Signed)
Rx sent for augmentin to pharmacy for E coli UTI.  Alric Seton, MD OB Fellow, Faculty [facility], Center for [facility] 09/27/2020 9:53 PM

## 2020-09-29 ENCOUNTER — Telehealth: Payer: Self-pay | Admitting: Lactation Services

## 2020-09-29 NOTE — Telephone Encounter (Signed)
Called patient and informed her that her urine does show Ecoli UTI. Reviewed Augmentin has been sent to her pharmacy and to take with food. Reviewed it can cause nausea or diarrhea and that eating yogurt or taking Probiotic 1-2 hours after dosage may help not develop Diarrhea.   Patient reports she is wiping from front to back when using the restroom.   Patient to pick up ATB today and beging taking. Patient to call with any questions or concerns as needed.

## 2020-09-29 NOTE — Telephone Encounter (Signed)
-----   Message from Alric Seton, MD sent at 09/27/2020  9:53 PM EDT ----- Please call patient and notify of E coli UTI, rx sent to pharmacy for augmentin. Pick up and take entirety of antibiotic with food.  Alric Seton, MD OB Fellow, Faculty [facility], Center for [facility] 09/27/2020 9:53 PM

## 2022-01-08 ENCOUNTER — Inpatient Hospital Stay (HOSPITAL_COMMUNITY)
Admission: EM | Admit: 2022-01-08 | Discharge: 2022-01-11 | DRG: 281 | Disposition: A | Discharge status: Home / Self Care | Payer: Self-pay | Attending: Cardiovascular Disease | Admitting: Cardiovascular Disease

## 2022-01-08 ENCOUNTER — Encounter (HOSPITAL_COMMUNITY): Payer: Self-pay | Admitting: *Deleted

## 2022-01-08 ENCOUNTER — Emergency Department (HOSPITAL_COMMUNITY): Payer: Self-pay

## 2022-01-08 ENCOUNTER — Other Ambulatory Visit: Payer: Self-pay

## 2022-01-08 DIAGNOSIS — I10 Essential (primary) hypertension: Secondary | ICD-10-CM

## 2022-01-08 DIAGNOSIS — Z8249 Family history of ischemic heart disease and other diseases of the circulatory system: Secondary | ICD-10-CM

## 2022-01-08 DIAGNOSIS — E872 Acidosis, unspecified: Secondary | ICD-10-CM | POA: Diagnosis present

## 2022-01-08 DIAGNOSIS — Z833 Family history of diabetes mellitus: Secondary | ICD-10-CM

## 2022-01-08 DIAGNOSIS — I2510 Atherosclerotic heart disease of native coronary artery without angina pectoris: Secondary | ICD-10-CM

## 2022-01-08 DIAGNOSIS — F17210 Nicotine dependence, cigarettes, uncomplicated: Secondary | ICD-10-CM | POA: Diagnosis present

## 2022-01-08 DIAGNOSIS — E785 Hyperlipidemia, unspecified: Secondary | ICD-10-CM

## 2022-01-08 DIAGNOSIS — Z720 Tobacco use: Secondary | ICD-10-CM

## 2022-01-08 DIAGNOSIS — I214 Non-ST elevation (NSTEMI) myocardial infarction: Principal | ICD-10-CM | POA: Diagnosis present

## 2022-01-08 DIAGNOSIS — Z79899 Other long term (current) drug therapy: Secondary | ICD-10-CM

## 2022-01-08 DIAGNOSIS — R599 Enlarged lymph nodes, unspecified: Secondary | ICD-10-CM

## 2022-01-08 DIAGNOSIS — Z6841 Body Mass Index (BMI) 40.0 and over, adult: Secondary | ICD-10-CM

## 2022-01-08 LAB — BASIC METABOLIC PANEL
Anion gap: 8 (ref 5–15)
BUN: 11 mg/dL (ref 6–20)
CO2: 20 mmol/L — ABNORMAL LOW (ref 22–32)
Calcium: 8.5 mg/dL — ABNORMAL LOW (ref 8.9–10.3)
Chloride: 110 mmol/L (ref 98–111)
Creatinine, Ser: 0.86 mg/dL (ref 0.44–1.00)
GFR, Estimated: >60 mL/min (ref >60)
Glucose, Bld: 110 mg/dL — ABNORMAL HIGH (ref 70–99)
Potassium: 3.9 mmol/L (ref 3.5–5.1)
Sodium: 138 mmol/L (ref 135–145)

## 2022-01-08 LAB — CBC
HCT: 36.4 % (ref 36.0–46.0)
Hemoglobin: 11.6 g/dL — ABNORMAL LOW (ref 12.0–15.0)
MCH: 30 pg (ref 26.0–34.0)
MCHC: 31.9 g/dL (ref 30.0–36.0)
MCV: 94.1 fL (ref 80.0–100.0)
Platelets: 142 10*3/uL — ABNORMAL LOW (ref 150–400)
RBC: 3.87 MIL/uL (ref 3.87–5.11)
RDW: 13.7 % (ref 11.5–15.5)
WBC: 6.6 10*3/uL (ref 4.0–10.5)
nRBC: 0 % (ref 0.0–0.2)

## 2022-01-08 LAB — TROPONIN I (HIGH SENSITIVITY): Troponin I (High Sensitivity): 77 ng/L — ABNORMAL HIGH (ref <18)

## 2022-01-08 LAB — I-STAT BETA HCG BLOOD, ED (MC, WL, AP ONLY): I-stat hCG, quantitative: <5 m[IU]/mL (ref <5)

## 2022-01-08 MED ORDER — HEPARIN BOLUS VIA INFUSION
4000.0000 [IU] | Freq: Once | INTRAVENOUS | Status: AC
  Administered 2022-01-09: 4000 [IU] via INTRAVENOUS
  Filled 2022-01-08: qty 4000

## 2022-01-08 MED ORDER — NITROGLYCERIN 2 % TD OINT
1.0000 [in_us] | TOPICAL_OINTMENT | Freq: Once | TRANSDERMAL | Status: AC
  Administered 2022-01-09: 1 [in_us] via TOPICAL
  Filled 2022-01-08: qty 1

## 2022-01-08 MED ORDER — HEPARIN (PORCINE) 25000 UT/250ML-% IV SOLN
1450.0000 [IU]/h | INTRAVENOUS | Status: DC
  Administered 2022-01-09: 1350 [IU]/h via INTRAVENOUS
  Administered 2022-01-09: 1300 [IU]/h via INTRAVENOUS
  Administered 2022-01-10 – 2022-01-11 (×2): 1450 [IU]/h via INTRAVENOUS
  Filled 2022-01-08 (×4): qty 250

## 2022-01-08 MED ORDER — NITROGLYCERIN 0.4 MG SL SUBL
0.4000 mg | SUBLINGUAL_TABLET | SUBLINGUAL | Status: DC | PRN
  Administered 2022-01-08 – 2022-01-09 (×3): 0.4 mg via SUBLINGUAL
  Filled 2022-01-08 (×4): qty 1

## 2022-01-08 MED ORDER — ONDANSETRON HCL 4 MG/2ML IJ SOLN
4.0000 mg | Freq: Once | INTRAMUSCULAR | Status: AC
  Administered 2022-01-08: 4 mg via INTRAVENOUS
  Filled 2022-01-08: qty 2

## 2022-01-08 NOTE — ED Provider Notes (Incomplete)
  [facility] EMERGENCY DEPARTMENT Provider Note   CSN: [id number] Arrival date & time: 01/08/22  2136     History {Add pertinent medical, surgical, social history, OB history to HPI:1} Chief Complaint  Patient presents with  . Chest Pain    [name] is a 42 y.o. female.  HPI     Home Medications Prior to Admission medications   Medication Sig Start Date End Date Taking? Authorizing Provider  amoxicillin-clavulanate (AUGMENTIN) 500-125 MG tablet Take 1 tablet (500 mg total) by mouth in the morning and at bedtime. 09/27/20   Alric Seton, MD  ibuprofen (ADVIL,MOTRIN) 800 MG tablet Take 1 tablet (800 mg total) by mouth every 6 (six) hours as needed. 09/26/14   Horton, Mayer Masker, MD  levonorgestrel (MIRENA) 20 MCG/24HR IUD 1 each by Intrauterine route once.    [provider]  nitrofurantoin, macrocrystal-monohydrate, (MACROBID) 100 MG capsule Take 1 capsule (100 mg total) by mouth 2 (two) times daily. 08/12/20   [name], MD  oxyCODONE-acetaminophen (PERCOCET/ROXICET) 5-325 MG per tablet Take 1 tablet by mouth every 6 (six) hours as needed for severe pain. Patient not taking: No sig reported 09/26/14   Horton, Mayer Masker, MD  phenazopyridine (PYRIDIUM) 200 MG tablet Take 1 tablet (200 mg total) by mouth 3 (three) times daily as needed for pain. Patient not taking: Reported on 09/23/2020 08/12/20   [name], MD      Allergies    Patient has no known allergies.    Review of Systems   Review of Systems  Physical Exam Updated Vital Signs BP (!) 181/80 (BP Location: Right Arm)   Pulse 61   Temp 98.7 F (37.1 C) (Oral)   Resp (!) 26   Ht 5\' 8"  (1.727 m)   Wt 121.1 kg   LMP 12/11/2021   SpO2 99%   BMI 40.59 kg/m  Physical Exam  ED Results / Procedures / Treatments   Labs (all labs ordered are listed, but only abnormal results are displayed) Labs Reviewed - No data to display  EKG None  Radiology No results  found.  Procedures Procedures  {Document cardiac monitor, telemetry assessment procedure when appropriate:1}  Medications Ordered in ED Medications - No data to display  ED Course/ Medical Decision Making/ A&P                           Medical Decision Making  ***  {Document critical care time when appropriate:1} {Document review of labs and clinical decision tools ie heart score, Chads2Vasc2 etc:1}  {Document your independent review of radiology images, and any outside records:1} {Document your discussion with family members, caretakers, and with consultants:1} {Document social determinants of health affecting pt's care:1} {Document your decision making why or why not admission, treatments were needed:1} Final Clinical Impression(s) / ED Diagnoses Final diagnoses:  None    Rx / DC Orders ED Discharge Orders     None

## 2022-01-08 NOTE — ED Triage Notes (Signed)
The pt arrived by [facility] from home  pain in her chest  since 1200 noon today  after she ate chicken mcnuggets ems gave her  324mg  of aspirin and  nitro x 2  the pts pain is now 4/10  no history of anything  lmp  July 7th

## 2022-01-08 NOTE — ED Notes (Signed)
Pt vomiting currectly

## 2022-01-08 NOTE — Progress Notes (Signed)
ANTICOAGULATION CONSULT NOTE - Initial Consult  Pharmacy Consult for heparin Indication: chest pain/ACS  No Known Allergies  Patient Measurements: Height: 5\' 8"  (172.7 cm) Weight: 121.1 kg (266 lb 15.6 oz) IBW/kg (Calculated) : 63.9 Heparin Dosing Weight: 95kg  Vital Signs: Temp: 98.7 F (37.1 C) (08/04 2223) Temp Source: Oral (08/04 2145) BP: 157/89 (08/04 2330) Pulse Rate: 62 (08/04 2225)  Labs: Recent Labs    01/08/22 2218  HGB 11.6*  HCT 36.4  PLT 142*  CREATININE 0.86  TROPONINIHS 77*    Estimated Creatinine Clearance: 119.2 mL/min (by C-G formula based on SCr of 0.86 mg/dL).   Medical History: Past Medical History:  Diagnosis Date   Anemia    Low back pain     Assessment: 41yo female c/o CP since noon, troponin elevated >> to begin heparin.  Goal of Therapy:  Heparin level 0.3-0.7 units/ml Monitor platelets by anticoagulation protocol: Yes   Plan:  Heparin 4000 units IV bolus x1 followed by infusion at 1300 units/hr. Monitor heparin levels and CBC.  42yo, PharmD, BCPS  01/08/2022,11:49 PM

## 2022-01-08 NOTE — ED Provider Notes (Signed)
[facility] EMERGENCY DEPARTMENT Provider Note   CSN: [id number] Arrival date & time: 01/08/22  2136     History  Chief Complaint  Patient presents with   Chest Pain    [name] is a 41 y.o. female who presents with her children at the bedside with concern forCentral chest pain and pressure started around noon today after she ate lunch.  Pain progressively worsened, initially was burning sensation then progressed to pressure and started stabbing pain that radiated through to her back.  No history of similar presentation in the past.  No history of cardiac etiology before.  In EMS, received 324 of aspirin and nitro sublingually x2 with initial improvement in patient's pain to 4/10.  Time of my initial evaluation patient was asymptomatic and work-up was initiated, however subsequently developed central chest burning pain and pressure, was diaphoretic, and complaining of pain radiating to the back.  I personally reviewed this patient's medical records.  History of low back pain and anemia.  She is not currently on any medications daily.  Endorses that this was a very stressful week for her  HPI     Home Medications Prior to Admission medications   Medication Sig Start Date End Date Taking? Authorizing Provider  amoxicillin-clavulanate (AUGMENTIN) 500-125 MG tablet Take 1 tablet (500 mg total) by mouth in the morning and at bedtime. 09/27/20   Alric Seton, MD  ibuprofen (ADVIL,MOTRIN) 800 MG tablet Take 1 tablet (800 mg total) by mouth every 6 (six) hours as needed. 09/26/14   Horton, Mayer Masker, MD  levonorgestrel (MIRENA) 20 MCG/24HR IUD 1 each by Intrauterine route once.    [provider]  nitrofurantoin, macrocrystal-monohydrate, (MACROBID) 100 MG capsule Take 1 capsule (100 mg total) by mouth 2 (two) times daily. 08/12/20   [name], MD  oxyCODONE-acetaminophen (PERCOCET/ROXICET) 5-325 MG per tablet Take 1 tablet by mouth every 6 (six)  hours as needed for severe pain. Patient not taking: No sig reported 09/26/14   Horton, Mayer Masker, MD  phenazopyridine (PYRIDIUM) 200 MG tablet Take 1 tablet (200 mg total) by mouth 3 (three) times daily as needed for pain. Patient not taking: Reported on 09/23/2020 08/12/20   [name], MD      Allergies    Patient has no known allergies.    Review of Systems   Review of Systems  Constitutional: Negative.   HENT: Negative.    Respiratory: Negative.    Cardiovascular:  Positive for chest pain. Negative for palpitations and leg swelling.  Gastrointestinal:  Positive for nausea and vomiting.  Genitourinary: Negative.   Musculoskeletal: Negative.   Skin: Negative.   Neurological: Negative.   Hematological: Negative.     Physical Exam Updated Vital Signs BP (!) 171/107   Pulse (!) 55   Temp 98.7 F (37.1 C)   Resp 19   Ht 5\' 8"  (1.727 m)   Wt 121.1 kg   LMP 12/11/2021   SpO2 100%   BMI 40.59 kg/m  Physical Exam Vitals and nursing note reviewed.  Constitutional:      General: She is in acute distress.     Appearance: She is obese. She is diaphoretic. She is not ill-appearing or toxic-appearing.     Comments: Clutching chest, diaphoretic  HENT:     Head: Normocephalic and atraumatic.     Mouth/Throat:     Mouth: Mucous membranes are moist.     Pharynx: No oropharyngeal exudate or posterior oropharyngeal erythema.  Eyes:     General:        Right eye: No discharge.        Left eye: No discharge.     Conjunctiva/sclera: Conjunctivae normal.  Cardiovascular:     Rate and Rhythm: Normal rate and regular rhythm.     Pulses: Normal pulses.     Heart sounds: Normal heart sounds. No murmur heard. Pulmonary:     Effort: Pulmonary effort is normal. No respiratory distress.     Breath sounds: Normal breath sounds. No wheezing or rales.  Abdominal:     General: Bowel sounds are normal. There is no distension.     Tenderness: There is no abdominal tenderness.   Musculoskeletal:        General: No deformity.     Cervical back: Neck supple.     Right lower leg: No tenderness. No edema.     Left lower leg: No tenderness. No edema.  Skin:    General: Skin is warm.     Capillary Refill: Capillary refill takes less than 2 seconds.  Neurological:     General: No focal deficit present.     Mental Status: She is alert and oriented to person, place, and time. Mental status is at baseline.  Psychiatric:        Mood and Affect: Mood normal.     ED Results / Procedures / Treatments   Labs (all labs ordered are listed, but only abnormal results are displayed) Labs Reviewed  BASIC METABOLIC PANEL - Abnormal; Notable for the following components:      Result Value   CO2 20 (*)    Glucose, Bld 110 (*)    Calcium 8.5 (*)    All other components within normal limits  CBC - Abnormal; Notable for the following components:   Hemoglobin 11.6 (*)    Platelets 142 (*)    All other components within normal limits  TROPONIN I (HIGH SENSITIVITY) - Abnormal; Notable for the following components:   Troponin I (High Sensitivity) 77 (*)    All other components within normal limits  HEPARIN LEVEL (UNFRACTIONATED)  CBC  RAPID URINE DRUG SCREEN, HOSP PERFORMED  I-STAT BETA HCG BLOOD, ED ([facility], [facility], [facility] ONLY)  TROPONIN I (HIGH SENSITIVITY)    EKG EKG Interpretation  Date/Time:  Friday January 08 2022 21:41:22 EDT Ventricular Rate:  59 PR Interval:  156 QRS Duration: 104 QT Interval:  449 QTC Calculation: 445 R Axis:   54 Text Interpretation: Sinus rhythm Normal ECG Confirmed by Gilda Crease [id number]) on 01/08/2022 11:42:13 PM  Radiology DG Chest 2 View  Result Date: 01/08/2022 CLINICAL DATA:  Chest pain EXAM: CHEST - 2 VIEW COMPARISON:  09/26/2014 FINDINGS: The heart size and mediastinal contours are within normal limits. Both lungs are clear. The visualized skeletal structures are unremarkable. IMPRESSION: No active cardiopulmonary disease.  Electronically Signed   By: Alcide Clever M.D.   On: 01/08/2022 22:45    Procedures Procedures    Medications Ordered in ED Medications  nitroGLYCERIN (NITROSTAT) SL tablet 0.4 mg (0.4 mg Sublingual Given 01/08/22 2309)  heparin ADULT infusion 100 units/mL (25000 units/255mL) (1,300 Units/hr Intravenous New Bag/Given 01/09/22 0058)  ondansetron (ZOFRAN) injection 4 mg (4 mg Intravenous Given 01/08/22 2310)  nitroGLYCERIN (NITROGLYN) 2 % ointment 1 inch (1 inch Topical Given 01/09/22 0050)  heparin bolus via infusion 4,000 Units (4,000 Units Intravenous Bolus from Bag 01/09/22 0059)    ED Course/ Medical Decision Making/ A&P Clinical Course as of  01/09/22 0103  Fri Jan 08, 2022  2324 Call from cardiology - dealing with critical patient on the floor, will complete consult in ~30 minutes.  [RS]  Sat Jan 09, 2022  0017 Consult call from cardiology, Dr. Hector Brunswick, who is agreeable to admitting this patient to his service. I appreciate his collaboration in the care of this patient.  [RS]    Clinical Course User Index [RS] Raetta Agostinelli, Eugene Gavia, PA-C                           Medical Decision Making 41 year old female with several hours of central chest pain and pressure improved with nitroglycerin in route.  Hypertensive and tachypneic on intake, vital signs otherwise normal.  Cardiac exam is unremarkable, pulmonary exam is normal, abdominal exam is benign.  Patient is diaphoretic and appears acutely ill at time of my initial evaluation clutching her chest.  Unable to collect EKG during this episode given issue with cardiac monitor, however tracing on cardiac monitor remained normal without ST elevation throughout my time in the room.  Differential gnosis includes was limited to ACS, PE, pleural effusion, pneumothorax, pneumonia, dysrhythmia.  Amount and/or Complexity of Data Reviewed Labs: ordered. Radiology:     Details: Chest x-ray visualized by the provider negative for acute cardiopulmonary  disease ECG/medicine tests:     Details: EKG is normal with normal sinus rhythm without ST changes.  Risk Prescription drug management. Decision regarding hospitalization.   Clinical presentation with acute elevation of troponin to 77 concerning for NSTEMI as etiology for patient's symptomatology.  Will apply Nitropaste and started on heparin drip.  Cardiology consulted as above who will admit the patient to their service.  [name] remains chest pain-free at this time following nitroglycerin in the ED.  We will continue to monitor.  Patient will require admission to the hospital for her emergent presentation with cardiac etiology for chest pain. [name]   voiced understanding of her medical evaluation and treatment plan. Each of their questions answered to their expressed satisfaction.  She is amenable to plan for admission at this time.   This chart was dictated using voice recognition software, Dragon. Despite the best efforts of this provider to proofread and correct errors, errors may still occur which can change documentation meaning.    Final Clinical Impression(s) / ED Diagnoses Final diagnoses:  NSTEMI (non-ST elevated myocardial infarction) [facility])    Rx / DC Orders ED Discharge Orders     None         Paris Lore, PA-C 01/09/22 0103    Gilda Crease, MD 01/10/22 [phone]

## 2022-01-09 ENCOUNTER — Other Ambulatory Visit (HOSPITAL_COMMUNITY): Payer: Self-pay

## 2022-01-09 ENCOUNTER — Inpatient Hospital Stay (HOSPITAL_COMMUNITY): Payer: Self-pay

## 2022-01-09 ENCOUNTER — Encounter (HOSPITAL_COMMUNITY): Payer: Self-pay | Admitting: Internal Medicine

## 2022-01-09 DIAGNOSIS — I214 Non-ST elevation (NSTEMI) myocardial infarction: Secondary | ICD-10-CM | POA: Diagnosis present

## 2022-01-09 DIAGNOSIS — M7989 Other specified soft tissue disorders: Secondary | ICD-10-CM

## 2022-01-09 LAB — COMPREHENSIVE METABOLIC PANEL
ALT: 12 U/L (ref 0–44)
AST: 25 U/L (ref 15–41)
Albumin: 3.4 g/dL — ABNORMAL LOW (ref 3.5–5.0)
Alkaline Phosphatase: 65 U/L (ref 38–126)
Anion gap: 6 (ref 5–15)
BUN: 11 mg/dL (ref 6–20)
CO2: 20 mmol/L — ABNORMAL LOW (ref 22–32)
Calcium: 8.9 mg/dL (ref 8.9–10.3)
Chloride: 110 mmol/L (ref 98–111)
Creatinine, Ser: 0.97 mg/dL (ref 0.44–1.00)
GFR, Estimated: >60 mL/min (ref >60)
Glucose, Bld: 100 mg/dL — ABNORMAL HIGH (ref 70–99)
Potassium: 4 mmol/L (ref 3.5–5.1)
Sodium: 136 mmol/L (ref 135–145)
Total Bilirubin: 0.6 mg/dL (ref 0.3–1.2)
Total Protein: 6.5 g/dL (ref 6.5–8.1)

## 2022-01-09 LAB — LIPID PANEL
Cholesterol: 142 mg/dL (ref 0–200)
HDL: 54 mg/dL (ref >40)
LDL Cholesterol: 85 mg/dL (ref 0–99)
Total CHOL/HDL Ratio: 2.6 RATIO
Triglycerides: 16 mg/dL (ref <150)
VLDL: 3 mg/dL (ref 0–40)

## 2022-01-09 LAB — HEPARIN LEVEL (UNFRACTIONATED)
Heparin Unfractionated: 0.39 IU/mL (ref 0.30–0.70)
Heparin Unfractionated: 0.31 IU/mL (ref 0.30–0.70)

## 2022-01-09 LAB — MAGNESIUM: Magnesium: 1.6 mg/dL — ABNORMAL LOW (ref 1.7–2.4)

## 2022-01-09 LAB — CBC WITH DIFFERENTIAL/PLATELET
Abs Immature Granulocytes: 0.03 10*3/uL (ref 0.00–0.07)
Basophils Absolute: 0 10*3/uL (ref 0.0–0.1)
Basophils Relative: 0 %
Eosinophils Absolute: 0 10*3/uL (ref 0.0–0.5)
Eosinophils Relative: 0 %
HCT: 33.6 % — ABNORMAL LOW (ref 36.0–46.0)
Hemoglobin: 11 g/dL — ABNORMAL LOW (ref 12.0–15.0)
Immature Granulocytes: 0 %
Lymphocytes Relative: 18 %
Lymphs Abs: 1.5 10*3/uL (ref 0.7–4.0)
MCH: 29.6 pg (ref 26.0–34.0)
MCHC: 32.7 g/dL (ref 30.0–36.0)
MCV: 90.6 fL (ref 80.0–100.0)
Monocytes Absolute: 0.5 10*3/uL (ref 0.1–1.0)
Monocytes Relative: 6 %
Neutro Abs: 6.5 10*3/uL (ref 1.7–7.7)
Neutrophils Relative %: 76 %
Platelets: 136 10*3/uL — ABNORMAL LOW (ref 150–400)
RBC: 3.71 MIL/uL — ABNORMAL LOW (ref 3.87–5.11)
RDW: 13.5 % (ref 11.5–15.5)
WBC: 8.5 10*3/uL (ref 4.0–10.5)
nRBC: 0 % (ref 0.0–0.2)

## 2022-01-09 LAB — TROPONIN I (HIGH SENSITIVITY)
Troponin I (High Sensitivity): 821 ng/L (ref <18)
Troponin I (High Sensitivity): 2558 ng/L (ref <18)

## 2022-01-09 LAB — RAPID URINE DRUG SCREEN, HOSP PERFORMED
Amphetamines: NOT DETECTED
Barbiturates: NOT DETECTED
Benzodiazepines: NOT DETECTED
Cocaine: NOT DETECTED
Opiates: NOT DETECTED
Tetrahydrocannabinol: POSITIVE — AB

## 2022-01-09 LAB — BLOOD GAS, VENOUS
Acid-base deficit: 4.2 mmol/L — ABNORMAL HIGH (ref 0.0–2.0)
Bicarbonate: 20.1 mmol/L (ref 20.0–28.0)
Drawn by: 1390
O2 Saturation: 96.9 %
Patient temperature: 37.1
pCO2, Ven: 34 mmHg — ABNORMAL LOW (ref 44–60)
pH, Ven: 7.38 (ref 7.25–7.43)
pO2, Ven: 76 mmHg — ABNORMAL HIGH (ref 32–45)

## 2022-01-09 LAB — HEMOGLOBIN A1C
Hgb A1c MFr Bld: 4.7 % — ABNORMAL LOW (ref 4.8–5.6)
Mean Plasma Glucose: 88.19 mg/dL

## 2022-01-09 LAB — HIV ANTIBODY (ROUTINE TESTING W REFLEX): HIV Screen 4th Generation wRfx: NONREACTIVE

## 2022-01-09 MED ORDER — SODIUM CHLORIDE 0.9% FLUSH
3.0000 mL | Freq: Two times a day (BID) | INTRAVENOUS | Status: DC
Start: 2022-01-09 — End: 2022-01-12
  Administered 2022-01-09 – 2022-01-10 (×2): 3 mL via INTRAVENOUS

## 2022-01-09 MED ORDER — FENTANYL CITRATE PF 50 MCG/ML IJ SOSY
25.0000 ug | PREFILLED_SYRINGE | Freq: Once | INTRAMUSCULAR | Status: AC
  Administered 2022-01-09: 25 ug via INTRAVENOUS
  Filled 2022-01-09: qty 1

## 2022-01-09 MED ORDER — SODIUM CHLORIDE 0.9 % WEIGHT BASED INFUSION
1.0000 mL/kg/h | INTRAVENOUS | Status: DC
  Administered 2022-01-11: 1 mL/kg/h via INTRAVENOUS

## 2022-01-09 MED ORDER — ROSUVASTATIN CALCIUM 20 MG PO TABS: 20.0000 mg | ORAL_TABLET | Freq: Every day | ORAL | Status: DC

## 2022-01-09 MED ORDER — ISOSORBIDE MONONITRATE 20 MG PO TABS
20.0000 mg | ORAL_TABLET | Freq: Two times a day (BID) | ORAL | Status: DC
  Filled 2022-01-09: qty 1

## 2022-01-09 MED ORDER — LOSARTAN POTASSIUM 50 MG PO TABS
50.0000 mg | ORAL_TABLET | Freq: Every day | ORAL | Status: DC
  Administered 2022-01-09 – 2022-01-11 (×3): 50 mg via ORAL
  Filled 2022-01-09 (×3): qty 1

## 2022-01-09 MED ORDER — SODIUM CHLORIDE 0.9 % WEIGHT BASED INFUSION
3.0000 mL/kg/h | INTRAVENOUS | Status: DC
  Administered 2022-01-11: 3 mL/kg/h via INTRAVENOUS

## 2022-01-09 MED ORDER — SODIUM CHLORIDE 0.9% FLUSH
3.0000 mL | INTRAVENOUS | Status: DC | PRN
Start: 2022-01-09 — End: 2022-01-11

## 2022-01-09 MED ORDER — ASPIRIN 325 MG PO TABS
325.0000 mg | ORAL_TABLET | Freq: Once | ORAL | Status: AC
  Administered 2022-01-09: 325 mg via ORAL
  Filled 2022-01-09: qty 1

## 2022-01-09 MED ORDER — METOPROLOL TARTRATE 25 MG PO TABS
25.0000 mg | ORAL_TABLET | Freq: Two times a day (BID) | ORAL | Status: DC
  Administered 2022-01-09 (×3): 25 mg via ORAL
  Filled 2022-01-09 (×4): qty 1

## 2022-01-09 MED ORDER — ROSUVASTATIN CALCIUM 20 MG PO TABS
40.0000 mg | ORAL_TABLET | Freq: Every day | ORAL | Status: DC
  Administered 2022-01-09 – 2022-01-10 (×2): 40 mg via ORAL
  Filled 2022-01-09 (×2): qty 2

## 2022-01-09 MED ORDER — ASPIRIN 81 MG PO CHEW
81.0000 mg | CHEWABLE_TABLET | Freq: Every day | ORAL | Status: DC
  Administered 2022-01-10 – 2022-01-11 (×2): 81 mg via ORAL
  Filled 2022-01-09 (×2): qty 1

## 2022-01-09 MED ORDER — SODIUM CHLORIDE 0.9 % WEIGHT BASED INFUSION: 1.0000 mL/kg/h | INTRAVENOUS | Status: DC

## 2022-01-09 MED ORDER — NITROGLYCERIN IN D5W 200-5 MCG/ML-% IV SOLN
0.0000 ug/min | INTRAVENOUS | Status: DC
  Administered 2022-01-09: 20 ug/min via INTRAVENOUS
  Administered 2022-01-10: 30 ug/min via INTRAVENOUS
  Administered 2022-01-11: 35 ug/min via INTRAVENOUS
  Filled 2022-01-09 (×2): qty 250

## 2022-01-09 MED ORDER — SODIUM CHLORIDE 0.9 % WEIGHT BASED INFUSION
3.0000 mL/kg/h | INTRAVENOUS | Status: DC
Start: 2022-01-10 — End: 2022-01-09

## 2022-01-09 MED ORDER — SODIUM CHLORIDE 0.9 % IV SOLN
250.0000 mL | INTRAVENOUS | Status: DC | PRN
Start: 2022-01-09 — End: 2022-01-11

## 2022-01-09 MED ORDER — MORPHINE SULFATE (PF) 2 MG/ML IV SOLN
2.0000 mg | INTRAVENOUS | Status: DC | PRN
  Administered 2022-01-09: 4 mg via INTRAVENOUS
  Filled 2022-01-09: qty 2

## 2022-01-09 MED ORDER — ONDANSETRON HCL 4 MG/2ML IJ SOLN: 4.0000 mg | Freq: Four times a day (QID) | INTRAMUSCULAR | Status: DC | PRN

## 2022-01-09 MED ORDER — ASPIRIN 81 MG PO CHEW: 81.0000 mg | CHEWABLE_TABLET | ORAL | Status: DC

## 2022-01-09 MED ORDER — ASPIRIN 81 MG PO CHEW
81.0000 mg | CHEWABLE_TABLET | ORAL | Status: AC
  Administered 2022-01-11: 81 mg via ORAL
  Filled 2022-01-09: qty 1

## 2022-01-09 MED ORDER — ACETAMINOPHEN 325 MG PO TABS
650.0000 mg | ORAL_TABLET | ORAL | Status: DC | PRN
  Administered 2022-01-09 – 2022-01-11 (×6): 650 mg via ORAL
  Filled 2022-01-09 (×6): qty 2

## 2022-01-09 NOTE — H&P (Addendum)
Cardiology Admission History and Physical:   Patient ID: [name] MRN: [id number]; DOB: 02-11-1981   Admission date: 01/08/2022  PCP:  Default, Provider, MD   [facility] Providers Cardiologist:  None        Chief Complaint:  chest pain  Patient Profile:   [name] is a 41 y.o. female with no significant past medical history who is being seen 01/09/2022 for the evaluation of sharp substernal chest pain.  History of Present Illness:   Ms. Lobue reports being under a lot of stress recently.  There was a good day of and she was doing pretty well early in the morning started feeling episode of chest pain however attributed to heartburn.  Discomfort quickly resolved.  Around 7:00 p.m. patient experienced episode of sharp midsternal t pain associated with diaphoresis.  Patient felt like she was about to pass out she laid around.  In addition patient had an episode of emesis, NBNB Patient does not have any significant family history of cardiac disease.  Significant family history of hypertension. No family history of sudden cardiac death . following EMS arrival patient received nitroglycerin with problems resolution chest pain.  Noted episode of chest pain while in emergency room.following administration of nitroglycerin chest discomfort resolved.  Subsequently while in emergency room patient is hemodynamically stable, noted to be hypertensive no acute distress.  Initial elevated troponin 77.  Otherwise CBC and CMP unremarkable.  Evidence of metabolic acidosis noted with bicarb of 20. EKG unremarkable.  Telemetry unremarkable   Past Medical History:  Diagnosis Date   Anemia    Low back pain     Past Surgical History:  Procedure Laterality Date   CESAREAN SECTION  2003, 2004, 2009     Medications Prior to Admission: Prior to Admission medications   Medication Sig Start Date End Date Taking? Authorizing Provider  amoxicillin-clavulanate (AUGMENTIN) 500-125 MG tablet  Take 1 tablet (500 mg total) by mouth in the morning and at bedtime. 09/27/20   Alric Seton, MD  ibuprofen (ADVIL,MOTRIN) 800 MG tablet Take 1 tablet (800 mg total) by mouth every 6 (six) hours as needed. 09/26/14   Horton, Mayer Masker, MD  levonorgestrel (MIRENA) 20 MCG/24HR IUD 1 each by Intrauterine route once.    [provider]  nitrofurantoin, macrocrystal-monohydrate, (MACROBID) 100 MG capsule Take 1 capsule (100 mg total) by mouth 2 (two) times daily. 08/12/20   [name], MD  oxyCODONE-acetaminophen (PERCOCET/ROXICET) 5-325 MG per tablet Take 1 tablet by mouth every 6 (six) hours as needed for severe pain. Patient not taking: No sig reported 09/26/14   Horton, Mayer Masker, MD  phenazopyridine (PYRIDIUM) 200 MG tablet Take 1 tablet (200 mg total) by mouth 3 (three) times daily as needed for pain. Patient not taking: Reported on 09/23/2020 08/12/20   [name], MD     Allergies:   No Known Allergies  Social History:   Social History   Socioeconomic History   Marital status: Single    Spouse name: Not on file   Number of children: Not on file   Years of education: Not on file   Highest education level: Not on file  Occupational History   Not on file  Tobacco Use   Smoking status: Every Day    Types: Cigarettes   Smokeless tobacco: Never  Substance and Sexual Activity   Alcohol use: No   Drug use: No   Sexual activity: Yes    Birth control/protection: I.U.D.  Other  Topics Concern   Not on file  Social History Narrative   Not on file   Social Determinants of Health   Financial Resource Strain: Not on file  Food Insecurity: Food Insecurity Present (09/23/2020)   Hunger Vital Sign    Worried About Running Out of Food in the Last Year: Sometimes true    Ran Out of Food in the Last Year: Sometimes true  Transportation Needs: No Transportation Needs (09/23/2020)   PRAPARE - Administrator, Civil Service (Medical): No    Lack of  Transportation (Non-Medical): No  Physical Activity: Not on file  Stress: Not on file  Social Connections: Not on file  Intimate Partner Violence: Not on file    Family History:   The patient's family history is not on file.   Family history of hypertension type 2 diabetes  ROS:  Please see the history of present illness.  All other ROS reviewed and negative.     Physical Exam/Data:   Vitals:   01/08/22 2315 01/08/22 2330 01/09/22 0000 01/09/22 0116  BP:  (!) 157/89 (!) 171/107 (!) 169/90  Pulse:   (!) 55 66  Resp: 12 12 19 19   Temp:      TempSrc:      SpO2:   100% 100%  Weight:      Height:       No intake or output data in the 24 hours ending 01/09/22 0204    01/08/2022    9:41 PM 09/23/2020    9:56 AM 08/12/2020   10:37 AM  Last 3 Weights  Weight (lbs) 266 lb 15.6 oz 267 lb 260 lb  Weight (kg) 121.1 kg 121.11 kg 117.935 kg     Body mass index is 40.59 kg/m.  General:  Well nourished, well developed, in no acute distress HEENT: normal Neck: no JVD Vascular: No carotid bruits; Distal pulses 2+ bilaterally   Cardiac:  normal S1, S2; RRR; no murmur  Lungs:  clear to auscultation bilaterally, no wheezing, rhonchi or rales  Abd: soft, nontender, no hepatomegaly  Ext: Minimal, nonpitting, left leg below knee swelling Musculoskeletal:  No deformities, BUE and BLE strength normal and equal Skin: warm and dry  Neuro:  CNs 2-12 intact, no focal abnormalities noted Psych:  Normal affect    EKG:  The ECG that was done 01/08/2022, normal sinus rhythm otherwise unremarkable  Relevant CV Studies: None  Laboratory Data:  High Sensitivity Troponin:   Recent Labs  Lab 01/08/22 2218 01/09/22 0037  TROPONINIHS 77* 821*      Chemistry Recent Labs  Lab 01/08/22 2218  NA 138  K 3.9  CL 110  CO2 20*  GLUCOSE 110*  BUN 11  CREATININE 0.86  CALCIUM 8.5*  GFRNONAA >60  ANIONGAP 8    No results for input(s): "PROT", "ALBUMIN", "AST", "ALT", "ALKPHOS", "BILITOT" in the  last 168 hours. Lipids No results for input(s): "CHOL", "TRIG", "HDL", "LABVLDL", "LDLCALC", "CHOLHDL" in the last 168 hours. Hematology Recent Labs  Lab 01/08/22 2218  WBC 6.6  RBC 3.87  HGB 11.6*  HCT 36.4  MCV 94.1  MCH 30.0  MCHC 31.9  RDW 13.7  PLT 142*   Thyroid No results for input(s): "TSH", "FREET4" in the last 168 hours. BNPNo results for input(s): "BNP", "PROBNP" in the last 168 hours.  DDimer No results for input(s): "DDIMER" in the last 168 hours.   Radiology/Studies:  DG Chest 2 View  Result Date: 01/08/2022 CLINICAL DATA:  Chest pain  EXAM: CHEST - 2 VIEW COMPARISON:  09/26/2014 FINDINGS: The heart size and mediastinal contours are within normal limits. Both lungs are clear. The visualized skeletal structures are unremarkable. IMPRESSION: No active cardiopulmonary disease. Electronically Signed   By: Inez Catalina M.D.   On: 01/08/2022 22:45     Assessment and Plan:   41 year old Caucasian female presenting with NSTEMI.  TIMI risk is only 2.  Etiology of NSTEMI remains unclear.  Differential diagnosis includes plaque rupture, SCAD, stress-induced cardiomyopathy, myocardial injury in the setting of hypertensive emergency due to profound of diastolic hypertension. Patient is chest pain-free now.  Hemodynamically stable, somewhat hypertensive.  Currently on a heparin drip.  No indications for immediate invasive strategy. Plan. For now continue with heparin drip.  I will start patient on this of breath no nitrates and metoprolol tartrate. Follow-up lipid profile, A1c. Follow-up TTE. Start patient on Crestor 20 mg. Patient received aspirin 325 we will continue with 81 mg p.o. daily starting tomorrow Follow urine drug screen   Leg swelling. Follow-up venous Doppler.   Risk Assessment/Risk Scores:    TIMI Risk Score for Unstable Angina or Non-ST Elevation MI:   The patient's TIMI risk score is 2, which indicates a 8% risk of all cause mortality, new or recurrent  myocardial infarction or need for urgent revascularization in the next 14 days.       Severity of Illness: The appropriate patient status for this patient is OBSERVATION. Observation status is judged to be reasonable and necessary in order to provide the required intensity of service to ensure the patient's safety. The patient's presenting symptoms, physical exam findings, and initial radiographic and laboratory data in the context of their medical condition is felt to place them at decreased risk for further clinical deterioration. Furthermore, it is anticipated that the patient will be medically stable for discharge from the hospital within 2 midnights of admission.    For questions or updates, please contact [place] Please consult [email or web address] for contact info under     Signed, Warren Danes, MD  01/09/2022 2:04 AM

## 2022-01-09 NOTE — Progress Notes (Signed)
LLE venous duplex has been completed.   Results can be found under chart review under CV PROC. 01/09/2022 12:40 PM Feiga Nadel RVT, RDMS

## 2022-01-09 NOTE — Progress Notes (Signed)
Pt c/o chest pain 7/10. Pt reports feeling hot and flushed as well. Pt BP 156/77. Admin Nitro SL 0.4mg  x1. Pt expresses relief of CP  Brooke Pace, RN

## 2022-01-09 NOTE — Progress Notes (Signed)
Called by RN regarding hypertension and chest pain.  Patient is having ongoing chest pain, nitro drip is now at 7, blood pressure 172/108.  Troponins 77 >> 821 >> 2558  Added morphine, encouraged aggressive titration of nitro drip, recheck ECG, May need to go to the lab.  Theodore Demark, PA-C 01/09/2022 2:19 PM

## 2022-01-09 NOTE — Progress Notes (Signed)
Progress Note  Patient Name: [name] Date of Encounter: 01/09/2022  [facility] Cardiologist: New  Subjective   No CP or dyspnea  Inpatient Medications    Scheduled Meds:  [START ON 01/10/2022] aspirin  81 mg Oral Daily   isosorbide mononitrate  20 mg Oral BID   metoprolol tartrate  25 mg Oral BID   rosuvastatin  20 mg Oral Daily   Continuous Infusions:  heparin 1,350 Units/hr (01/09/22 0825)   nitroGLYCERIN 20 mcg/min (01/09/22 0501)   PRN Meds: acetaminophen, nitroGLYCERIN, ondansetron (ZOFRAN) IV   Vital Signs    Vitals:   01/09/22 0432 01/09/22 0508 01/09/22 0547 01/09/22 0812  BP: (!) 169/98 (!) 158/94 137/77 (!) 148/79  Pulse: (!) 55 (!) 55 (!) 56 (!) 53  Resp: (!) 22 20 19 19   Temp: 98.5 F (36.9 C)   98.5 F (36.9 C)  TempSrc: Oral   Oral  SpO2: 100% 100% 99% 96%  Weight:      Height:        Intake/Output Summary (Last 24 hours) at 01/09/2022 0846 Last data filed at 01/09/2022 0351 Gross per 24 hour  Intake 76.59 ml  Output 600 ml  Net -523.41 ml      01/09/2022    3:32 AM 01/08/2022    9:41 PM 09/23/2020    9:56 AM  Last 3 Weights  Weight (lbs) 282 lb 6.6 oz 266 lb 15.6 oz 267 lb  Weight (kg) 128.1 kg 121.1 kg 121.11 kg      Telemetry    Sinus - Personally Reviewed   Physical Exam   GEN: No acute distress.   Neck: No JVD Cardiac: RRR, no murmurs, rubs, or gallops.  Respiratory: Clear to auscultation bilaterally. GI: Soft, nontender, non-distended  MS: No edema Neuro:  Nonfocal  Psych: Normal affect   Labs    High Sensitivity Troponin:   Recent Labs  Lab 01/08/22 2218 01/09/22 0037 01/09/22 0341  TROPONINIHS 77* 821* 2,558*     Chemistry Recent Labs  Lab 01/08/22 2218 01/09/22 0341  NA 138 136  K 3.9 4.0  CL 110 110  CO2 20* 20*  GLUCOSE 110* 100*  BUN 11 11  CREATININE 0.86 0.97  CALCIUM 8.5* 8.9  MG  --  1.6*  PROT  --  6.5  ALBUMIN  --  3.4*  AST  --  25  ALT  --  12  ALKPHOS  --  65  BILITOT  --   0.6  GFRNONAA >60 >60  ANIONGAP 8 6    Lipids  Recent Labs  Lab 01/09/22 0341  CHOL 142  TRIG 16  HDL 54  LDLCALC 85  CHOLHDL 2.6    Hematology Recent Labs  Lab 01/08/22 2218 01/09/22 0341  WBC 6.6 8.5  RBC 3.87 3.71*  HGB 11.6* 11.0*  HCT 36.4 33.6*  MCV 94.1 90.6  MCH 30.0 29.6  MCHC 31.9 32.7  RDW 13.7 13.5  PLT 142* 136*     Radiology    DG Chest 2 View  Result Date: 01/08/2022 CLINICAL DATA:  Chest pain EXAM: CHEST - 2 VIEW COMPARISON:  09/26/2014 FINDINGS: The heart size and mediastinal contours are within normal limits. Both lungs are clear. The visualized skeletal structures are unremarkable. IMPRESSION: No active cardiopulmonary disease. Electronically Signed   By: 09/28/2014 M.D.   On: 01/08/2022 22:45     Patient Profile     41 y.o. female with no significant past medical history admitted with  non-ST elevation myocardial infarction.  Assessment & Plan    1 non-ST elevation myocardial infarction-patient admitted with chest pain that is concerning.  Enzymes are abnormal consistent with non-ST elevation microinfarction.  She is presently pain-free.  She will require cardiac catheterization.  The risk and benefits including myocardial infarction, CVA and death discussed and she agrees to proceed.  Continue aspirin, heparin, metoprolol, IV nitroglycerin and statin.  If symptoms recur may need to proceed with catheterization on a more urgent basis.  Schedule echocardiogram to assess LV function and rule out wall motion abnormality.  2 hypertension-patient's blood pressure is elevated and she likely has newly diagnosed hypertension.  We will add losartan 50 mg daily and follow.  3 tobacco abuse-patient counseled on discontinuing.  For questions or updates, please contact [facility] Please consult [email or web address] for contact info under        Signed, Olga Millers, MD  01/09/2022, 8:46 AM

## 2022-01-09 NOTE — Progress Notes (Addendum)
ANTICOAGULATION CONSULT NOTE   Pharmacy Consult for heparin Indication: chest pain/ACS  No Known Allergies  Patient Measurements: Height: 5\' 8"  (172.7 cm) Weight: 128.1 kg (282 lb 6.6 oz) IBW/kg (Calculated) : 63.9 Heparin Dosing Weight: 94.3kg  Vital Signs: Temp: 98.5 F (36.9 C) (08/05 0432) Temp Source: Oral (08/05 0432) BP: 137/77 (08/05 0547) Pulse Rate: 56 (08/05 0547)  Labs: Recent Labs    01/08/22 2218 01/09/22 0037 01/09/22 0341 01/09/22 0557  HGB 11.6*  --  11.0*  --   HCT 36.4  --  33.6*  --   PLT 142*  --  136*  --   HEPARINUNFRC  --   --   --  0.39  CREATININE 0.86  --  0.97  --   TROPONINIHS 77* 821* 2,558*  --      Estimated Creatinine Clearance: 109 mL/min (by C-G formula based on SCr of 0.97 mg/dL).   Medical History: Past Medical History:  Diagnosis Date   Anemia    Low back pain     Assessment: 41yo female with NSTEMI. No anticoagulation PTA. Pharmacy consulted to dose IV heparin. Hgb 11.0 and PLTs 136K stable. Heparin level is therapeutic at 0.39. No issues with infusion noted and no bleeding per RN.   Goal of Therapy:  Heparin level 0.3-0.7 units/ml Monitor platelets by anticoagulation protocol: Yes   Plan:  Increase heparin slightly to 1350 units/hr Obtain 6 hr heparin level  Daily CBC and heparin level while on heparin Follow up duration of heparin   42yo, PharmD PGY1 Pharmacy Resident   01/09/2022  7:16 AM

## 2022-01-09 NOTE — Progress Notes (Signed)
ANTICOAGULATION CONSULT NOTE   Pharmacy Consult for heparin Indication: chest pain/ACS  No Known Allergies  Patient Measurements: Height: 5\' 8"  (172.7 cm) Weight: 128.1 kg (282 lb 6.6 oz) IBW/kg (Calculated) : 63.9 Heparin Dosing Weight: 94.3kg  Vital Signs: Temp: 98 F (36.7 C) (08/05 1441) Temp Source: Oral (08/05 1441) BP: 164/76 (08/05 1441) Pulse Rate: 55 (08/05 1441)  Labs: Recent Labs    01/08/22 2218 01/09/22 0037 01/09/22 0341 01/09/22 0557 01/09/22 1453  HGB 11.6*  --  11.0*  --   --   HCT 36.4  --  33.6*  --   --   PLT 142*  --  136*  --   --   HEPARINUNFRC  --   --   --  0.39 0.31  CREATININE 0.86  --  0.97  --   --   TROPONINIHS 77* 821* 2,558*  --   --     Estimated Creatinine Clearance: 109 mL/min (by C-G formula based on SCr of 0.97 mg/dL).   Medical History: Past Medical History:  Diagnosis Date   Anemia    Low back pain     Assessment: 41yo female with NSTEMI and on going chest pain. No anticoagulation PTA. Pharmacy consulted to dose IV heparin. Heart cath is pending.  Heparin level 0.31 is at low end of therapeutic on 1350 units/hr. Level down trended despite slight rate increase.  No issues with infusion or bleeding per RN.    Goal of Therapy:  Heparin level 0.3-0.7 units/ml Monitor platelets by anticoagulation protocol: Yes   Plan:  Increase heparin to 1450 units/hr Monitor daily heparin level, CBC Monitor for signs/symptoms of bleeding    42yo, PharmD, BCPS, BCCP Clinical Pharmacist  Please check AMION for all [facility] Pharmacy phone numbers After 10:00 PM, call Main Pharmacy [phone]

## 2022-01-09 NOTE — Progress Notes (Signed)
Around 0430, less than an hour after receiving a nitro tablet, the patient suddenly started complaining of pressure like chest pain that was at a level 8.  The nurse got a STAT EKG, which said there was a T wave abnormality, possible anterior ischemia.  The on call cardiologist was informed. Dr. Hector Brunswick ordered a one time IV Fentanyl 25 mcg and to start a Nitro drip at 20 mcg/min.  After the Fentanyl was given and the drip started, the patient said her pain dropped down to a level 1.  Will continue to monitor.  Harriet Masson, RN

## 2022-01-09 NOTE — Progress Notes (Addendum)
1239 Pt BP 172/108. Increased nitro drip 5mcg/min for rate of 7.88ml/hr. MD aware.  1358 Pt BP 164/76. Encouraged by provider to increase nitro drip further, now running at 57ml/hr for rate 27mcg/min.  Brooke Pace, RN

## 2022-01-10 ENCOUNTER — Inpatient Hospital Stay (HOSPITAL_COMMUNITY): Payer: Self-pay

## 2022-01-10 DIAGNOSIS — I214 Non-ST elevation (NSTEMI) myocardial infarction: Secondary | ICD-10-CM

## 2022-01-10 LAB — ECHOCARDIOGRAM COMPLETE
AR max vel: 3.03 cm2
AV Area VTI: 3.27 cm2
AV Area mean vel: 2.9 cm2
AV Mean grad: 6 mmHg
AV Peak grad: 11.6 mmHg
Ao pk vel: 1.7 m/s
Area-P 1/2: 1.78 cm2
Height: 68 in
P 1/2 time: 414 msec
S' Lateral: 3.8 cm
Weight: 4518.55 oz

## 2022-01-10 LAB — CBC
HCT: 32.2 % — ABNORMAL LOW (ref 36.0–46.0)
Hemoglobin: 10.2 g/dL — ABNORMAL LOW (ref 12.0–15.0)
MCH: 29.1 pg (ref 26.0–34.0)
MCHC: 31.7 g/dL (ref 30.0–36.0)
MCV: 92 fL (ref 80.0–100.0)
Platelets: 126 10*3/uL — ABNORMAL LOW (ref 150–400)
RBC: 3.5 MIL/uL — ABNORMAL LOW (ref 3.87–5.11)
RDW: 13.4 % (ref 11.5–15.5)
WBC: 7.3 10*3/uL (ref 4.0–10.5)
nRBC: 0 % (ref 0.0–0.2)

## 2022-01-10 LAB — BASIC METABOLIC PANEL
Anion gap: 7 (ref 5–15)
BUN: 6 mg/dL (ref 6–20)
CO2: 20 mmol/L — ABNORMAL LOW (ref 22–32)
Calcium: 8.6 mg/dL — ABNORMAL LOW (ref 8.9–10.3)
Chloride: 106 mmol/L (ref 98–111)
Creatinine, Ser: 0.91 mg/dL (ref 0.44–1.00)
GFR, Estimated: >60 mL/min (ref >60)
Glucose, Bld: 88 mg/dL (ref 70–99)
Potassium: 3.7 mmol/L (ref 3.5–5.1)
Sodium: 133 mmol/L — ABNORMAL LOW (ref 135–145)

## 2022-01-10 LAB — HEPARIN LEVEL (UNFRACTIONATED): Heparin Unfractionated: 0.41 IU/mL (ref 0.30–0.70)

## 2022-01-10 LAB — LIPOPROTEIN A (LPA): Lipoprotein (a): 207 nmol/L — ABNORMAL HIGH (ref <75.0)

## 2022-01-10 MED ORDER — METOPROLOL TARTRATE 12.5 MG HALF TABLET
12.5000 mg | ORAL_TABLET | Freq: Two times a day (BID) | ORAL | Status: DC
  Administered 2022-01-10 – 2022-01-11 (×3): 12.5 mg via ORAL
  Filled 2022-01-10 (×3): qty 1

## 2022-01-10 NOTE — H&P (View-Only) (Signed)
Progress Note  Patient Name: [name] Date of Encounter: 01/10/2022  [facility] Cardiologist: New  Subjective   Pt denies CP or dyspnea this AM.  She states she had mild chest pain yesterday not like the pain she was admitted with.  Inpatient Medications    Scheduled Meds:  aspirin  81 mg Oral Daily   [START ON 01/11/2022] aspirin  81 mg Oral Pre-Cath   losartan  50 mg Oral Daily   metoprolol tartrate  25 mg Oral BID   rosuvastatin  40 mg Oral QHS   sodium chloride flush  3 mL Intravenous Q12H   Continuous Infusions:  sodium chloride     [START ON 01/11/2022] sodium chloride     Followed by   Melene Muller ON 01/11/2022] sodium chloride     heparin 1,450 Units/hr (01/09/22 1549)   nitroGLYCERIN 30 mcg/min (01/09/22 1358)   PRN Meds: sodium chloride, acetaminophen, morphine injection, nitroGLYCERIN, ondansetron (ZOFRAN) IV, sodium chloride flush   Vital Signs    Vitals:   01/09/22 1700 01/09/22 2005 01/10/22 0018 01/10/22 0309  BP: (!) 154/90 (!) 167/91 (!) 150/93 131/79  Pulse:  (!) 56 (!) 50 64  Resp: 20 17 15 16   Temp:  98.4 F (36.9 C) 98.5 F (36.9 C) 98.6 F (37 C)  TempSrc:  Oral Oral Oral  SpO2:  95% 96% 97%  Weight:      Height:        Intake/Output Summary (Last 24 hours) at 01/10/2022 03/12/2022 Last data filed at 01/10/2022 0320 Gross per 24 hour  Intake 1209.07 ml  Output --  Net 1209.07 ml       01/09/2022    3:32 AM 01/08/2022    9:41 PM 09/23/2020    9:56 AM  Last 3 Weights  Weight (lbs) 282 lb 6.6 oz 266 lb 15.6 oz 267 lb  Weight (kg) 128.1 kg 121.1 kg 121.11 kg      Telemetry    Sinus - Personally Reviewed   Physical Exam   GEN: NAD Neck: Supple Cardiac: RRR Respiratory: CTA GI: Soft, NT/ND MS: No edema Neuro:  Grossly intact Psych: Normal affect   Labs    High Sensitivity Troponin:   Recent Labs  Lab 01/08/22 2218 01/09/22 0037 01/09/22 0341  TROPONINIHS 77* 821* 2,558*      Chemistry Recent Labs  Lab 01/08/22 2218  01/09/22 0341 01/10/22 0243  NA 138 136 133*  K 3.9 4.0 3.7  CL 110 110 106  CO2 20* 20* 20*  GLUCOSE 110* 100* 88  BUN 11 11 6   CREATININE 0.86 0.97 0.91  CALCIUM 8.5* 8.9 8.6*  MG  --  1.6*  --   PROT  --  6.5  --   ALBUMIN  --  3.4*  --   AST  --  25  --   ALT  --  12  --   ALKPHOS  --  65  --   BILITOT  --  0.6  --   GFRNONAA >60 >60 >60  ANIONGAP 8 6 7      Lipids  Recent Labs  Lab 01/09/22 0341  CHOL 142  TRIG 16  HDL 54  LDLCALC 85  CHOLHDL 2.6     Hematology Recent Labs  Lab 01/08/22 2218 01/09/22 0341 01/10/22 0243  WBC 6.6 8.5 7.3  RBC 3.87 3.71* 3.50*  HGB 11.6* 11.0* 10.2*  HCT 36.4 33.6* 32.2*  MCV 94.1 90.6 92.0  MCH 30.0 29.6 29.1  MCHC  31.9 32.7 31.7  RDW 13.7 13.5 13.4  PLT 142* 136* 126*      Radiology    VAS [place] LOWER EXTREMITY VENOUS (DVT)  Result Date: 01/09/2022  Lower Venous DVT Study Patient Name:  FINNLEIGH MARCHETTI  Date of Exam:   01/09/2022 Medical Rec #: [id number]         Accession #:    [id number] Date of Birth: 11-18-1980          Patient Gender: F Patient Age:   2 years Exam Location:  [facility] Procedure:      VAS [place] LOWER EXTREMITY VENOUS (DVT) Referring Phys: Lenon Oms --------------------------------------------------------------------------------  Indications: Swelling.  Comparison Study: No previous exams Performing Technologist: Jody Hill RVT, RDMS  Examination Guidelines: A complete evaluation includes B-mode imaging, spectral Doppler, color Doppler, and power Doppler as needed of all accessible portions of each vessel. Bilateral testing is considered an integral part of a complete examination. Limited examinations for reoccurring indications may be performed as noted. The reflux portion of the exam is performed with the patient in reverse Trendelenburg.  +-----+---------------+---------+-----------+----------+--------------+ RIGHTCompressibilityPhasicitySpontaneityPropertiesThrombus Aging  +-----+---------------+---------+-----------+----------+--------------+ CFV  Full           Yes      Yes                                 +-----+---------------+---------+-----------+----------+--------------+   +---------+---------------+---------+-----------+----------+--------------+ LEFT     CompressibilityPhasicitySpontaneityPropertiesThrombus Aging +---------+---------------+---------+-----------+----------+--------------+ CFV      Full           Yes      Yes                                 +---------+---------------+---------+-----------+----------+--------------+ SFJ      Full                                                        +---------+---------------+---------+-----------+----------+--------------+ FV Prox  Full           Yes      Yes                                 +---------+---------------+---------+-----------+----------+--------------+ FV Mid   Full           Yes      Yes                                 +---------+---------------+---------+-----------+----------+--------------+ FV DistalFull           Yes      Yes                                 +---------+---------------+---------+-----------+----------+--------------+ PFV      Full                                                        +---------+---------------+---------+-----------+----------+--------------+  POP      Full           Yes      Yes                                 +---------+---------------+---------+-----------+----------+--------------+ PTV      Full                                                        +---------+---------------+---------+-----------+----------+--------------+ PERO     Full                                                        +---------+---------------+---------+-----------+----------+--------------+     Summary: RIGHT: - No evidence of common femoral vein obstruction. - Ultrasound characteristics of enlarged lymph nodes are noted in  the groin.  LEFT: - There is no evidence of deep vein thrombosis in the lower extremity.  - No cystic structure found in the popliteal fossa. - Ultrasound characteristics of enlarged lymph nodes noted in the groin.  *See table(s) above for measurements and observations. Electronically signed by Christopher Dickson MD on 01/09/2022 at 4:22:19 PM.    Final    DG Chest 2 View  Result Date: 01/08/2022 CLINICAL DATA:  Chest pain EXAM: CHEST - 2 VIEW COMPARISON:  09/26/2014 FINDINGS: The heart size and mediastinal contours are within normal limits. Both lungs are clear. The visualized skeletal structures are unremarkable. IMPRESSION: No active cardiopulmonary disease. Electronically Signed   By: Mark  Lukens M.D.   On: 01/08/2022 22:45     Patient Profile     41 y.o. female with no significant past medical history admitted with non-ST elevation myocardial infarction.  Assessment & Plan    1 non-ST elevation myocardial infarction-patient ruled in for non-ST elevation myocardial infarction.  She will require cardiac catheterization.  The risk and benefits including myocardial infarction, CVA and death discussed and she agrees to proceed.  Plan is for catheterization tomorrow or sooner if she becomes unstable.  Continue aspirin, heparin, metoprolol, IV nitroglycerin and statin.  Await echocardiogram to assess LV function.  2 hypertension-patient's blood pressure was elevated yesterday.  Losartan has been initiated with some improvement.  We will continue present dose as well as metoprolol and IV nitroglycerin.  Follow blood pressure and advance regimen as needed.  3 tobacco abuse-patient previously counseled on discontinuing.  For questions or updates, please contact [facility] Please consult [email or web address] for contact info under        Signed, Cherrill Scrima, MD  01/10/2022, 8:22 AM    

## 2022-01-10 NOTE — Progress Notes (Signed)
ANTICOAGULATION CONSULT NOTE   Pharmacy Consult for heparin Indication: chest pain/ACS  No Known Allergies  Patient Measurements: Height: 5\' 8"  (172.7 cm) Weight: 128.1 kg (282 lb 6.6 oz) IBW/kg (Calculated) : 63.9 Heparin Dosing Weight: 94.3kg  Vital Signs: Temp: 98.7 F (37.1 C) (08/06 0900) Temp Source: Oral (08/06 0900) BP: 154/79 (08/06 0900) Pulse Rate: 52 (08/06 0900)  Labs: Recent Labs    01/08/22 2218 01/09/22 0037 01/09/22 0341 01/09/22 0557 01/09/22 1453 01/10/22 0243  HGB 11.6*  --  11.0*  --   --  10.2*  HCT 36.4  --  33.6*  --   --  32.2*  PLT 142*  --  136*  --   --  126*  HEPARINUNFRC  --   --   --  0.39 0.31 0.41  CREATININE 0.86  --  0.97  --   --  0.91  TROPONINIHS 77* 821* 2,558*  --   --   --      Estimated Creatinine Clearance: 116.2 mL/min (by C-G formula based on SCr of 0.91 mg/dL).   Medical History: Past Medical History:  Diagnosis Date   Anemia    Low back pain     Assessment: 41yo female with NSTEMI and on going chest pain. No anticoagulation PTA. Pharmacy consulted to dose IV heparin. Heart cath is pending.  Heparin level remains therapeutic at 0.41 after increasing rate slightly. CBC stable. No issues with infusion or bleeding per RN.  Goal of Therapy:  Heparin level 0.3-0.7 units/ml Monitor platelets by anticoagulation protocol: Yes   Plan:  Continue IV heparin at 1450 units/hr Monitor daily heparin level, CBC Monitor for signs/symptoms of bleeding  Follow up cath plans   42yo, PharmD PGY1 Pharmacy Resident   01/10/2022  10:00 AM

## 2022-01-10 NOTE — Progress Notes (Signed)
  Echocardiogram 2D Echocardiogram has been performed.  Gerda Diss 01/10/2022, 3:29 PM

## 2022-01-10 NOTE — Progress Notes (Signed)
Progress Note  Patient Name: [name] Date of Encounter: 01/10/2022  [facility] Cardiologist: New  Subjective   Pt denies CP or dyspnea this AM.  She states she had mild chest pain yesterday not like the pain she was admitted with.  Inpatient Medications    Scheduled Meds:  aspirin  81 mg Oral Daily   [START ON 01/11/2022] aspirin  81 mg Oral Pre-Cath   losartan  50 mg Oral Daily   metoprolol tartrate  25 mg Oral BID   rosuvastatin  40 mg Oral QHS   sodium chloride flush  3 mL Intravenous Q12H   Continuous Infusions:  sodium chloride     [START ON 01/11/2022] sodium chloride     Followed by   Melene Muller ON 01/11/2022] sodium chloride     heparin 1,450 Units/hr (01/09/22 1549)   nitroGLYCERIN 30 mcg/min (01/09/22 1358)   PRN Meds: sodium chloride, acetaminophen, morphine injection, nitroGLYCERIN, ondansetron (ZOFRAN) IV, sodium chloride flush   Vital Signs    Vitals:   01/09/22 1700 01/09/22 2005 01/10/22 0018 01/10/22 0309  BP: (!) 154/90 (!) 167/91 (!) 150/93 131/79  Pulse:  (!) 56 (!) 50 64  Resp: 20 17 15 16   Temp:  98.4 F (36.9 C) 98.5 F (36.9 C) 98.6 F (37 C)  TempSrc:  Oral Oral Oral  SpO2:  95% 96% 97%  Weight:      Height:        Intake/Output Summary (Last 24 hours) at 01/10/2022 03/12/2022 Last data filed at 01/10/2022 0320 Gross per 24 hour  Intake 1209.07 ml  Output --  Net 1209.07 ml       01/09/2022    3:32 AM 01/08/2022    9:41 PM 09/23/2020    9:56 AM  Last 3 Weights  Weight (lbs) 282 lb 6.6 oz 266 lb 15.6 oz 267 lb  Weight (kg) 128.1 kg 121.1 kg 121.11 kg      Telemetry    Sinus - Personally Reviewed   Physical Exam   GEN: NAD Neck: Supple Cardiac: RRR Respiratory: CTA GI: Soft, NT/ND MS: No edema Neuro:  Grossly intact Psych: Normal affect   Labs    High Sensitivity Troponin:   Recent Labs  Lab 01/08/22 2218 01/09/22 0037 01/09/22 0341  TROPONINIHS 77* 821* 2,558*      Chemistry Recent Labs  Lab 01/08/22 2218  01/09/22 0341 01/10/22 0243  NA 138 136 133*  K 3.9 4.0 3.7  CL 110 110 106  CO2 20* 20* 20*  GLUCOSE 110* 100* 88  BUN 11 11 6   CREATININE 0.86 0.97 0.91  CALCIUM 8.5* 8.9 8.6*  MG  --  1.6*  --   PROT  --  6.5  --   ALBUMIN  --  3.4*  --   AST  --  25  --   ALT  --  12  --   ALKPHOS  --  65  --   BILITOT  --  0.6  --   GFRNONAA >60 >60 >60  ANIONGAP 8 6 7      Lipids  Recent Labs  Lab 01/09/22 0341  CHOL 142  TRIG 16  HDL 54  LDLCALC 85  CHOLHDL 2.6     Hematology Recent Labs  Lab 01/08/22 2218 01/09/22 0341 01/10/22 0243  WBC 6.6 8.5 7.3  RBC 3.87 3.71* 3.50*  HGB 11.6* 11.0* 10.2*  HCT 36.4 33.6* 32.2*  MCV 94.1 90.6 92.0  MCH 30.0 29.6 29.1  MCHC  31.9 32.7 31.7  RDW 13.7 13.5 13.4  PLT 142* 136* 126*      Radiology    VAS [place] LOWER EXTREMITY VENOUS (DVT)  Result Date: 01/09/2022  Lower Venous DVT Study Patient Name:  [name]  Date of Exam:   01/09/2022 Medical Rec #: [id number]         Accession #:    [id number] Date of Birth: 11-18-1980          Patient Gender: F Patient Age:   2 years Exam Location:  [facility] Procedure:      VAS [place] LOWER EXTREMITY VENOUS (DVT) Referring Phys: Lenon Oms --------------------------------------------------------------------------------  Indications: Swelling.  Comparison Study: No previous exams Performing Technologist: Jody Hill RVT, RDMS  Examination Guidelines: A complete evaluation includes B-mode imaging, spectral Doppler, color Doppler, and power Doppler as needed of all accessible portions of each vessel. Bilateral testing is considered an integral part of a complete examination. Limited examinations for reoccurring indications may be performed as noted. The reflux portion of the exam is performed with the patient in reverse Trendelenburg.  +-----+---------------+---------+-----------+----------+--------------+ RIGHTCompressibilityPhasicitySpontaneityPropertiesThrombus Aging  +-----+---------------+---------+-----------+----------+--------------+ CFV  Full           Yes      Yes                                 +-----+---------------+---------+-----------+----------+--------------+   +---------+---------------+---------+-----------+----------+--------------+ LEFT     CompressibilityPhasicitySpontaneityPropertiesThrombus Aging +---------+---------------+---------+-----------+----------+--------------+ CFV      Full           Yes      Yes                                 +---------+---------------+---------+-----------+----------+--------------+ SFJ      Full                                                        +---------+---------------+---------+-----------+----------+--------------+ FV Prox  Full           Yes      Yes                                 +---------+---------------+---------+-----------+----------+--------------+ FV Mid   Full           Yes      Yes                                 +---------+---------------+---------+-----------+----------+--------------+ FV DistalFull           Yes      Yes                                 +---------+---------------+---------+-----------+----------+--------------+ PFV      Full                                                        +---------+---------------+---------+-----------+----------+--------------+  POP      Full           Yes      Yes                                 +---------+---------------+---------+-----------+----------+--------------+ PTV      Full                                                        +---------+---------------+---------+-----------+----------+--------------+ PERO     Full                                                        +---------+---------------+---------+-----------+----------+--------------+     Summary: RIGHT: - No evidence of common femoral vein obstruction. - Ultrasound characteristics of enlarged lymph nodes are noted in  the groin.  LEFT: - There is no evidence of deep vein thrombosis in the lower extremity.  - No cystic structure found in the popliteal fossa. - Ultrasound characteristics of enlarged lymph nodes noted in the groin.  *See table(s) above for measurements and observations. Electronically signed by Deitra Mayo MD on 01/09/2022 at 4:22:19 PM.    Final    DG Chest 2 View  Result Date: 01/08/2022 CLINICAL DATA:  Chest pain EXAM: CHEST - 2 VIEW COMPARISON:  09/26/2014 FINDINGS: The heart size and mediastinal contours are within normal limits. Both lungs are clear. The visualized skeletal structures are unremarkable. IMPRESSION: No active cardiopulmonary disease. Electronically Signed   By: Inez Catalina M.D.   On: 01/08/2022 22:45     Patient Profile     41 y.o. female with no significant past medical history admitted with non-ST elevation myocardial infarction.  Assessment & Plan    1 non-ST elevation myocardial infarction-patient ruled in for non-ST elevation myocardial infarction.  She will require cardiac catheterization.  The risk and benefits including myocardial infarction, CVA and death discussed and she agrees to proceed.  Plan is for catheterization tomorrow or sooner if she becomes unstable.  Continue aspirin, heparin, metoprolol, IV nitroglycerin and statin.  Await echocardiogram to assess LV function.  2 hypertension-patient's blood pressure was elevated yesterday.  Losartan has been initiated with some improvement.  We will continue present dose as well as metoprolol and IV nitroglycerin.  Follow blood pressure and advance regimen as needed.  3 tobacco abuse-patient previously counseled on discontinuing.  For questions or updates, please contact [facility] Please consult [email or web address] for contact info under        Signed, Kirk Ruths, MD  01/10/2022, 8:22 AM

## 2022-01-11 ENCOUNTER — Encounter (HOSPITAL_COMMUNITY)
Admission: EM | Disposition: A | Discharge status: Home / Self Care | Payer: Self-pay | Source: Home / Self Care | Attending: Cardiovascular Disease

## 2022-01-11 ENCOUNTER — Encounter (HOSPITAL_COMMUNITY): Payer: Self-pay | Admitting: Cardiology

## 2022-01-11 DIAGNOSIS — I2510 Atherosclerotic heart disease of native coronary artery without angina pectoris: Secondary | ICD-10-CM

## 2022-01-11 DIAGNOSIS — E785 Hyperlipidemia, unspecified: Secondary | ICD-10-CM

## 2022-01-11 DIAGNOSIS — I10 Essential (primary) hypertension: Secondary | ICD-10-CM

## 2022-01-11 DIAGNOSIS — R599 Enlarged lymph nodes, unspecified: Secondary | ICD-10-CM

## 2022-01-11 DIAGNOSIS — Z720 Tobacco use: Secondary | ICD-10-CM

## 2022-01-11 HISTORY — DX: Hyperlipidemia, unspecified: E78.5

## 2022-01-11 HISTORY — PX: LEFT HEART CATH AND CORONARY ANGIOGRAPHY: CATH118249

## 2022-01-11 HISTORY — DX: Enlarged lymph nodes, unspecified: R59.9

## 2022-01-11 LAB — CBC
HCT: 32.3 % — ABNORMAL LOW (ref 36.0–46.0)
Hemoglobin: 10.6 g/dL — ABNORMAL LOW (ref 12.0–15.0)
MCH: 30.2 pg (ref 26.0–34.0)
MCHC: 32.8 g/dL (ref 30.0–36.0)
MCV: 92 fL (ref 80.0–100.0)
Platelets: 123 10*3/uL — ABNORMAL LOW (ref 150–400)
RBC: 3.51 MIL/uL — ABNORMAL LOW (ref 3.87–5.11)
RDW: 13.2 % (ref 11.5–15.5)
WBC: 7.2 10*3/uL (ref 4.0–10.5)
nRBC: 0 % (ref 0.0–0.2)

## 2022-01-11 LAB — HEPARIN LEVEL (UNFRACTIONATED): Heparin Unfractionated: 0.28 IU/mL — ABNORMAL LOW (ref 0.30–0.70)

## 2022-01-11 SURGERY — LEFT HEART CATH AND CORONARY ANGIOGRAPHY
Anesthesia: LOCAL

## 2022-01-11 MED ORDER — AMLODIPINE BESYLATE 5 MG PO TABS
5.0000 mg | ORAL_TABLET | Freq: Every day | ORAL | 2 refills | Status: DC
Start: 2022-01-12 — End: 2024-04-02

## 2022-01-11 MED ORDER — HYDRALAZINE HCL 20 MG/ML IJ SOLN: 10.0000 mg | INTRAMUSCULAR | Status: AC | PRN

## 2022-01-11 MED ORDER — LOSARTAN POTASSIUM 50 MG PO TABS: 50.0000 mg | ORAL_TABLET | Freq: Every day | ORAL | 2 refills | Status: DC

## 2022-01-11 MED ORDER — FENTANYL CITRATE (PF) 100 MCG/2ML IJ SOLN
INTRAMUSCULAR | Status: AC
  Filled 2022-01-11: qty 2

## 2022-01-11 MED ORDER — ASPIRIN 81 MG PO CHEW: 81.0000 mg | CHEWABLE_TABLET | Freq: Every day | ORAL | Status: DC

## 2022-01-11 MED ORDER — SODIUM CHLORIDE 0.9 % WEIGHT BASED INFUSION
1.0000 mL/kg/h | INTRAVENOUS | Status: AC
  Administered 2022-01-11: 1 mL/kg/h via INTRAVENOUS

## 2022-01-11 MED ORDER — ROSUVASTATIN CALCIUM 40 MG PO TABS: 40.0000 mg | ORAL_TABLET | Freq: Every day | ORAL | 2 refills | Status: DC

## 2022-01-11 MED ORDER — FENTANYL CITRATE (PF) 100 MCG/2ML IJ SOLN
INTRAMUSCULAR | Status: DC | PRN
  Administered 2022-01-11: 25 ug via INTRAVENOUS

## 2022-01-11 MED ORDER — VERAPAMIL HCL 2.5 MG/ML IV SOLN
INTRAVENOUS | Status: DC | PRN
  Administered 2022-01-11: 10 mL via INTRA_ARTERIAL

## 2022-01-11 MED ORDER — SODIUM CHLORIDE 0.9% FLUSH: 3.0000 mL | Freq: Two times a day (BID) | INTRAVENOUS | Status: DC

## 2022-01-11 MED ORDER — MIDAZOLAM HCL 2 MG/2ML IJ SOLN
INTRAMUSCULAR | Status: AC
  Filled 2022-01-11: qty 2

## 2022-01-11 MED ORDER — ENOXAPARIN SODIUM 40 MG/0.4ML IJ SOSY: 40.0000 mg | PREFILLED_SYRINGE | INTRAMUSCULAR | Status: DC

## 2022-01-11 MED ORDER — SODIUM CHLORIDE 0.9 % IV SOLN: 250.0000 mL | INTRAVENOUS | Status: DC | PRN

## 2022-01-11 MED ORDER — AMLODIPINE BESYLATE 5 MG PO TABS
5.0000 mg | ORAL_TABLET | Freq: Every day | ORAL | Status: DC
  Administered 2022-01-11: 5 mg via ORAL
  Filled 2022-01-11: qty 1

## 2022-01-11 MED ORDER — SODIUM CHLORIDE 0.9% FLUSH: 3.0000 mL | INTRAVENOUS | Status: DC | PRN

## 2022-01-11 MED ORDER — NITROGLYCERIN 0.4 MG SL SUBL: 0.4000 mg | SUBLINGUAL_TABLET | SUBLINGUAL | 2 refills | Status: AC | PRN

## 2022-01-11 MED ORDER — METOPROLOL SUCCINATE ER 25 MG PO TB24: 25.0000 mg | ORAL_TABLET | Freq: Every day | ORAL | 11 refills | Status: DC

## 2022-01-11 MED ORDER — HYDRALAZINE HCL 20 MG/ML IJ SOLN
INTRAMUSCULAR | Status: AC
  Filled 2022-01-11: qty 1

## 2022-01-11 MED ORDER — HEPARIN SODIUM (PORCINE) 1000 UNIT/ML IJ SOLN
INTRAMUSCULAR | Status: DC | PRN
  Administered 2022-01-11: 6000 [IU] via INTRAVENOUS

## 2022-01-11 MED ORDER — VERAPAMIL HCL 2.5 MG/ML IV SOLN
INTRAVENOUS | Status: AC
  Filled 2022-01-11: qty 2

## 2022-01-11 MED ORDER — LIDOCAINE HCL (PF) 1 % IJ SOLN
INTRAMUSCULAR | Status: AC
  Filled 2022-01-11: qty 30

## 2022-01-11 MED ORDER — HEPARIN SODIUM (PORCINE) 1000 UNIT/ML IJ SOLN
INTRAMUSCULAR | Status: AC
  Filled 2022-01-11: qty 10

## 2022-01-11 MED ORDER — IOHEXOL 350 MG/ML SOLN
INTRAVENOUS | Status: DC | PRN
  Administered 2022-01-11: 55 mL

## 2022-01-11 MED ORDER — HEPARIN (PORCINE) IN NACL 1000-0.9 UT/500ML-% IV SOLN
INTRAVENOUS | Status: AC
  Filled 2022-01-11: qty 1000

## 2022-01-11 MED ORDER — MIDAZOLAM HCL 2 MG/2ML IJ SOLN
INTRAMUSCULAR | Status: DC | PRN
  Administered 2022-01-11: 2 mg via INTRAVENOUS

## 2022-01-11 MED ORDER — NICOTINE 14 MG/24HR TD PT24: 14.0000 mg | MEDICATED_PATCH | TRANSDERMAL | 0 refills | Status: AC

## 2022-01-11 MED ORDER — HEPARIN (PORCINE) IN NACL 1000-0.9 UT/500ML-% IV SOLN
INTRAVENOUS | Status: DC | PRN
  Administered 2022-01-11 (×2): 500 mL

## 2022-01-11 MED ORDER — HYDRALAZINE HCL 20 MG/ML IJ SOLN
INTRAMUSCULAR | Status: DC | PRN
  Administered 2022-01-11: 10 mg via INTRAVENOUS

## 2022-01-11 MED ORDER — LIDOCAINE HCL (PF) 1 % IJ SOLN
INTRAMUSCULAR | Status: DC | PRN
  Administered 2022-01-11: 2 mL

## 2022-01-11 SURGICAL SUPPLY — 10 items
BAND CMPR LRG ZPHR (HEMOSTASIS) ×1
BAND ZEPHYR COMPRESS 30 LONG (HEMOSTASIS) ×1 IMPLANT
CATH 5FR JL3.5 JR4 ANG PIG MP (CATHETERS) ×1 IMPLANT
GLIDESHEATH SLEND SS 6F .021 (SHEATH) ×1 IMPLANT
GUIDEWIRE INQWIRE 1.5J.035X260 (WIRE) IMPLANT
INQWIRE 1.5J .035X260CM (WIRE) ×2
KIT HEART LEFT (KITS) ×2 IMPLANT
PACK CARDIAC CATHETERIZATION (CUSTOM PROCEDURE TRAY) ×2 IMPLANT
TRANSDUCER W/STOPCOCK (MISCELLANEOUS) ×2 IMPLANT
TUBING CIL FLEX 10 FLL-RA (TUBING) ×2 IMPLANT

## 2022-01-11 NOTE — Discharge Summary (Signed)
Discharge Summary    Patient ID: [name] MRN: [id number]; DOB: 11/25/1980  Admit date: 01/08/2022 Discharge date: 01/11/2022  PCP:  Default, Provider, MD   [facility] Providers Cardiologist:  Dr. Jens Som  Discharge Diagnoses    Principal Problem:   NSTEMI (non-ST elevated myocardial infarction) [facility]) Active Problems:   CAD (coronary artery disease)   Hypertension   Dyslipidemia   Tobacco abuse   Enlarged lymph node    Diagnostic Studies/Procedures    Echocardiogram 01/10/2022: Impressions:  1. Left ventricular ejection fraction, by estimation, is 55 to 60%. The  left ventricle has normal function. The left ventricle has no regional  wall motion abnormalities. The left ventricular internal cavity size was  mildly dilated. There is moderate  concentric left ventricular hypertrophy. Left ventricular diastolic  parameters were normal.   2. Right ventricular systolic function is normal. The right ventricular  size is normal.   3. The mitral valve is normal in structure. Trivial mitral valve  regurgitation. No evidence of mitral stenosis.   4. The aortic valve is tricuspid. Aortic valve regurgitation is mild. No  aortic stenosis is present.   5. The inferior vena cava is dilated in size with >50% respiratory  variability, suggesting right atrial pressure of 8 mmHg.  _____________   Left Cardiac Catheterization 01/11/2022:   1st Diag lesion is 35% stenosed.   Prox LAD lesion is 20% stenosed.   LV end diastolic pressure is mildly elevated.   Mild nonobstructive CAD.  Mildly elevated LVEDP   Plan: medical therapy with optimal BP control.   Diagnostic Dominance: Left   History of Present Illness     GISSELA Mitchell is a 41 y.o. female with no significant past medical history other than tobacco use prior to admission who was admitted on 01/08/2022 with NSTEMI after presenting with chest pain.  Patient reported that she has been under a great deal of stress  recently. She had an episode of chest pain that she attributed to heartburn on the morning of presentation but this resolved quickly.  However, around 7 PM that evening, she experienced an episode of sharp midsternal chest pain with associated diaphoresis and near syncope.  In addition, she also had an episode of emesis with this.  She presented to the ED for further evaluation.  While in the ED, she had another episode of chest pain that resolved with sublingual nitroglycerin.   In the ED, patient was markedly hypertensive with BP as high as 199/87.  High-sensitivity troponin elevated at 77 >> 821 >> 2,558.  Chest x-ray showed no acute findings. WBC 6.6, Hgb 11.6, Plts 142. Na 138, K 3.9, Glucose 110, BUN 11, Cr 0.86. Venous blood gas pH 7.38, pCO2 34, pO2 76, Bicarb 20.1. She was started on IV Heparin and admitted for NSTEMI.  She has a 20-year smoking history and currently smokes 1/2 pack per day. UDS also positive for THC. She has no significant family history of cardiac disease.  Hospital Course     Consultants: None   NSTEMI CAD Patient presented with chest pain and ruled in for NSTEMI. High-sensitivity troponin peaked at 2,558. Echo showed LVEF of 55-60% with no regional wall motion abnormalities, moderate LVH, and mild AI. Patient underwent LHC on 01/11/2022 which showed only mild non-obstructive CAD with 25% stenosis of 1st Diag and 20% stenosis of proximal LAD. LVEDP was mildly elevated. Aggressive risk factor modification recommended. Unclear what cause significant troponin elevation. She tolerated the procedure well and has not  had any recurrent chest pain over the last couple of days. She was discharged on Aspirin 81mg  daily, Toprol-XL 25mg  daily, Amlodipine 5mg  daily, Losartan 50mg  daily, and Crestor 40mg  daily.  Hypertension - New Diagnosis BP markedly elevated on arrival with systolic BP in the 190s. She was started on Amlodipine, Losartan, and Toprol-XL as above. Will need repeat BMET  at follow-up visit.  Dyslipidemia Lipid panel this admission showed Total Cholesterol 142, Triglycerides 16, HDL 54, LDL 85. LDL <70 given CAD. Started on Crestor 40mg  daily. Will need repeat lipid panel and LFTs in 6-8 weeks.  Tobacco Abuse Patient has a 20 year smoking history and currently smokes 1/2 pack per day. Complete cessation was recommended. She seems motivated to quit. Nicotine patches prescribed at discharge.  Enlarged Lymph Nodes Patient was noted to have left lower extremity edema on admission. Lower extremity dopplers were negative for DVT but showed enlarged lymph nodes in bilateral groins. Will need to follow-up with PCP for this following discharge.  Patient seen and examined by Dr. today and determined to be stable for discharge. Outpatient follow-up arranged. Medications as below.  Did the patient have an acute coronary syndrome (MI, NSTEMI, STEMI, etc) this admission?:  Yes                               AHA/ACC Clinical Performance & Quality Measures: Aspirin prescribed? - Yes ADP Receptor Inhibitor (Plavix/Clopidogrel, Brilinta/Ticagrelor or Effient/Prasugrel) prescribed (includes medically managed patients)? - No - only minimal disease noted on cath Beta Blocker prescribed? - Yes High Intensity Statin (Lipitor 40-80mg  or Crestor 20-40mg ) prescribed? - Yes EF assessed during THIS hospitalization? - Yes For EF <40%, was ACEI/ARB prescribed? - Not Applicable (EF >/= 40%) For EF <40%, Aldosterone Antagonist (Spironolactone or Eplerenone) prescribed? - Not Applicable (EF >/= 40%) Cardiac Rehab Phase II ordered (including medically managed patients)? - No - only minimal disease noted on cath  (not necessary per MD)  _____________  Discharge Vitals Blood pressure (!) 197/97, pulse (!) 59, temperature 98.1 F (36.7 C), temperature source Oral, resp. rate 19, height 5\' 8"  (1.727 m), weight 128.1 kg, last menstrual period 12/11/2021, SpO2 99 %.  Filed Weights    01/08/22 2141 01/09/22 0332  Weight: 121.1 kg 128.1 kg    Labs & Radiologic Studies    CBC Recent Labs    01/09/22 0341 01/10/22 0243 01/11/22 0233  WBC 8.5 7.3 7.2  NEUTROABS 6.5  --   --   HGB 11.0* 10.2* 10.6*  HCT 33.6* 32.2* 32.3*  MCV 90.6 92.0 92.0  PLT 136* 126* 123*   Basic Metabolic Panel Recent Labs    0341 01/10/22 0243  NA 136 133*  K 4.0 3.7  CL 110 106  CO2 20* 20*  GLUCOSE 100* 88  BUN 11 6  CREATININE 0.97 0.91  CALCIUM 8.9 8.6*  MG 1.6*  --    Liver Function Tests Recent Labs    01/09/22 0341  AST 25  ALT 12  ALKPHOS 65  BILITOT 0.6  PROT 6.5  ALBUMIN 3.4*   No results for input(s): "LIPASE", "AMYLASE" in the last 72 hours. High Sensitivity Troponin:   Recent Labs  Lab 01/08/22 2218 01/09/22 0037 01/09/22 0341  TROPONINIHS 77* 821* 2,558*    BNP Invalid input(s): "POCBNP" D-Dimer No results for input(s): "DDIMER" in the last 72 hours. Hemoglobin A1C Recent Labs    01/09/22 0341  HGBA1C 4.7*  Fasting Lipid Panel Recent Labs    01/09/22 0341  CHOL 142  HDL 54  LDLCALC 85  TRIG 16  CHOLHDL 2.6   Thyroid Function Tests No results for input(s): "TSH", "T4TOTAL", "T3FREE", "THYROIDAB" in the last 72 hours.  Invalid input(s): "FREET3" _____________  CARDIAC CATHETERIZATION  Result Date: 01/11/2022   1st Diag lesion is 35% stenosed.   Prox LAD lesion is 20% stenosed.   LV end diastolic pressure is mildly elevated. Mild nonobstructive CAD. Mildly elevated LVEDP Plan: medical therapy with optimal BP control.   ECHOCARDIOGRAM COMPLETE  Result Date: 01/10/2022    ECHOCARDIOGRAM REPORT   Patient Name:   [name] Date of Exam: 01/10/2022 Medical Rec #:  [id number]        Height:       68.0 in Accession #:    [id number]       Weight:       282.4 lb Date of Birth:  03/06/1981         BSA:          2.367 m Patient Age:    41 years         BP:           149/51 mmHg Patient Gender: F                HR:           60 bpm.  Exam Location:  Inpatient Procedure: 2D Echo, Cardiac Doppler and Color Doppler Indications:    NSTEMI  History:        Patient has no prior history of Echocardiogram examinations.                 Risk Factors:Hypertension and Current Smoker.  Sonographer:    Ross LudwigArthur Guy RDCS (AE) Referring Phys: Lenon OmsALEXANDER IVANOV IMPRESSIONS  1. Left ventricular ejection fraction, by estimation, is 55 to 60%. The left ventricle has normal function. The left ventricle has no regional wall motion abnormalities. The left ventricular internal cavity size was mildly dilated. There is moderate concentric left ventricular hypertrophy. Left ventricular diastolic parameters were normal.  2. Right ventricular systolic function is normal. The right ventricular size is normal.  3. The mitral valve is normal in structure. Trivial mitral valve regurgitation. No evidence of mitral stenosis.  4. The aortic valve is tricuspid. Aortic valve regurgitation is mild. No aortic stenosis is present.  5. The inferior vena cava is dilated in size with >50% respiratory variability, suggesting right atrial pressure of 8 mmHg. FINDINGS  Left Ventricle: Left ventricular ejection fraction, by estimation, is 55 to 60%. The left ventricle has normal function. The left ventricle has no regional wall motion abnormalities. The left ventricular internal cavity size was mildly dilated. There is  moderate concentric left ventricular hypertrophy. Left ventricular diastolic parameters were normal. Right Ventricle: The right ventricular size is normal. Right ventricular systolic function is normal. Left Atrium: Left atrial size was normal in size. Right Atrium: Right atrial size was normal in size. Pericardium: There is no evidence of pericardial effusion. Mitral Valve: The mitral valve is normal in structure. Trivial mitral valve regurgitation. No evidence of mitral valve stenosis. Tricuspid Valve: The tricuspid valve is normal in structure. Tricuspid valve regurgitation is  trivial. No evidence of tricuspid stenosis. Aortic Valve: The aortic valve is tricuspid. Aortic valve regurgitation is mild. Aortic regurgitation PHT measures 414 msec. No aortic stenosis is present. Aortic valve mean gradient measures 6.0 mmHg. Aortic valve peak gradient measures  11.6 mmHg. Aortic valve area, by VTI measures 3.27 cm. Pulmonic Valve: The pulmonic valve was normal in structure. Pulmonic valve regurgitation is not visualized. No evidence of pulmonic stenosis. Aorta: The aortic root is normal in size and structure. Venous: The inferior vena cava is dilated in size with greater than 50% respiratory variability, suggesting right atrial pressure of 8 mmHg. IAS/Shunts: No atrial level shunt detected by color flow Doppler.  LEFT VENTRICLE PLAX 2D LVIDd:         5.40 cm   Diastology LVIDs:         3.80 cm   LV e' medial:    9.68 cm/s LV PW:         1.80 cm   LV E/e' medial:  8.7 LV IVS:        1.60 cm   LV e' lateral:   10.80 cm/s LVOT diam:     2.30 cm   LV E/e' lateral: 7.8 LV SV:         120 LV SV Index:   51 LVOT Area:     4.15 cm  RIGHT VENTRICLE             IVC RV S prime:     17.30 cm/s  IVC diam: 2.60 cm TAPSE (M-mode): 2.2 cm LEFT ATRIUM           Index        RIGHT ATRIUM           Index LA diam:      3.90 cm 1.65 cm/m   RA Area:     20.30 cm LA Vol (A4C): 68.9 ml 29.11 ml/m  RA Volume:   58.30 ml  24.64 ml/m  AORTIC VALVE AV Area (Vmax):    3.03 cm AV Area (Vmean):   2.90 cm AV Area (VTI):     3.27 cm AV Vmax:           170.00 cm/s AV Vmean:          110.000 cm/s AV VTI:            0.366 m AV Peak Grad:      11.6 mmHg AV Mean Grad:      6.0 mmHg LVOT Vmax:         124.00 cm/s LVOT Vmean:        76.700 cm/s LVOT VTI:          0.288 m LVOT/AV VTI ratio: 0.79 AI PHT:            414 msec  AORTA Ao Root diam: 3.10 cm Ao Asc diam:  3.30 cm MITRAL VALVE MV Area (PHT): 1.78 cm    SHUNTS MV Decel Time: 426 msec    Systemic VTI:  0.29 m MV E velocity: 84.10 cm/s  Systemic Diam: 2.30 cm MV A  velocity: 51.60 cm/s MV E/A ratio:  1.63 Olga Millers MD Electronically signed by Olga Millers MD Signature Date/Time: 01/10/2022/3:32:39 PM    Final    VAS [place] LOWER EXTREMITY VENOUS (DVT)  Result Date: 01/09/2022  Lower Venous DVT Study Patient Name:  MALEKA CONTINO  Date of Exam:   01/09/2022 Medical Rec #: [id number]         Accession #:    [id number] Date of Birth: Oct 23, 1980          Patient Gender: F Patient Age:   38 years Exam Location:  [facility] Procedure:      VAS [place] LOWER  EXTREMITY VENOUS (DVT) Referring Phys: Lenon Oms --------------------------------------------------------------------------------  Indications: Swelling.  Comparison Study: No previous exams Performing Technologist: Jody Hill RVT, RDMS  Examination Guidelines: A complete evaluation includes B-mode imaging, spectral Doppler, color Doppler, and power Doppler as needed of all accessible portions of each vessel. Bilateral testing is considered an integral part of a complete examination. Limited examinations for reoccurring indications may be performed as noted. The reflux portion of the exam is performed with the patient in reverse Trendelenburg.  +-----+---------------+---------+-----------+----------+--------------+ RIGHTCompressibilityPhasicitySpontaneityPropertiesThrombus Aging +-----+---------------+---------+-----------+----------+--------------+ CFV  Full           Yes      Yes                                 +-----+---------------+---------+-----------+----------+--------------+   +---------+---------------+---------+-----------+----------+--------------+ LEFT     CompressibilityPhasicitySpontaneityPropertiesThrombus Aging +---------+---------------+---------+-----------+----------+--------------+ CFV      Full           Yes      Yes                                 +---------+---------------+---------+-----------+----------+--------------+ SFJ      Full                                                         +---------+---------------+---------+-----------+----------+--------------+ FV Prox  Full           Yes      Yes                                 +---------+---------------+---------+-----------+----------+--------------+ FV Mid   Full           Yes      Yes                                 +---------+---------------+---------+-----------+----------+--------------+ FV DistalFull           Yes      Yes                                 +---------+---------------+---------+-----------+----------+--------------+ PFV      Full                                                        +---------+---------------+---------+-----------+----------+--------------+ POP      Full           Yes      Yes                                 +---------+---------------+---------+-----------+----------+--------------+ PTV      Full                                                        +---------+---------------+---------+-----------+----------+--------------+  PERO     Full                                                        +---------+---------------+---------+-----------+----------+--------------+     Summary: RIGHT: - No evidence of common femoral vein obstruction. - Ultrasound characteristics of enlarged lymph nodes are noted in the groin.  LEFT: - There is no evidence of deep vein thrombosis in the lower extremity.  - No cystic structure found in the popliteal fossa. - Ultrasound characteristics of enlarged lymph nodes noted in the groin.  *See table(s) above for measurements and observations. Electronically signed by Waverly Ferrari MD on 01/09/2022 at 4:22:19 PM.    Final    DG Chest 2 View  Result Date: 01/08/2022 CLINICAL DATA:  Chest pain EXAM: CHEST - 2 VIEW COMPARISON:  09/26/2014 FINDINGS: The heart size and mediastinal contours are within normal limits. Both lungs are clear. The visualized skeletal structures are unremarkable. IMPRESSION: No  active cardiopulmonary disease. Electronically Signed   By: Alcide Clever M.D.   On: 01/08/2022 22:45    Disposition   Pt is being discharged home today in good condition.  Follow-up Plans & Appointments     Follow-up Information     Marcelino Duster, PA Follow up.   Specialties: Physician Assistant, Cardiology, Radiology Why: Hospital follow-up with Cardiology scheduled for 01/14/2022 at 8:50am. Please arrive 15 minutes early for check-in. If this date/time does not work for you, please call our office to reschedule. Contact information: [address] [place] [address] [phone]                Discharge Instructions     Diet - low sodium heart healthy   Complete by: As directed    Increase activity slowly   Complete by: As directed        Discharge Medications   Allergies as of 01/11/2022   No Known Allergies      Medication List     STOP taking these medications    levonorgestrel 20 MCG/24HR IUD Commonly known as: MIRENA   naproxen sodium 220 MG tablet Commonly known as: ALEVE       TAKE these medications    amLODipine 5 MG tablet Commonly known as: NORVASC Take 1 tablet (5 mg total) by mouth daily. Start taking on: January 12, 2022   aspirin 81 MG chewable tablet Chew 1 tablet (81 mg total) by mouth daily. Start taking on: January 12, 2022   losartan 50 MG tablet Commonly known as: COZAAR Take 1 tablet (50 mg total) by mouth daily. Start taking on: January 12, 2022   metoprolol succinate 25 MG 24 hr tablet Commonly known as: Toprol XL Take 1 tablet (25 mg total) by mouth daily.   nicotine 14 mg/24hr patch Commonly known as: NICODERM CQ - dosed in mg/24 hours Place 1 patch (14 mg total) onto the skin daily.   nitroGLYCERIN 0.4 MG SL tablet Commonly known as: NITROSTAT Place 1 tablet (0.4 mg total) under the tongue every 5 (five) minutes as needed for chest pain.   rosuvastatin 40 MG tablet Commonly known as:  CRESTOR Take 1 tablet (40 mg total) by mouth at bedtime.           Outstanding Labs/Studies   Repeat BMET at follow-up visit. Repeat  lipid panel and LFTs in 6-8 weeks.  Duration of Discharge Encounter   Greater than 30 minutes including physician time.  Signed, Corrin Parker, PA-C 01/11/2022, 5:11 PM

## 2022-01-11 NOTE — Interval H&P Note (Signed)
History and Physical Interval Note:  01/11/2022 7:32 AM  Gillis Ends  has presented today for surgery, with the diagnosis of NSTEMI.  The various methods of treatment have been discussed with the patient and family. After consideration of risks, benefits and other options for treatment, the patient has consented to  Procedure(s): LEFT HEART CATH AND CORONARY ANGIOGRAPHY (N/A) as a surgical intervention.  The patient's history has been reviewed, patient examined, no change in status, stable for surgery.  I have reviewed the patient's chart and labs.  Questions were answered to the patient's satisfaction.   Cath Lab Visit (complete for each Cath Lab visit)  Clinical Evaluation Leading to the Procedure:   ACS: Yes.    Non-ACS:    Anginal Classification: CCS IV  Anti-ischemic medical therapy: Maximal Therapy (2 or more classes of medications)  Non-Invasive Test Results: No non-invasive testing performed  Prior CABG: No previous CABG        Theron Arista [facility] 01/11/2022 7:32 AM

## 2022-01-11 NOTE — Progress Notes (Addendum)
Progress Note  Patient Name: [name] Date of Encounter: 01/11/2022  [facility] Cardiologist: New  Subjective   No CP or dyspnea  Inpatient Medications    Scheduled Meds:  amLODipine  5 mg Oral Daily   aspirin  81 mg Oral Daily   [START ON 01/12/2022] enoxaparin (LOVENOX) injection  40 mg Subcutaneous Q24H   losartan  50 mg Oral Daily   metoprolol tartrate  12.5 mg Oral BID   rosuvastatin  40 mg Oral QHS   sodium chloride flush  3 mL Intravenous Q12H   sodium chloride flush  3 mL Intravenous Q12H   Continuous Infusions:  sodium chloride     sodium chloride 1 mL/kg/hr (01/11/22 0830)   PRN Meds: sodium chloride, acetaminophen, hydrALAZINE, morphine injection, nitroGLYCERIN, ondansetron (ZOFRAN) IV, sodium chloride flush   Vital Signs    Vitals:   01/11/22 0104 01/11/22 0415 01/11/22 0737 01/11/22 0815  BP: (!) 172/105 (!) 150/85  (!) 163/101  Pulse: 68 62  (!) 58  Resp: 17 20  15   Temp: 98.2 F (36.8 C) 97.6 F (36.4 C)  98.1 F (36.7 C)  TempSrc: Oral Oral  Oral  SpO2: 96% 97% 99% 99%  Weight:      Height:        Intake/Output Summary (Last 24 hours) at 01/11/2022 1152 Last data filed at 01/11/2022 0559 Gross per 24 hour  Intake 985.94 ml  Output --  Net 985.94 ml       01/09/2022    3:32 AM 01/08/2022    9:41 PM 09/23/2020    9:56 AM  Last 3 Weights  Weight (lbs) 282 lb 6.6 oz 266 lb 15.6 oz 267 lb  Weight (kg) 128.1 kg 121.1 kg 121.11 kg      Telemetry    Sinus - Personally Reviewed   Physical Exam   GEN: NAD, WD WN Neck: Supple, no JVD Cardiac: RRR,no gallop Respiratory: CTA, no wheeze GI: Soft, NT/ND,no masses MS: No edema, radial cath site with no hematoma Neuro:  Grossly intact,no focal findings Psych: Normal affect   Labs    High Sensitivity Troponin:   Recent Labs  Lab 01/08/22 2218 01/09/22 0037 01/09/22 0341  TROPONINIHS 77* 821* 2,558*      Chemistry Recent Labs  Lab 01/08/22 2218 01/09/22 0341  01/10/22 0243  NA 138 136 133*  K 3.9 4.0 3.7  CL 110 110 106  CO2 20* 20* 20*  GLUCOSE 110* 100* 88  BUN 11 11 6   CREATININE 0.86 0.97 0.91  CALCIUM 8.5* 8.9 8.6*  MG  --  1.6*  --   PROT  --  6.5  --   ALBUMIN  --  3.4*  --   AST  --  25  --   ALT  --  12  --   ALKPHOS  --  65  --   BILITOT  --  0.6  --   GFRNONAA >60 >60 >60  ANIONGAP 8 6 7      Lipids  Recent Labs  Lab 01/09/22 0341  CHOL 142  TRIG 16  HDL 54  LDLCALC 85  CHOLHDL 2.6     Hematology Recent Labs  Lab 01/09/22 0341 01/10/22 0243 01/11/22 0233  WBC 8.5 7.3 7.2  RBC 3.71* 3.50* 3.51*  HGB 11.0* 10.2* 10.6*  HCT 33.6* 32.2* 32.3*  MCV 90.6 92.0 92.0  MCH 29.6 29.1 30.2  MCHC 32.7 31.7 32.8  RDW 13.5 13.4 13.2  PLT 136* 126*  123*      Radiology    CARDIAC CATHETERIZATION  Result Date: 01/11/2022   1st Diag lesion is 35% stenosed.   Prox LAD lesion is 20% stenosed.   LV end diastolic pressure is mildly elevated. Mild nonobstructive CAD. Mildly elevated LVEDP Plan: medical therapy with optimal BP control.   ECHOCARDIOGRAM COMPLETE  Result Date: 01/10/2022    ECHOCARDIOGRAM REPORT   Patient Name:   [name] Date of Exam: 01/10/2022 Medical Rec #:  [id number]        Height:       68.0 in Accession #:    [id number]       Weight:       282.4 lb Date of Birth:  01/28/1981         BSA:          2.367 m Patient Age:    41 years         BP:           149/51 mmHg Patient Gender: F                HR:           60 bpm. Exam Location:  Inpatient Procedure: 2D Echo, Cardiac Doppler and Color Doppler Indications:    NSTEMI  History:        Patient has no prior history of Echocardiogram examinations.                 Risk Factors:Hypertension and Current Smoker.  Sonographer:    Ross Ludwig RDCS (AE) Referring Phys: Lenon Oms IMPRESSIONS  1. Left ventricular ejection fraction, by estimation, is 55 to 60%. The left ventricle has normal function. The left ventricle has no regional wall motion abnormalities.  The left ventricular internal cavity size was mildly dilated. There is moderate concentric left ventricular hypertrophy. Left ventricular diastolic parameters were normal.  2. Right ventricular systolic function is normal. The right ventricular size is normal.  3. The mitral valve is normal in structure. Trivial mitral valve regurgitation. No evidence of mitral stenosis.  4. The aortic valve is tricuspid. Aortic valve regurgitation is mild. No aortic stenosis is present.  5. The inferior vena cava is dilated in size with >50% respiratory variability, suggesting right atrial pressure of 8 mmHg. FINDINGS  Left Ventricle: Left ventricular ejection fraction, by estimation, is 55 to 60%. The left ventricle has normal function. The left ventricle has no regional wall motion abnormalities. The left ventricular internal cavity size was mildly dilated. There is  moderate concentric left ventricular hypertrophy. Left ventricular diastolic parameters were normal. Right Ventricle: The right ventricular size is normal. Right ventricular systolic function is normal. Left Atrium: Left atrial size was normal in size. Right Atrium: Right atrial size was normal in size. Pericardium: There is no evidence of pericardial effusion. Mitral Valve: The mitral valve is normal in structure. Trivial mitral valve regurgitation. No evidence of mitral valve stenosis. Tricuspid Valve: The tricuspid valve is normal in structure. Tricuspid valve regurgitation is trivial. No evidence of tricuspid stenosis. Aortic Valve: The aortic valve is tricuspid. Aortic valve regurgitation is mild. Aortic regurgitation PHT measures 414 msec. No aortic stenosis is present. Aortic valve mean gradient measures 6.0 mmHg. Aortic valve peak gradient measures 11.6 mmHg. Aortic valve area, by VTI measures 3.27 cm. Pulmonic Valve: The pulmonic valve was normal in structure. Pulmonic valve regurgitation is not visualized. No evidence of pulmonic stenosis. Aorta: The  aortic root is normal in size and  structure. Venous: The inferior vena cava is dilated in size with greater than 50% respiratory variability, suggesting right atrial pressure of 8 mmHg. IAS/Shunts: No atrial level shunt detected by color flow Doppler.  LEFT VENTRICLE PLAX 2D LVIDd:         5.40 cm   Diastology LVIDs:         3.80 cm   LV e' medial:    9.68 cm/s LV PW:         1.80 cm   LV E/e' medial:  8.7 LV IVS:        1.60 cm   LV e' lateral:   10.80 cm/s LVOT diam:     2.30 cm   LV E/e' lateral: 7.8 LV SV:         120 LV SV Index:   51 LVOT Area:     4.15 cm  RIGHT VENTRICLE             IVC RV S prime:     17.30 cm/s  IVC diam: 2.60 cm TAPSE (M-mode): 2.2 cm LEFT ATRIUM           Index        RIGHT ATRIUM           Index LA diam:      3.90 cm 1.65 cm/m   RA Area:     20.30 cm LA Vol (A4C): 68.9 ml 29.11 ml/m  RA Volume:   58.30 ml  24.64 ml/m  AORTIC VALVE AV Area (Vmax):    3.03 cm AV Area (Vmean):   2.90 cm AV Area (VTI):     3.27 cm AV Vmax:           170.00 cm/s AV Vmean:          110.000 cm/s AV VTI:            0.366 m AV Peak Grad:      11.6 mmHg AV Mean Grad:      6.0 mmHg LVOT Vmax:         124.00 cm/s LVOT Vmean:        76.700 cm/s LVOT VTI:          0.288 m LVOT/AV VTI ratio: 0.79 AI PHT:            414 msec  AORTA Ao Root diam: 3.10 cm Ao Asc diam:  3.30 cm MITRAL VALVE MV Area (PHT): 1.78 cm    SHUNTS MV Decel Time: 426 msec    Systemic VTI:  0.29 m MV E velocity: 84.10 cm/s  Systemic Diam: 2.30 cm MV A velocity: 51.60 cm/s MV E/A ratio:  1.63 Olga Millers MD Electronically signed by Olga Millers MD Signature Date/Time: 01/10/2022/3:32:39 PM    Final      Patient Profile     41 y.o. female with no significant past medical history admitted with non-ST elevation myocardial infarction.  Assessment & Plan    1 non-ST elevation myocardial infarction-patient ruled in for non-ST elevation myocardial infarction.  Catheterization reveals mild nonobstructive coronary artery disease.   Echocardiogram shows normal LV function.  We will plan to treat medically.  Continue aspirin, statin, metoprolol.    2 hypertension-patient's blood pressure remains elevated.  We will add amlodipine 5 mg daily.  Follow blood pressure as an outpatient and adjust medications as needed.  3 tobacco abuse-patient previously counseled on discontinuing.  4 Enlarged lymph nodes on venous dopplers-will have her fu with primary care following DC.  Plan discharge today and follow-up with APP in 2 weeks.  Follow-up with me in 3 months. Greater than 30 minutes PA and physician time. D2  For questions or updates, please contact [facility] Please consult [email or web address] for contact info under        Signed, Olga Millers, MD  01/11/2022, 11:52 AM

## 2022-01-11 NOTE — Progress Notes (Signed)
Mobility Specialist: Progress Note   01/11/22 1540  Mobility  Activity Ambulated independently in hallway  Level of Assistance Independent  Assistive Device None  Distance Ambulated (ft) 1000 ft  Activity Response Tolerated well  $Mobility charge 1 Mobility   Post-Mobility: 71 HR, 173/105 (126) BP, 93% SpO2  Received pt in bed having no complaints and agreeable to mobility. Pt was asymptomatic throughout ambulation and returned to room w/o fault. Left sitting EOB w/ call bell in reach and all needs met.  [facility] Temica Righetti Mobility Specialist Mobility Specialist [address]: [phone]

## 2022-01-11 NOTE — Discharge Instructions (Signed)
Post Cardiac Catheterization: NO HEAVY LIFTING OR SEXUAL ACTIVITY X 7 DAYS. NO DRIVING X 2-3 DAYS. NO SOAKING BATHS, HOT TUBS, POOLS, ETC., X 7 DAYS.  Radial Site Care: Refer to this sheet in the next few weeks. These instructions provide you with information on caring for yourself after your procedure. Your caregiver may also give you more specific instructions. Your treatment has been planned according to current medical practices, but problems sometimes occur. Call your caregiver if you have any problems or questions after your procedure. HOME CARE INSTRUCTIONS  You may shower the day after the procedure.Remove the bandage (dressing) and gently wash the site with plain soap and water.Gently pat the site dry.   Do not apply powder or lotion to the site.   Do not submerge the affected site in water for 3 to 5 days.   Inspect the site at least twice daily.   Do not flex or bend the affected arm for 24 hours.   No lifting over 5 pounds (2.3 kg) for 5 days after your procedure.   Do not drive home if you are discharged the same day of the procedure. Have someone else drive you.  What to expect:  Any bruising will usually fade within 1 to 2 weeks.   Blood that collects in the tissue (hematoma) may be painful to the touch. It should usually decrease in size and tenderness within 1 to 2 weeks.  SEEK IMMEDIATE MEDICAL CARE IF:  You have unusual pain at the radial site.   You have redness, warmth, swelling, or pain at the radial site.   You have drainage (other than a small amount of blood on the dressing).   You have chills.   You have a fever or persistent symptoms for more than 72 hours.   You have a fever and your symptoms suddenly get worse.   Your arm becomes pale, cool, tingly, or numb.   You have heavy bleeding from the site. Hold pressure on the site.   

## 2022-01-13 NOTE — Progress Notes (Deleted)
Cardiology Office Note:    Date:  01/13/2022   ID:  [name], DOB 15-Nov-1980, MRN [id number]  PCP:  Default, Provider, Mitchell   [place] Providers Cardiologist:  Kirk Ruths, Mitchell { Click to update primary Mitchell,subspecialty Mitchell or APP then REFRESH:1}    Referring Mitchell: No ref. provider found   No chief complaint on file. ***  History of Present Illness:    [name] is a 41 y.o. female with a hx of tobacco abuse, THC use, hypertension, hyperlipidemia, and nonobstructive CAD by heart catheterization.  She presented [facility] ED 01/08/2022 with chest pain and elevated troponin.  Heart catheterization revealed only nonobstructive disease with 25% stenosis in D1 and 20% stenosis in the proximal LAD.  LVEDP was mildly elevated..  Troponin elevated to 2558.  Echocardiogram showed LVEF 55 to 60% with no regional wall motion abnormality, moderate LVH, mild AI.  Unclear etiology of her troponin elevation.  She was discharged on aspirin, Toprol 25 mg daily, amlodipine 5 mg daily, 50 mg losartan, and 40 mg Crestor.  Aggressive risk factor modification was recommended.  Hypertension and dyslipidemia were new diagnoses made that admission.  Blood pressure was markedly elevated on arrival with systolic BP in the [id number].  Lipid panel showed an LDL 85.  She was started on Crestor 40 mg daily.  She presents today for TOC follow-up.    Nonobstructive CAD Unclear etiology of her NSTEMI.  Recommend aggressive risk factor modification. Continue 81 mg aspirin, beta-blocker, amlodipine, losartan, Crestor    Hypertension New diagnosis during this admission Continue 50 mg losartan, 5 mg amlodipine, 25 mg Toprol BP today   Hyperlipidemia with LDL goal less than 70 01/09/2022: Cholesterol 142; HDL 54; LDL Cholesterol 85; Triglycerides 16; VLDL 3 Continue 40 mg Crestor Recommend repeat lipid panel in 6 weeks   Tobacco abuse She has a significant smoking history.  Counseled on  cessation.     Past Medical History:  Diagnosis Date   Anemia    Low back pain     Past Surgical History:  Procedure Laterality Date   CESAREAN SECTION  2003, 2004, 2009   LEFT HEART CATH AND CORONARY ANGIOGRAPHY N/A 01/11/2022   Procedure: LEFT HEART CATH AND CORONARY ANGIOGRAPHY;  Surgeon: [name];  Location: [place] [facility];  Service: Cardiovascular;  Laterality: N/A;    Current Medications: No outpatient medications have been marked as taking for the 01/14/22 encounter (Appointment) with [name], [place].     Allergies:   Patient has no known allergies.   Social History   Socioeconomic History   Marital status: Single    Spouse name: Not on file   Number of children: Not on file   Years of education: Not on file   Highest education level: Not on file  Occupational History   Not on file  Tobacco Use   Smoking status: Every Day    Types: Cigarettes   Smokeless tobacco: Never  Substance and Sexual Activity   Alcohol use: No   Drug use: No   Sexual activity: Yes    Birth control/protection: I.U.D.  Other Topics Concern   Not on file  Social History Narrative   Not on file   Social Determinants of Health   Financial Resource Strain: Not on file  Food Insecurity: Food Insecurity Present (09/23/2020)   Hunger Vital Sign    Worried About Running Out of Food in the Last Year: Sometimes true  Ran Out of Food in the Last Year: Sometimes true  Transportation Needs: No Transportation Needs (09/23/2020)   PRAPARE - Administrator, Civil Service (Medical): No    Lack of Transportation (Non-Medical): No  Physical Activity: Not on file  Stress: Not on file  Social Connections: Not on file     Family History: The patient's ***family history is not on file.  ROS:   Please see the history of present illness.    *** All other systems reviewed and are negative.  EKGs/Labs/Other Studies Reviewed:    The following studies were  reviewed today:  Echocardiogram 01/10/2022: Impressions:  1. Left ventricular ejection fraction, by estimation, is 55 to 60%. The  left ventricle has normal function. The left ventricle has no regional  wall motion abnormalities. The left ventricular internal cavity size was  mildly dilated. There is moderate  concentric left ventricular hypertrophy. Left ventricular diastolic  parameters were normal.   2. Right ventricular systolic function is normal. The right ventricular  size is normal.   3. The mitral valve is normal in structure. Trivial mitral valve  regurgitation. No evidence of mitral stenosis.   4. The aortic valve is tricuspid. Aortic valve regurgitation is mild. No  aortic stenosis is present.   5. The inferior vena cava is dilated in size with >50% respiratory  variability, suggesting right atrial pressure of 8 mmHg.  _____________   Left Cardiac Catheterization 01/11/2022:   1st Diag lesion is 35% stenosed.   Prox LAD lesion is 20% stenosed.   LV end diastolic pressure is mildly elevated.   Mild nonobstructive CAD.  Mildly elevated LVEDP   Plan: medical therapy with optimal BP control.   Diagnostic Dominance: Left     EKG:  EKG is *** ordered today.  The ekg ordered today demonstrates ***  Recent Labs: 01/09/2022: ALT 12; Magnesium 1.6 01/10/2022: BUN 6; Creatinine, Ser 0.91; Potassium 3.7; Sodium 133 01/11/2022: Hemoglobin 10.6; Platelets 123  Recent Lipid Panel    Component Value Date/Time   CHOL 142 01/09/2022 0341   TRIG 16 01/09/2022 0341   HDL 54 01/09/2022 0341   CHOLHDL 2.6 01/09/2022 0341   VLDL 3 01/09/2022 0341   LDLCALC 85 01/09/2022 0341     Risk Assessment/Calculations:   {Does this patient have ATRIAL FIBRILLATION?:[phone]}       Physical Exam:    VS:  LMP 12/11/2021     Wt Readings from Last 3 Encounters:  01/09/22 282 lb 6.6 oz (128.1 kg)  09/23/20 267 lb (121.1 kg)  08/12/20 260 lb (117.9 kg)     GEN: *** Well nourished,  well developed in no acute distress HEENT: Normal NECK: No JVD; No carotid bruits LYMPHATICS: No lymphadenopathy CARDIAC: ***RRR, no murmurs, rubs, gallops RESPIRATORY:  Clear to auscultation without rales, wheezing or rhonchi  ABDOMEN: Soft, non-tender, non-distended MUSCULOSKELETAL:  No edema; No deformity  SKIN: Warm and dry NEUROLOGIC:  Alert and oriented x 3 PSYCHIATRIC:  Normal affect   ASSESSMENT:    No diagnosis found. PLAN:    In order of problems listed above:  ***      {Are you ordering a CV Procedure (e.g. stress test, cath, DCCV, TEE, etc)?   Press F2        :[id number]}    Medication Adjustments/Labs and Tests Ordered: Current medicines are reviewed at length with the patient today.  Concerns regarding medicines are outlined above.  No orders of the defined types were placed in this encounter.  No orders of the defined types were placed in this encounter.   There are no Patient Instructions on file for this visit.   Signed, Marcelino Duster, [place]  01/13/2022 9:13 PM    [facility]

## 2022-01-14 ENCOUNTER — Ambulatory Visit: Payer: Self-pay | Admitting: Physician Assistant

## 2023-12-20 ENCOUNTER — Other Ambulatory Visit: Payer: Self-pay | Admitting: Obstetrics & Gynecology

## 2023-12-20 DIAGNOSIS — O09512 Supervision of elderly primigravida, second trimester: Secondary | ICD-10-CM

## 2024-01-16 DIAGNOSIS — Z98891 History of uterine scar from previous surgery: Secondary | ICD-10-CM | POA: Insufficient documentation

## 2024-01-16 DIAGNOSIS — O99210 Obesity complicating pregnancy, unspecified trimester: Secondary | ICD-10-CM | POA: Insufficient documentation

## 2024-01-16 DIAGNOSIS — O09529 Supervision of elderly multigravida, unspecified trimester: Secondary | ICD-10-CM | POA: Insufficient documentation

## 2024-01-25 ENCOUNTER — Ambulatory Visit: Payer: Self-pay

## 2024-01-25 ENCOUNTER — Other Ambulatory Visit: Payer: Self-pay | Admitting: *Deleted

## 2024-01-25 ENCOUNTER — Ambulatory Visit: Payer: Self-pay | Attending: Obstetrics & Gynecology | Admitting: Obstetrics

## 2024-01-25 VITALS — BP 126/82 | HR 68

## 2024-01-25 DIAGNOSIS — F17210 Nicotine dependence, cigarettes, uncomplicated: Secondary | ICD-10-CM | POA: Diagnosis not present

## 2024-01-25 DIAGNOSIS — O99210 Obesity complicating pregnancy, unspecified trimester: Secondary | ICD-10-CM

## 2024-01-25 DIAGNOSIS — O09522 Supervision of elderly multigravida, second trimester: Secondary | ICD-10-CM | POA: Diagnosis not present

## 2024-01-25 DIAGNOSIS — Z8679 Personal history of other diseases of the circulatory system: Secondary | ICD-10-CM | POA: Diagnosis not present

## 2024-01-25 DIAGNOSIS — O09523 Supervision of elderly multigravida, third trimester: Secondary | ICD-10-CM | POA: Diagnosis not present

## 2024-01-25 DIAGNOSIS — O132 Gestational [pregnancy-induced] hypertension without significant proteinuria, second trimester: Secondary | ICD-10-CM | POA: Diagnosis not present

## 2024-01-25 DIAGNOSIS — O2692 Pregnancy related conditions, unspecified, second trimester: Secondary | ICD-10-CM | POA: Insufficient documentation

## 2024-01-25 DIAGNOSIS — Z363 Encounter for antenatal screening for malformations: Secondary | ICD-10-CM | POA: Insufficient documentation

## 2024-01-25 DIAGNOSIS — Z3A24 24 weeks gestation of pregnancy: Secondary | ICD-10-CM

## 2024-01-25 DIAGNOSIS — O09512 Supervision of elderly primigravida, second trimester: Secondary | ICD-10-CM | POA: Diagnosis present

## 2024-01-25 DIAGNOSIS — O99332 Smoking (tobacco) complicating pregnancy, second trimester: Secondary | ICD-10-CM

## 2024-01-25 DIAGNOSIS — O99212 Obesity complicating pregnancy, second trimester: Secondary | ICD-10-CM

## 2024-01-25 DIAGNOSIS — Z98891 History of uterine scar from previous surgery: Secondary | ICD-10-CM

## 2024-01-25 DIAGNOSIS — I252 Old myocardial infarction: Secondary | ICD-10-CM | POA: Insufficient documentation

## 2024-01-25 DIAGNOSIS — E669 Obesity, unspecified: Secondary | ICD-10-CM

## 2024-01-25 DIAGNOSIS — I214 Non-ST elevation (NSTEMI) myocardial infarction: Secondary | ICD-10-CM

## 2024-01-25 DIAGNOSIS — O34219 Maternal care for unspecified type scar from previous cesarean delivery: Secondary | ICD-10-CM | POA: Insufficient documentation

## 2024-01-25 DIAGNOSIS — O169 Unspecified maternal hypertension, unspecified trimester: Secondary | ICD-10-CM

## 2024-01-25 DIAGNOSIS — O99412 Diseases of the circulatory system complicating pregnancy, second trimester: Secondary | ICD-10-CM | POA: Insufficient documentation

## 2024-01-25 NOTE — Progress Notes (Signed)
 MFM Consult Note  [name] is currently at 24 weeks and 0 days.  She was seen due to advanced maternal age (43 years old) and maternal obesity with a BMI of 40.61.  She has a history of prior NSTEMI about 3 years ago.  She has not been seen by cardiology in over a year.  She is not taking any antihypertensive medications at this time.  She denies any problems in her current pregnancy.    She has declined all screening tests for fetal aneuploidy in her current pregnancy.  She was informed that the fetal growth and amniotic fluid level were appropriate for her gestational age.   There were no obvious fetal anomalies noted on today's ultrasound exam.  However, today's exam was limited due to the fetal position.  The patient was informed that anomalies may be missed due to technical limitations. If the fetus is in a suboptimal position or maternal habitus is increased, visualization of the fetus in the maternal uterus may be impaired.  The following were discussed during today's consultation:  Advanced maternal age in pregnancy  The increased risk of fetal aneuploidy due to advanced maternal age was discussed.   Due to advanced maternal age, the patient was offered and declined an amniocentesis today for definitive diagnosis of fetal aneuploidy.  She was also offered and declined the opportunity to have a cell free DNA test drawn today to screen for fetal aneuploidy.  As she is over the age of 40, weekly fetal testing should be started at around 34 weeks.    Delivery should be considered at around 39 weeks.  History of NSTEMI in pregnancy  Due to her history of NSTEMI, I made a referral for her to the cardio-obstetrics team to help with the management of her current pregnancy.  I placed an order in Epic for this referral.    Dr. Sheena from cardiology will make a determination to determine if she needs any treatment during her pregnancy.  A follow-up exam was scheduled in our  office in 4 weeks.    The patient stated that all of her questions were answered today.  A total of 45 minutes was spent counseling and coordinating the care for this patient.  Greater than 50% of the time was spent in direct face-to-face contact.

## 2024-02-10 ENCOUNTER — Encounter: Payer: Self-pay | Admitting: *Deleted

## 2024-02-24 ENCOUNTER — Other Ambulatory Visit: Payer: Self-pay | Admitting: *Deleted

## 2024-02-24 ENCOUNTER — Ambulatory Visit (HOSPITAL_BASED_OUTPATIENT_CLINIC_OR_DEPARTMENT_OTHER)

## 2024-02-24 ENCOUNTER — Ambulatory Visit: Attending: Obstetrics | Admitting: Obstetrics

## 2024-02-24 DIAGNOSIS — O99213 Obesity complicating pregnancy, third trimester: Secondary | ICD-10-CM | POA: Diagnosis present

## 2024-02-24 DIAGNOSIS — Z3A28 28 weeks gestation of pregnancy: Secondary | ICD-10-CM | POA: Insufficient documentation

## 2024-02-24 DIAGNOSIS — O34219 Maternal care for unspecified type scar from previous cesarean delivery: Secondary | ICD-10-CM | POA: Insufficient documentation

## 2024-02-24 DIAGNOSIS — R030 Elevated blood-pressure reading, without diagnosis of hypertension: Secondary | ICD-10-CM | POA: Diagnosis not present

## 2024-02-24 DIAGNOSIS — I252 Old myocardial infarction: Secondary | ICD-10-CM | POA: Insufficient documentation

## 2024-02-24 DIAGNOSIS — Z362 Encounter for other antenatal screening follow-up: Secondary | ICD-10-CM | POA: Insufficient documentation

## 2024-02-24 DIAGNOSIS — O26893 Other specified pregnancy related conditions, third trimester: Secondary | ICD-10-CM | POA: Insufficient documentation

## 2024-02-24 DIAGNOSIS — I519 Heart disease, unspecified: Secondary | ICD-10-CM

## 2024-02-24 DIAGNOSIS — O09523 Supervision of elderly multigravida, third trimester: Secondary | ICD-10-CM | POA: Diagnosis not present

## 2024-02-24 DIAGNOSIS — O133 Gestational [pregnancy-induced] hypertension without significant proteinuria, third trimester: Secondary | ICD-10-CM

## 2024-02-24 DIAGNOSIS — O99333 Smoking (tobacco) complicating pregnancy, third trimester: Secondary | ICD-10-CM | POA: Insufficient documentation

## 2024-02-24 DIAGNOSIS — O99413 Diseases of the circulatory system complicating pregnancy, third trimester: Secondary | ICD-10-CM | POA: Diagnosis not present

## 2024-02-24 DIAGNOSIS — E669 Obesity, unspecified: Secondary | ICD-10-CM | POA: Diagnosis not present

## 2024-02-24 DIAGNOSIS — O99210 Obesity complicating pregnancy, unspecified trimester: Secondary | ICD-10-CM

## 2024-02-24 DIAGNOSIS — O10913 Unspecified pre-existing hypertension complicating pregnancy, third trimester: Secondary | ICD-10-CM

## 2024-02-24 NOTE — Progress Notes (Signed)
 MFM Consult Note  [name] is currently at 28 weeks and 2 days.  She has been followed due to advanced maternal age (43 years old) and maternal obesity with a BMI of 40.61.  She has a history of a prior NSTEMI about 3 years ago.  She has not been seen by cardiology in over a year.  She is not taking any antihypertensive medications at this time.  She denies any problems since her last exam.  Sonographic findings Single intrauterine pregnancy at 28w 2d.  Fetal cardiac activity:  Observed and appears normal. Presentation: Cephalic. Interval fetal anatomy appears normal. Fetal biometry shows the estimated fetal weight of 2 pounds 12 ounces which measuresat the 47th percentile. Amniotic fluid volume: Within normal limits. MVP: 3.93 cm. Placenta: Anterior.  Fetal movements and fetal breathing movements were noted during today's exam.  There are limitations of prenatal ultrasound such as the inability to detect certain abnormalities due to poor visualization. Various factors such as fetal position, gestational age and maternal body habitus may increase the difficulty in visualizing the fetal anatomy.    History of NSTEMI in pregnancy  I received a message from the cardio-obstetrics team reporting that they have tried to contact her 3 times to schedule an appointment.  They have not been unable to reach her.  I encouraged the patient to make an appointment with the cardio obstetrics team today.  Dr. Sheena from cardiology will make a determination to determine if she needs any treatment during her pregnancy.  Due to maternal obesity and her age, we will start weekly fetal testing at 32 weeks.    A follow-up growth scan and BPP was scheduled in 4 weeks.  The patient stated that all of her questions were answered today.  A total of 20 minutes was spent counseling and coordinating the care for this patient.  Greater than 50% of the time was spent in direct face-to-face contact.

## 2024-02-29 ENCOUNTER — Telehealth: Payer: Self-pay

## 2024-02-29 NOTE — Telephone Encounter (Signed)
 RESCHEDULED DUE TO NO TECH 10/31 TO AN NST SCAN PER DR [facility] RECOMMENDATIONS.

## 2024-03-23 ENCOUNTER — Inpatient Hospital Stay (HOSPITAL_COMMUNITY)
Admission: AD | Admit: 2024-03-23 | Discharge: 2024-04-02 | DRG: 784 | Disposition: A | Discharge status: Home / Self Care | Attending: Obstetrics and Gynecology | Admitting: Obstetrics and Gynecology

## 2024-03-23 ENCOUNTER — Ambulatory Visit (HOSPITAL_BASED_OUTPATIENT_CLINIC_OR_DEPARTMENT_OTHER): Admitting: Obstetrics and Gynecology

## 2024-03-23 ENCOUNTER — Other Ambulatory Visit: Payer: Self-pay

## 2024-03-23 ENCOUNTER — Encounter: Payer: Self-pay | Admitting: Obstetrics and Gynecology

## 2024-03-23 ENCOUNTER — Encounter (HOSPITAL_COMMUNITY): Payer: Self-pay | Admitting: Obstetrics and Gynecology

## 2024-03-23 ENCOUNTER — Ambulatory Visit (HOSPITAL_BASED_OUTPATIENT_CLINIC_OR_DEPARTMENT_OTHER)

## 2024-03-23 ENCOUNTER — Other Ambulatory Visit: Payer: Self-pay | Admitting: Obstetrics

## 2024-03-23 VITALS — BP 162/87 | HR 68

## 2024-03-23 DIAGNOSIS — O9912 Other diseases of the blood and blood-forming organs and certain disorders involving the immune mechanism complicating childbirth: Secondary | ICD-10-CM | POA: Diagnosis present

## 2024-03-23 DIAGNOSIS — I252 Old myocardial infarction: Secondary | ICD-10-CM | POA: Insufficient documentation

## 2024-03-23 DIAGNOSIS — O99413 Diseases of the circulatory system complicating pregnancy, third trimester: Secondary | ICD-10-CM

## 2024-03-23 DIAGNOSIS — N390 Urinary tract infection, site not specified: Secondary | ICD-10-CM | POA: Diagnosis present

## 2024-03-23 DIAGNOSIS — I2510 Atherosclerotic heart disease of native coronary artery without angina pectoris: Secondary | ICD-10-CM | POA: Diagnosis present

## 2024-03-23 DIAGNOSIS — Z302 Encounter for sterilization: Secondary | ICD-10-CM

## 2024-03-23 DIAGNOSIS — O34211 Maternal care for low transverse scar from previous cesarean delivery: Secondary | ICD-10-CM | POA: Diagnosis present

## 2024-03-23 DIAGNOSIS — O09523 Supervision of elderly multigravida, third trimester: Secondary | ICD-10-CM

## 2024-03-23 DIAGNOSIS — O119 Pre-existing hypertension with pre-eclampsia, unspecified trimester: Secondary | ICD-10-CM | POA: Diagnosis present

## 2024-03-23 DIAGNOSIS — O99213 Obesity complicating pregnancy, third trimester: Secondary | ICD-10-CM

## 2024-03-23 DIAGNOSIS — Z87891 Personal history of nicotine dependence: Secondary | ICD-10-CM | POA: Diagnosis not present

## 2024-03-23 DIAGNOSIS — Z833 Family history of diabetes mellitus: Secondary | ICD-10-CM | POA: Diagnosis not present

## 2024-03-23 DIAGNOSIS — O2693 Pregnancy related conditions, unspecified, third trimester: Secondary | ICD-10-CM | POA: Insufficient documentation

## 2024-03-23 DIAGNOSIS — Z98891 History of uterine scar from previous surgery: Secondary | ICD-10-CM

## 2024-03-23 DIAGNOSIS — O114 Pre-existing hypertension with pre-eclampsia, complicating childbirth: Principal | ICD-10-CM | POA: Diagnosis present

## 2024-03-23 DIAGNOSIS — O9902 Anemia complicating childbirth: Secondary | ICD-10-CM | POA: Diagnosis present

## 2024-03-23 DIAGNOSIS — R569 Unspecified convulsions: Secondary | ICD-10-CM | POA: Diagnosis not present

## 2024-03-23 DIAGNOSIS — O2340 Unspecified infection of urinary tract in pregnancy, unspecified trimester: Secondary | ICD-10-CM | POA: Insufficient documentation

## 2024-03-23 DIAGNOSIS — D696 Thrombocytopenia, unspecified: Secondary | ICD-10-CM | POA: Diagnosis present

## 2024-03-23 DIAGNOSIS — O10913 Unspecified pre-existing hypertension complicating pregnancy, third trimester: Secondary | ICD-10-CM

## 2024-03-23 DIAGNOSIS — O365930 Maternal care for other known or suspected poor fetal growth, third trimester, not applicable or unspecified: Secondary | ICD-10-CM

## 2024-03-23 DIAGNOSIS — Z3A32 32 weeks gestation of pregnancy: Secondary | ICD-10-CM

## 2024-03-23 DIAGNOSIS — O99113 Other diseases of the blood and blood-forming organs and certain disorders involving the immune mechanism complicating pregnancy, third trimester: Secondary | ICD-10-CM | POA: Diagnosis not present

## 2024-03-23 DIAGNOSIS — Z362 Encounter for other antenatal screening follow-up: Secondary | ICD-10-CM | POA: Insufficient documentation

## 2024-03-23 DIAGNOSIS — O133 Gestational [pregnancy-induced] hypertension without significant proteinuria, third trimester: Secondary | ICD-10-CM

## 2024-03-23 DIAGNOSIS — O1493 Unspecified pre-eclampsia, third trimester: Secondary | ICD-10-CM | POA: Diagnosis not present

## 2024-03-23 DIAGNOSIS — O34219 Maternal care for unspecified type scar from previous cesarean delivery: Secondary | ICD-10-CM

## 2024-03-23 DIAGNOSIS — O113 Pre-existing hypertension with pre-eclampsia, third trimester: Secondary | ICD-10-CM | POA: Diagnosis not present

## 2024-03-23 DIAGNOSIS — O99214 Obesity complicating childbirth: Secondary | ICD-10-CM | POA: Diagnosis present

## 2024-03-23 DIAGNOSIS — Z720 Tobacco use: Secondary | ICD-10-CM | POA: Diagnosis present

## 2024-03-23 DIAGNOSIS — O2343 Unspecified infection of urinary tract in pregnancy, third trimester: Secondary | ICD-10-CM | POA: Diagnosis present

## 2024-03-23 DIAGNOSIS — O9942 Diseases of the circulatory system complicating childbirth: Secondary | ICD-10-CM | POA: Diagnosis present

## 2024-03-23 DIAGNOSIS — I351 Nonrheumatic aortic (valve) insufficiency: Secondary | ICD-10-CM | POA: Diagnosis not present

## 2024-03-23 DIAGNOSIS — E669 Obesity, unspecified: Secondary | ICD-10-CM | POA: Diagnosis not present

## 2024-03-23 DIAGNOSIS — I10 Essential (primary) hypertension: Secondary | ICD-10-CM

## 2024-03-23 DIAGNOSIS — O1414 Severe pre-eclampsia complicating childbirth: Secondary | ICD-10-CM | POA: Diagnosis not present

## 2024-03-23 DIAGNOSIS — O99333 Smoking (tobacco) complicating pregnancy, third trimester: Secondary | ICD-10-CM | POA: Insufficient documentation

## 2024-03-23 DIAGNOSIS — O99334 Smoking (tobacco) complicating childbirth: Secondary | ICD-10-CM | POA: Diagnosis not present

## 2024-03-23 DIAGNOSIS — O10919 Unspecified pre-existing hypertension complicating pregnancy, unspecified trimester: Secondary | ICD-10-CM | POA: Diagnosis present

## 2024-03-23 DIAGNOSIS — O99210 Obesity complicating pregnancy, unspecified trimester: Secondary | ICD-10-CM

## 2024-03-23 DIAGNOSIS — O1092 Unspecified pre-existing hypertension complicating childbirth: Secondary | ICD-10-CM | POA: Diagnosis present

## 2024-03-23 DIAGNOSIS — O10013 Pre-existing essential hypertension complicating pregnancy, third trimester: Secondary | ICD-10-CM

## 2024-03-23 DIAGNOSIS — O368130 Decreased fetal movements, third trimester, not applicable or unspecified: Secondary | ICD-10-CM | POA: Diagnosis not present

## 2024-03-23 DIAGNOSIS — Z3A33 33 weeks gestation of pregnancy: Secondary | ICD-10-CM | POA: Diagnosis not present

## 2024-03-23 LAB — URINALYSIS, ROUTINE W REFLEX MICROSCOPIC
Bilirubin Urine: NEGATIVE
Glucose, UA: NEGATIVE mg/dL
Hgb urine dipstick: NEGATIVE
Ketones, ur: NEGATIVE mg/dL
Leukocytes,Ua: NEGATIVE
Nitrite: NEGATIVE
Protein, ur: 100 mg/dL — AB
Specific Gravity, Urine: 1.016 (ref 1.005–1.030)
pH: 6 (ref 5.0–8.0)

## 2024-03-23 LAB — COMPREHENSIVE METABOLIC PANEL WITH GFR
ALT: 12 U/L (ref 0–44)
AST: 21 U/L (ref 15–41)
Albumin: 2.8 g/dL — ABNORMAL LOW (ref 3.5–5.0)
Alkaline Phosphatase: 109 U/L (ref 38–126)
Anion gap: 11 (ref 5–15)
BUN: 10 mg/dL (ref 6–20)
CO2: 19 mmol/L — ABNORMAL LOW (ref 22–32)
Calcium: 8.8 mg/dL — ABNORMAL LOW (ref 8.9–10.3)
Chloride: 105 mmol/L (ref 98–111)
Creatinine, Ser: 0.79 mg/dL (ref 0.44–1.00)
GFR, Estimated: >60 mL/min (ref >60)
Glucose, Bld: 66 mg/dL — ABNORMAL LOW (ref 70–99)
Potassium: 4.2 mmol/L (ref 3.5–5.1)
Sodium: 135 mmol/L (ref 135–145)
Total Bilirubin: 0.3 mg/dL (ref 0.0–1.2)
Total Protein: 6.7 g/dL (ref 6.5–8.1)

## 2024-03-23 LAB — HEMOGLOBIN A1C
Hgb A1c MFr Bld: 4.6 % — ABNORMAL LOW (ref 4.8–5.6)
Mean Plasma Glucose: 85.32 mg/dL

## 2024-03-23 LAB — DIFFERENTIAL
Abs Immature Granulocytes: 0.04 K/uL (ref 0.00–0.07)
Basophils Absolute: 0 K/uL (ref 0.0–0.1)
Basophils Relative: 1 %
Eosinophils Absolute: 0.1 K/uL (ref 0.0–0.5)
Eosinophils Relative: 1 %
Immature Granulocytes: 1 %
Lymphocytes Relative: 29 %
Lymphs Abs: 2.3 K/uL (ref 0.7–4.0)
Monocytes Absolute: 0.6 K/uL (ref 0.1–1.0)
Monocytes Relative: 8 %
Neutro Abs: 4.9 K/uL (ref 1.7–7.7)
Neutrophils Relative %: 60 %

## 2024-03-23 LAB — CBC
HCT: 36.4 % (ref 36.0–46.0)
Hemoglobin: 11.7 g/dL — ABNORMAL LOW (ref 12.0–15.0)
MCH: 30.7 pg (ref 26.0–34.0)
MCHC: 32.1 g/dL (ref 30.0–36.0)
MCV: 95.5 fL (ref 80.0–100.0)
Platelets: 110 K/uL — ABNORMAL LOW (ref 150–400)
RBC: 3.81 MIL/uL — ABNORMAL LOW (ref 3.87–5.11)
RDW: 13.2 % (ref 11.5–15.5)
WBC: 7.9 K/uL (ref 4.0–10.5)
nRBC: 0 % (ref 0.0–0.2)

## 2024-03-23 LAB — HIV ANTIBODY (ROUTINE TESTING W REFLEX): HIV Screen 4th Generation wRfx: NONREACTIVE

## 2024-03-23 LAB — TYPE AND SCREEN
ABO/RH(D): O POS
Antibody Screen: NEGATIVE

## 2024-03-23 LAB — WET PREP, GENITAL
Sperm: NONE SEEN
Trich, Wet Prep: NONE SEEN
WBC, Wet Prep HPF POC: <10 (ref <10)
Yeast Wet Prep HPF POC: NONE SEEN

## 2024-03-23 LAB — PROTEIN / CREATININE RATIO, URINE
Creatinine, Urine: 139 mg/dL
Protein Creatinine Ratio: 1.13 mg/mg{creat} — ABNORMAL HIGH (ref 0.00–0.15)
Total Protein, Urine: 157 mg/dL

## 2024-03-23 LAB — HEPATITIS B SURFACE ANTIGEN: Hepatitis B Surface Ag: NONREACTIVE

## 2024-03-23 MED ORDER — NIFEDIPINE 10 MG PO CAPS
20.0000 mg | ORAL_CAPSULE | ORAL | Status: DC | PRN
  Filled 2024-03-23: qty 2

## 2024-03-23 MED ORDER — NIFEDIPINE 10 MG PO CAPS
10.0000 mg | ORAL_CAPSULE | ORAL | Status: DC | PRN
Start: 2024-03-23 — End: 2024-03-31
  Administered 2024-03-23: 10 mg via ORAL
  Filled 2024-03-23: qty 1

## 2024-03-23 MED ORDER — NIFEDIPINE 10 MG PO CAPS: 20.0000 mg | ORAL_CAPSULE | ORAL | Status: DC | PRN

## 2024-03-23 MED ORDER — PRENATAL MULTIVITAMIN CH
1.0000 | ORAL_TABLET | Freq: Every day | ORAL | Status: DC
Start: 2024-03-24 — End: 2024-03-31
  Administered 2024-03-24 – 2024-03-31 (×8): 1 via ORAL
  Filled 2024-03-23 (×8): qty 1

## 2024-03-23 MED ORDER — DOCUSATE SODIUM 100 MG PO CAPS
100.0000 mg | ORAL_CAPSULE | Freq: Every day | ORAL | Status: DC
  Administered 2024-03-25 – 2024-03-26 (×2): 100 mg via ORAL
  Filled 2024-03-23 (×4): qty 1

## 2024-03-23 MED ORDER — NIFEDIPINE ER OSMOTIC RELEASE 60 MG PO TB24
60.0000 mg | ORAL_TABLET | Freq: Every day | ORAL | Status: DC
  Administered 2024-03-23 – 2024-04-01 (×10): 60 mg via ORAL
  Filled 2024-03-23: qty 1
  Filled 2024-03-23: qty 2
  Filled 2024-03-23 (×8): qty 1

## 2024-03-23 MED ORDER — ASPIRIN 81 MG PO TBEC
162.0000 mg | DELAYED_RELEASE_TABLET | Freq: Every day | ORAL | Status: DC
  Administered 2024-03-23: 162 mg via ORAL
  Filled 2024-03-23: qty 2

## 2024-03-23 MED ORDER — CALCIUM CARBONATE ANTACID 500 MG PO CHEW: 2.0000 | CHEWABLE_TABLET | ORAL | Status: DC | PRN

## 2024-03-23 MED ORDER — ACETAMINOPHEN 500 MG PO TABS
1000.0000 mg | ORAL_TABLET | Freq: Four times a day (QID) | ORAL | Status: DC | PRN
  Administered 2024-03-25: 1000 mg via ORAL
  Filled 2024-03-23: qty 2

## 2024-03-23 MED ORDER — LABETALOL HCL 5 MG/ML IV SOLN: 40.0000 mg | INTRAVENOUS | Status: DC | PRN

## 2024-03-23 NOTE — MAU Note (Signed)
.  [name] is a 43 y.o. at [redacted]w[redacted]d here in MAU reporting: She was sent from the office for high BP but states she has no symptoms of elevated BP. Denies hx of high BP. Denies VB, LOF, or CTX. Reports +FM.  LMP: na Onset of complaint: in office  Pain score: denies Vitals:   03/23/24 1508  Resp: 16     FHT: 132  Lab orders placed from triage: ua

## 2024-03-23 NOTE — MAU Provider Note (Signed)
 History     CSN: [id number]  Arrival date and time: 03/23/24 1453   None     Chief Complaint  Patient presents with   Hypertension   HPI  Ms.[name] is a 43 y.o. female [phone] @ [redacted]w[redacted]d here in MAU with elevated BP. She has a history of cHTN, not on medications. She was dismissed from [facility] due to missed OB appointments. She was seen by MFM today and was sent here for further fetal monitoring and BP monitoring. She has no preeclampsia symptoms; no HA, no vision changes. Reports good fetal movement. She has had a laps in her prenatal care.    She has a history of a prior NSTEMI about 3 years ago. She has not been seen by cardiology in over a year. She was referred to [facility] cards but did not keep appointment that was scheduled.  She is not taking any antihypertensive medications or cardiac medication at this time.   OB History     Gravida  4   Para  3   Term  3   Preterm      AB      Living  3      SAB      IAB      Ectopic      Multiple      Live Births              Past Medical History:  Diagnosis Date   Anemia    Dyslipidemia 01/11/2022   Enlarged lymph node 01/11/2022   Low back pain    Trichomonas infection 09/25/2020   09/23/20 +, treatment sent to pharmacy     Vitamin D deficiency     Past Surgical History:  Procedure Laterality Date   CESAREAN SECTION  2003, 2004, 2009   LEFT HEART CATH AND CORONARY ANGIOGRAPHY N/A 01/11/2022   Procedure: LEFT HEART CATH AND CORONARY ANGIOGRAPHY;  Surgeon: [place], Peter M, MD;  Location: [facility];  Service: Cardiovascular;  Laterality: N/A;    Family History  Problem Relation Age of Onset   Kidney disease Father    Diabetes Maternal Grandmother    Cancer Maternal Grandmother    Diabetes Maternal Grandfather     Social History   Tobacco Use   Smoking status: Every Day    Types: Cigarettes   Smokeless tobacco: Never  Vaping Use   Vaping status: Never Used  Substance Use Topics    Alcohol use: No   Drug use: No    Allergies: No Known Allergies  Medications Prior to Admission  Medication Sig Dispense Refill Last Dose/Taking   amLODipine  (NORVASC ) 5 MG tablet Take 1 tablet (5 mg total) by mouth daily. (Patient not taking: Reported on 01/25/2024) 30 tablet 2    aspirin  81 MG chewable tablet Chew 1 tablet (81 mg total) by mouth daily. (Patient not taking: Reported on 01/25/2024)      ferrous sulfate 325 (65 FE) MG tablet Take 325 mg by mouth daily with breakfast.      losartan  (COZAAR ) 50 MG tablet Take 1 tablet (50 mg total) by mouth daily. (Patient not taking: Reported on 01/25/2024) 30 tablet 2    metoprolol  succinate (TOPROL  XL) 25 MG 24 hr tablet Take 1 tablet (25 mg total) by mouth daily. (Patient not taking: Reported on 01/25/2024) 30 tablet 11    nitroGLYCERIN  (NITROSTAT ) 0.4 MG SL tablet Place 1 tablet (0.4 mg total) under the tongue every 5 (five) minutes as  needed for chest pain. 25 tablet 2    Prenatal Vit-Fe Fumarate-FA (PRENATAL MULTIVITAMIN) TABS tablet Take 1 tablet by mouth daily at 12 noon.      rosuvastatin  (CRESTOR ) 40 MG tablet Take 1 tablet (40 mg total) by mouth at bedtime. (Patient not taking: Reported on 01/25/2024) 30 tablet 2    VITAMIN D PO Take by mouth.      Results for orders placed or performed during the hospital encounter of 03/23/24 (from the past 48 hours)  Urinalysis, Routine w reflex microscopic -Urine, Clean Catch     Status: Abnormal   Collection Time: 03/23/24  4:03 PM  Result Value Ref Range   Color, Urine YELLOW YELLOW   APPearance HAZY (A) CLEAR   Specific Gravity, Urine 1.016 1.005 - 1.030   pH 6.0 5.0 - 8.0   Glucose, UA NEGATIVE NEGATIVE mg/dL   Hgb urine dipstick NEGATIVE NEGATIVE   Bilirubin Urine NEGATIVE NEGATIVE   Ketones, ur NEGATIVE NEGATIVE mg/dL   Protein, ur 899 (A) NEGATIVE mg/dL   Nitrite NEGATIVE NEGATIVE   Leukocytes,Ua NEGATIVE NEGATIVE   RBC / HPF 0-5 0 - 5 RBC/hpf   WBC, UA 6-10 0 - 5 WBC/hpf   Bacteria,  UA RARE (A) NONE SEEN   Squamous Epithelial / HPF 0-5 0 - 5 /HPF   Mucus PRESENT     Comment: Performed at [facility], [address] [place], [place] [address]  Hepatitis B surface antigen     Status: None   Collection Time: 03/23/24  4:14 PM  Result Value Ref Range   Hepatitis B Surface Ag NON REACTIVE NON REACTIVE    Comment: Performed at [facility], [address], [place], [place] [address]  CBC     Status: Abnormal   Collection Time: 03/23/24  4:14 PM  Result Value Ref Range   WBC 7.9 4.0 - 10.5 K/uL   RBC 3.81 (L) 3.87 - 5.11 MIL/uL   Hemoglobin 11.7 (L) 12.0 - 15.0 g/dL   HCT 63.5 63.9 - 53.9 %   MCV 95.5 80.0 - 100.0 fL   MCH 30.7 26.0 - 34.0 pg   MCHC 32.1 30.0 - 36.0 g/dL   RDW 86.7 88.4 - 84.4 %   Platelets 110 (L) 150 - 400 K/uL   nRBC 0.0 0.0 - 0.2 %    Comment: Performed at [facility], [address] [place], [place] [address]  Differential     Status: None   Collection Time: 03/23/24  4:14 PM  Result Value Ref Range   Neutrophils Relative % 60 %   Neutro Abs 4.9 1.7 - 7.7 K/uL   Lymphocytes Relative 29 %   Lymphs Abs 2.3 0.7 - 4.0 K/uL   Monocytes Relative 8 %   Monocytes Absolute 0.6 0.1 - 1.0 K/uL   Eosinophils Relative 1 %   Eosinophils Absolute 0.1 0.0 - 0.5 K/uL   Basophils Relative 1 %   Basophils Absolute 0.0 0.0 - 0.1 K/uL   Immature Granulocytes 1 %   Abs Immature Granulocytes 0.04 0.00 - 0.07 K/uL    Comment: Performed at [facility], [address], [place], [place] [address]  Type and screen [facility]     Status: None   Collection Time: 03/23/24  4:14 PM  Result Value Ref Range   ABO/RH(D) O POS    Antibody Screen NEG    Sample Expiration      03/26/2024,2359 Performed  at [facility], [address], [place], [place] [address]   Hemoglobin A1c     Status: Abnormal   Collection Time: 03/23/24  4:14 PM  Result Value Ref Range   Hgb A1c MFr Bld 4.6 (L) 4.8 - 5.6 %    Comment:  (NOTE) Diagnosis of Diabetes The following HbA1c ranges recommended by the American Diabetes Association (ADA) may be used as an aid in the diagnosis of diabetes mellitus.  Hemoglobin             Suggested A1C NGSP%              Diagnosis  <5.7                   Non Diabetic  5.7-6.4                Pre-Diabetic  >6.4                   Diabetic  <7.0                   Glycemic control for                       adults with diabetes.     Mean Plasma Glucose 85.32 mg/dL    Comment: Performed at [facility], [address], [place], [place] [address]  Comprehensive metabolic panel     Status: Abnormal   Collection Time: 03/23/24  4:14 PM  Result Value Ref Range   Sodium 135 135 - 145 mmol/L   Potassium 4.2 3.5 - 5.1 mmol/L   Chloride 105 98 - 111 mmol/L   CO2 19 (L) 22 - 32 mmol/L   Glucose, Bld 66 (L) 70 - 99 mg/dL    Comment: Glucose reference range applies only to samples taken after fasting for at least 8 hours.   BUN 10 6 - 20 mg/dL   Creatinine, Ser 9.20 0.44 - 1.00 mg/dL   Calcium  8.8 (L) 8.9 - 10.3 mg/dL   Total Protein 6.7 6.5 - 8.1 g/dL   Albumin 2.8 (L) 3.5 - 5.0 g/dL   AST 21 15 - 41 U/L   ALT 12 0 - 44 U/L   Alkaline Phosphatase 109 38 - 126 U/L   Total Bilirubin 0.3 0.0 - 1.2 mg/dL   GFR, Estimated >39 >39 mL/min    Comment: (NOTE) Calculated using the CKD-EPI Creatinine Equation (2021)    Anion gap 11 5 - 15    Comment: Performed at [facility], [address] [place], [place] [address]  Wet prep, genital     Status: Abnormal   Collection Time: 03/23/24  4:52 PM  Result Value Ref Range   Yeast Wet Prep HPF POC NONE SEEN NONE SEEN   Trich, Wet Prep NONE SEEN NONE SEEN   Clue Cells Wet Prep HPF POC PRESENT (A) NONE SEEN   WBC, Wet Prep HPF POC <10 <10   Sperm NONE SEEN     Comment: Performed at [facility], [address] [place], [place] [address]    US  MFM OB FOLLOW UP Result Date:  03/23/2024 ----------------------------------------------------------------------  OBSTETRICS REPORT                       (Signed Final 03/23/2024 04:01 pm) ---------------------------------------------------------------------- Patient Info  ID #:       [id number]  D.O.B.:  1980/07/26 (43 yrs)(F)  Name:       [name]                Visit Date: 03/23/2024 01:12 pm ---------------------------------------------------------------------- Performed By  Attending:        Fredia Fresh MD        Ref. Address:     [address]                                                             [address]                                                             [place]                                                             [address]  Performed By:     Jonette Nap        [facility]                                  [facility]  Referred By:      [facility] ---------------------------------------------------------------------- Orders  #  Description                           Code        Ordered By  1  US  MFM OB FOLLOW UP                   76816.01    YU FANG  2  US  MFM FETAL BPP WO NON               23180.98    YU FANG     STRESS  3  US  MFM UA CORD DOPPLER                76820.02    YU FANG ----------------------------------------------------------------------  #  Order #                     Accession #                Episode #  1  [id number]                   [id number]                 [id number]  2  [id number]                   [id number]                 [id number]  3  [id number]                   [id number]                 [id number] ---------------------------------------------------------------------- Indications  Advanced maternal age multigravida 20+,        O28.523   third trimester  Elevated blood pressure affecting pregnancy    O13.3  in third trimester  Obesity complicating pregnancy (BMI 40)        O99.210 E66.9  Tobacco use complicating pregnancy, third      O99.333  trimester  Medical complication of pregnancy (NSTEMI)     O26.90  Previous cesarean delivery, antepartum x 3     O34.219  Encounter for other antenatal screening        Z36.2  follow-up  [redacted] weeks gestation of pregnancy                Z3A.32 ---------------------------------------------------------------------- Fetal Evaluation  Num Of Fetuses:         1  Fetal Heart Rate(bpm):  132  Cardiac Activity:       Observed  Presentation:           Cephalic  Placenta:               Anterior  P. Cord Insertion:      Previously seen  Amniotic Fluid  AFI FV:      Within normal limits  AFI Sum(cm)     %Tile       Largest Pocket(cm)  13.17           41          4.42  RUQ(cm)       RLQ(cm)       LUQ(cm)        LLQ(cm)  4.42          2.18          3.41           3.16 ---------------------------------------------------------------------- Biophysical Evaluation  Amniotic F.V:   Pocket => 2 cm             F. Tone:        Observed  F. Movement:    Observed                   Score:          6/8  F. Breathing:   Not Observed ---------------------------------------------------------------------- Biometry  BPD:      76.6  mm     G. Age:  30w 5d  7  %    CI:         73.9   %    70 - 86                                                          FL/HC:      20.2   %    19.1 - 21.3  HC:       283   mm     G. Age:  31w 0d        2.2  %    HC/AC:      1.03        0.96 - 1.17  AC:      275.8  mm     G. Age:  31w 5d         30  %    FL/BPD:     74.7   %    71 - 87  FL:       57.2  mm     G. Age:  30w 0d        2.1  %    FL/AC:      20.7   %    20 - 24  Est. FW:    1680  gm    3 lb 11 oz      10  % ---------------------------------------------------------------------- OB History  Gravidity:    4         Term:   3        Prem:   0         SAB:   0  TOP:          0       Ectopic:  0        Living: 3 ---------------------------------------------------------------------- Gestational Age  LMP:           32w 2d        Date:  08/10/23                   EDD:   05/16/24  U/S Today:     30w 6d                                        EDD:   05/26/24  Best:          bobbye 2d     Det. By:  LMP  (08/10/23)          EDD:   05/16/24 ---------------------------------------------------------------------- Anatomy  Cranium:               Previously seen        Aortic Arch:            Previously seen  Cavum:                 Previously seen        Ductal Arch:            Previously seen  Ventricles:            Appears normal         Diaphragm:  Appears normal  Choroid Plexus:        Previously seen        Stomach:                Appears normal, left                                                                        sided  Cerebellum:            Previously seen        Abdomen:                Previously seen  Posterior Fossa:       Previously seen        Abdominal Wall:         Previously seen  Face:                  Orbits and profile     Cord Vessels:           Previously seen                         previously seen  Lips:                  Previously seen        Kidneys:                Appear normal  Thoracic:              Previously seen        Bladder:                Appears normal  Heart:                 Appears normal         Spine:                  Previously seen                         (4CH, axis, and                         situs)  RVOT:                  Previously seen        Upper Extremities:      Previously seen  LVOT:                  Previously seen        Lower Extremities:      Previously seen  Other:  Fetal anatomic survey complete on prior scans. ---------------------------------------------------------------------- Doppler - Fetal Vessels  Umbilical Artery   S/D     %tile      RI    %tile      PI    %tile     PSV    ADFV    RDFV                                                      (  cm/s)   2.67       51    0.63       60    0.94       59    41.67      No      No ---------------------------------------------------------------------- Impression  Patient is here for fetal growth assessment and antenatal  testing.  She was discharged from [facility] practice after  several attempts to communicate with her failed. She has not  had prenatal care for about 2 months.  She has not had  screening for gestational diabetes.  Past medical history is significant for NSTEMI in August  2023.  Patient recovered well and had normal ejection  fraction on echocardiography.  She does not have any  cardiovascular symptoms including shortness of breath or  chest pain now.  Blood pressures today at our office were 162/87 and repeat  139/94 mmHg.  Obstetrical history is significant for 3 term vaginal deliveries.  Ultrasound  The estimated fetal weight is at the 10th percentile and the  abdominal circumference measurement at the 30th  percentile.  Normal amniotic fluid.  Fetal breathing  movements did not meet the criteria BPP.  Umbilical artery  Doppler showed normal forward diastolic flow.  BPP 6/8.  History of myocardial infarction  Patient likely to be in modified WHO Class III because of a  history of myocardial infarction.  She has a high risk for  significant maternal morbidity.  She had missed her cardio  obstetric clinic appointments.  If echocardiographic  abnormalities are present, she may be in [facility] Class IV.  She is at high risk for cardiac failure and arrhythmias.  I counseled the patient that she needs to be evaluated by our  cardiologist.  Hypertension in pregnancy  I discussed the possibility of gestational hypertension  complicating her pregnancy.  I discussed the possible  complications including preeclampsia, maternal  complications including pulmonary edema renal failure and  coagulation disturbances.  I recommended that the patient be evaluated at the MAU with   series of blood pressures and if possible cardiology  consultation.  I discussed with Dr. Cleatus, who will evaluate the patient in  the hospital. ---------------------------------------------------------------------- Recommendations  -Evaluation at the MAU with possible admission and  cardiology consultation.  - Continue weekly antenatal testing till delivery. ----------------------------------------------------------------------                  Fredia Fresh, MD Electronically Signed Final Report   03/23/2024 04:01 pm ----------------------------------------------------------------------   US  MFM FETAL BPP WO NON STRESS Result Date: 03/23/2024 ----------------------------------------------------------------------  OBSTETRICS REPORT                       (Signed Final 03/23/2024 04:01 pm) ---------------------------------------------------------------------- Patient Info  ID #:       [id number]                          D.O.B.:  1980-07-02 (43 yrs)(F)  Name:       [name]                Visit Date: 03/23/2024 01:12 pm ---------------------------------------------------------------------- Performed By  Attending:        Fredia Fresh MD        Ref. Address:     [address]  [address]                                                             [place]                                                             [address]  Performed By:     Jonette Nap        [facility]                                  [facility]  Referred By:      [facility] ---------------------------------------------------------------------- Orders  #  Description                           Code        Ordered By  1  US  MFM OB FOLLOW UP                    76816.01    YU FANG  2  US  MFM FETAL BPP WO NON               76819.01    YU FANG     STRESS  3  US  MFM UA CORD DOPPLER                76820.02    YU FANG ----------------------------------------------------------------------  #  Order #                     Accession #                Episode #  1  [id number]                   [id number]                 [id number]  2  [id number]  [id number]                 [id number]  3  [id number]                   [id number]                 [id number] ---------------------------------------------------------------------- Indications  Advanced maternal age multigravida 6+,        O51.523  third trimester  Elevated blood pressure affecting pregnancy    O13.3  in third trimester  Obesity complicating pregnancy (BMI 40)        O99.210 E66.9  Tobacco use complicating pregnancy, third      O99.333  trimester  Medical complication of pregnancy (NSTEMI)     O26.90  Previous cesarean delivery, antepartum x 3     O34.219  Encounter for other antenatal screening        Z36.2  follow-up  [redacted] weeks gestation of pregnancy                Z3A.32 ---------------------------------------------------------------------- Fetal Evaluation  Num Of Fetuses:         1  Fetal Heart Rate(bpm):  132  Cardiac Activity:       Observed  Presentation:           Cephalic  Placenta:               Anterior  P. Cord Insertion:      Previously seen  Amniotic Fluid  AFI FV:      Within normal limits  AFI Sum(cm)     %Tile       Largest Pocket(cm)  13.17           41          4.42  RUQ(cm)       RLQ(cm)       LUQ(cm)        LLQ(cm)  4.42          2.18          3.41           3.16 ---------------------------------------------------------------------- Biophysical Evaluation  Amniotic F.V:   Pocket => 2 cm             F. Tone:        Observed  F. Movement:    Observed                   Score:          6/8  F. Breathing:   Not Observed ----------------------------------------------------------------------  Biometry  BPD:      76.6  mm     G. Age:  30w 5d          7  %    CI:         73.9   %    70 - 86                                                          FL/HC:      20.2   %    19.1 - 21.3  HC:       283   mm     G. Age:  31w 0d        2.2  %  HC/AC:      1.03        0.96 - 1.17  AC:      275.8  mm     G. Age:  31w 5d         30  %    FL/BPD:     74.7   %    71 - 87  FL:       57.2  mm     G. Age:  30w 0d        2.1  %    FL/AC:      20.7   %    20 - 24  Est. FW:    1680  gm    3 lb 11 oz      10  % ---------------------------------------------------------------------- OB History  Gravidity:    4         Term:   3        Prem:   0        SAB:   0  TOP:          0       Ectopic:  0        Living: 3 ---------------------------------------------------------------------- Gestational Age  LMP:           32w 2d        Date:  08/10/23                   EDD:   05/16/24  U/S Today:     30w 6d                                        EDD:   05/26/24  Best:          bobbye 2d     Det. By:  LMP  (08/10/23)          EDD:   05/16/24 ---------------------------------------------------------------------- Anatomy  Cranium:               Previously seen        Aortic Arch:            Previously seen  Cavum:                 Previously seen        Ductal Arch:            Previously seen  Ventricles:            Appears normal         Diaphragm:              Appears normal  Choroid Plexus:        Previously seen        Stomach:                Appears normal, left                                                                        sided  Cerebellum:            Previously seen  Abdomen:                Previously seen  Posterior Fossa:       Previously seen        Abdominal Wall:         Previously seen  Face:                  Orbits and profile     Cord Vessels:           Previously seen                         previously seen  Lips:                  Previously seen        Kidneys:                Appear normal  Thoracic:               Previously seen        Bladder:                Appears normal  Heart:                 Appears normal         Spine:                  Previously seen                         (4CH, axis, and                         situs)  RVOT:                  Previously seen        Upper Extremities:      Previously seen  LVOT:                  Previously seen        Lower Extremities:      Previously seen  Other:  Fetal anatomic survey complete on prior scans. ---------------------------------------------------------------------- Doppler - Fetal Vessels  Umbilical Artery   S/D     %tile      RI    %tile      PI    %tile     PSV    ADFV    RDFV                                                     (cm/s)   2.67       51    0.63       60    0.94       59    41.67      No      No ---------------------------------------------------------------------- Impression  Patient is here for fetal growth assessment and antenatal  testing.  She was discharged from [facility] practice after  several attempts to communicate with her failed. She has not  had prenatal care for about 2 months.  She has not had  screening for gestational diabetes.  Past medical history is significant for NSTEMI in August  2023.  Patient recovered well and had normal ejection  fraction on echocardiography.  She does not have any  cardiovascular symptoms including shortness of breath or  chest pain now.  Blood pressures today at our office were 162/87 and repeat  139/94 mmHg.  Obstetrical history is significant for 3 term vaginal deliveries.  Ultrasound  The estimated fetal weight is at the 10th percentile and the  abdominal circumference measurement at the 30th  percentile.  Normal amniotic fluid.  Fetal breathing  movements did not meet the criteria BPP.  Umbilical artery  Doppler showed normal forward diastolic flow.  BPP 6/8.  History of myocardial infarction  Patient likely to be in modified WHO Class III because of a  history of myocardial infarction.  She has a high risk  for  significant maternal morbidity.  She had missed her cardio  obstetric clinic appointments.  If echocardiographic  abnormalities are present, she may be in [facility] Class IV.  She is at high risk for cardiac failure and arrhythmias.  I counseled the patient that she needs to be evaluated by our  cardiologist.  Hypertension in pregnancy  I discussed the possibility of gestational hypertension  complicating her pregnancy.  I discussed the possible  complications including preeclampsia, maternal  complications including pulmonary edema renal failure and  coagulation disturbances.  I recommended that the patient be evaluated at the MAU with  series of blood pressures and if possible cardiology  consultation.  I discussed with Dr. Cleatus, who will evaluate the patient in  the hospital. ---------------------------------------------------------------------- Recommendations  -Evaluation at the MAU with possible admission and  cardiology consultation.  - Continue weekly antenatal testing till delivery. ----------------------------------------------------------------------                  Fredia Fresh, MD Electronically Signed Final Report   03/23/2024 04:01 pm ----------------------------------------------------------------------   US  MFM UA CORD DOPPLER Result Date: 03/23/2024 ----------------------------------------------------------------------  OBSTETRICS REPORT                       (Signed Final 03/23/2024 04:01 pm) ---------------------------------------------------------------------- Patient Info  ID #:       [id number]                          D.O.B.:  [birth year] (43 yrs)(F)  Name:       [name]                Visit Date: 03/23/2024 01:12 pm ---------------------------------------------------------------------- Performed By  Attending:        Fredia Fresh MD        Ref. Address:     [address]                                                             [address]                                                              [place]  [address]  Performed By:     Jonette Nap        [facility]  Referred By:      [facility] ---------------------------------------------------------------------- Orders  #  Description                           Code        Ordered By  1  US  MFM OB FOLLOW UP                   76816.01    YU FANG  2  US  MFM FETAL BPP WO NON               76819.01    YU FANG     STRESS  3  US  MFM UA CORD DOPPLER                76820.02    YU FANG ----------------------------------------------------------------------  #  Order #                     Accession #                Episode #  1  [id number]                   [id number]  2  [id number]                   [id number]                 [id number]  3  [id number]                   [id number]                 [id number] ---------------------------------------------------------------------- Indications  Advanced maternal age multigravida 40+,        O15.523  third trimester  Elevated blood pressure affecting pregnancy    O13.3  in third trimester  Obesity complicating pregnancy (BMI 40)        O99.210 E66.9  Tobacco use complicating pregnancy, third  N00.666  trimester  Medical complication of pregnancy (NSTEMI)     O26.90  Previous cesarean delivery, antepartum x 3     O34.219  Encounter for other antenatal screening        Z36.2  follow-up  [redacted] weeks gestation of pregnancy                Z3A.32 ---------------------------------------------------------------------- Fetal Evaluation  Num Of Fetuses:         1  Fetal Heart Rate(bpm):  132  Cardiac Activity:       Observed  Presentation:           Cephalic   Placenta:               Anterior  P. Cord Insertion:      Previously seen  Amniotic Fluid  AFI FV:      Within normal limits  AFI Sum(cm)     %Tile       Largest Pocket(cm)  13.17           41          4.42  RUQ(cm)       RLQ(cm)       LUQ(cm)        LLQ(cm)  4.42          2.18          3.41           3.16 ---------------------------------------------------------------------- Biophysical Evaluation  Amniotic F.V:   Pocket => 2 cm             F. Tone:        Observed  F. Movement:    Observed                   Score:          6/8  F. Breathing:   Not Observed ---------------------------------------------------------------------- Biometry  BPD:      76.6  mm     G. Age:  30w 5d          7  %    CI:         73.9   %    70 - 86                                                          FL/HC:      20.2   %    19.1 - 21.3  HC:       283   mm     G. Age:  31w 0d        2.2  %    HC/AC:      1.03        0.96 - 1.17  AC:      275.8  mm     G. Age:  31w 5d         30  %    FL/BPD:     74.7   %    71 - 87  FL:       57.2  mm     G. Age:  30w 0d        2.1  %    FL/AC:      20.7   %    20 -  24  Est. FW:    1680  gm    3 lb 11 oz      10  % ---------------------------------------------------------------------- OB History  Gravidity:    4         Term:   3        Prem:   0        SAB:   0  TOP:          0       Ectopic:  0        Living: 3 ---------------------------------------------------------------------- Gestational Age  LMP:           32w 2d        Date:  08/10/23                   EDD:   05/16/24  U/S Today:     30w 6d                                        EDD:   05/26/24  Best:          bobbye 2d     Det. By:  LMP  (08/10/23)          EDD:   05/16/24 ---------------------------------------------------------------------- Anatomy  Cranium:               Previously seen        Aortic Arch:            Previously seen  Cavum:                 Previously seen        Ductal Arch:            Previously seen  Ventricles:             Appears normal         Diaphragm:              Appears normal  Choroid Plexus:        Previously seen        Stomach:                Appears normal, left                                                                        sided  Cerebellum:            Previously seen        Abdomen:                Previously seen  Posterior Fossa:       Previously seen        Abdominal Wall:         Previously seen  Face:                  Orbits and profile     Cord Vessels:           Previously seen  previously seen  Lips:                  Previously seen        Kidneys:                Appear normal  Thoracic:              Previously seen        Bladder:                Appears normal  Heart:                 Appears normal         Spine:                  Previously seen                         (4CH, axis, and                         situs)  RVOT:                  Previously seen        Upper Extremities:      Previously seen  LVOT:                  Previously seen        Lower Extremities:      Previously seen  Other:  Fetal anatomic survey complete on prior scans. ---------------------------------------------------------------------- Doppler - Fetal Vessels  Umbilical Artery   S/D     %tile      RI    %tile      PI    %tile     PSV    ADFV    RDFV                                                     (cm/s)   2.67       51    0.63       60    0.94       59    41.67      No      No ---------------------------------------------------------------------- Impression  Patient is here for fetal growth assessment and antenatal  testing.  She was discharged from [facility] practice after  several attempts to communicate with her failed. She has not  had prenatal care for about 2 months.  She has not had  screening for gestational diabetes.  Past medical history is significant for NSTEMI in August  2023.  Patient recovered well and had normal ejection  fraction on echocardiography.  She does not have any  cardiovascular  symptoms including shortness of breath or  chest pain now.  Blood pressures today at our office were 162/87 and repeat  139/94 mmHg.  Obstetrical history is significant for 3 term vaginal deliveries.  Ultrasound  The estimated fetal weight is at the 10th percentile and the  abdominal circumference measurement at the 30th  percentile.  Normal amniotic fluid.  Fetal breathing  movements did not meet the criteria BPP.  Umbilical artery  Doppler showed normal forward diastolic flow.  BPP 6/8.  History of myocardial infarction  Patient  likely to be in modified WHO Class III because of a  history of myocardial infarction.  She has a high risk for  significant maternal morbidity.  She had missed her cardio  obstetric clinic appointments.  If echocardiographic  abnormalities are present, she may be in [facility] Class IV.  She is at high risk for cardiac failure and arrhythmias.  I counseled the patient that she needs to be evaluated by our  cardiologist.  Hypertension in pregnancy  I discussed the possibility of gestational hypertension  complicating her pregnancy.  I discussed the possible  complications including preeclampsia, maternal  complications including pulmonary edema renal failure and  coagulation disturbances.  I recommended that the patient be evaluated at the MAU with  series of blood pressures and if possible cardiology  consultation.  I discussed with Dr. Cleatus, who will evaluate the patient in  the hospital. ---------------------------------------------------------------------- Recommendations  -Evaluation at the MAU with possible admission and  cardiology consultation.  - Continue weekly antenatal testing till delivery. ----------------------------------------------------------------------                  Fredia Fresh, MD Electronically Signed Final Report   03/23/2024 04:01 pm ----------------------------------------------------------------------      Review of Systems  Eyes:  Negative for photophobia.   Neurological:  Negative for dizziness and headaches.   Physical Exam   Resp. rate 16, height 5' 7 (1.702 m), weight 125.7 kg, last menstrual period 08/10/2023.  Patient Vitals for the past 24 hrs:  BP Temp Temp src Pulse Resp SpO2 Height Weight  03/23/24 1930 (!) 140/77 -- -- 62 -- -- -- --  03/23/24 1903 135/76 -- -- 62 -- -- -- --  03/23/24 1845 (!) 156/81 -- -- 11 -- -- -- --  03/23/24 1830 (!) 145/77 -- -- 63 -- -- -- --  03/23/24 1815 (!) 155/74 -- -- 63 -- -- -- --  03/23/24 1800 (!) 150/77 -- -- 63 -- -- -- --  03/23/24 1745 (!) 142/77 -- -- 65 -- -- -- --  03/23/24 1730 (!) 146/73 -- -- 74 -- -- -- --  03/23/24 1715 136/72 -- -- 68 -- -- -- --  03/23/24 1700 124/66 -- -- 75 -- -- -- --  03/23/24 1645 132/72 -- -- 85 -- -- -- --  03/23/24 1625 132/70 -- -- 70 -- -- -- --  03/23/24 1615 (!) 161/80 -- -- (!) 59 -- -- -- --  03/23/24 1600 (!) 143/72 -- -- (!) 59 -- -- -- --  03/23/24 1545 (!) 167/69 -- -- 61 -- -- -- --  03/23/24 1540 (!) 167/84 -- -- (!) 57 -- -- -- --  03/23/24 1526 (!) 187/111 98.2 F (36.8 C) Oral 61 -- 100 % -- --  03/23/24 1525 -- -- -- -- -- 100 % -- --  03/23/24 1508 -- -- -- -- 16 -- 5' 7 (1.702 m) 125.7 kg     Physical Exam Constitutional:      General: She is not in acute distress.    Appearance: Normal appearance. She is not ill-appearing, toxic-appearing or diaphoretic.  Musculoskeletal:        General: No swelling.  Skin:    General: Skin is warm.  Neurological:     Mental Status: She is alert.     Deep Tendon Reflexes: Reflexes normal.     Comments: Negative clonus     Fetal Tracing: Baseline: 120 bpm Variability: Moderate  Accelerations: 10x10, one 15x15 Decelerations: None Toco: None  MAU Course  Procedures  MDM  Severe range BP's on arrival 10 mg of oral procardia given with good respond 167/69 down to 143/72 PIH labs collected, patient denies HA or vision changes UPC collected Discussed BP readings with Dr.  Alfonse 60 mg XL Procardia given. Will admit for BP management and observation, fetal monitoring.  Dr. Erik aware of the patient in MAU and will admit patient.   Assessment and Plan   A:  Uncontrolled chronic HTN IUGR 6/8 BPP Limited prenatal care.   P:  Admit to [facility] Orders per MD.  Dorita Delon FERNS, NP 03/23/2024 7:37 PM

## 2024-03-23 NOTE — Progress Notes (Signed)
 Maternal-Fetal Medicine Consultation  Name: [name]  MRN: [id number]  GA: [id number] [redacted]w[redacted]d   Patient is here for fetal growth assessment and antenatal testing.  She was discharged from [facility] practice after several attempts to communicate with her failed. She has not had prenatal care for about 2 months.  She has not had screening for gestational diabetes.  Past medical history is significant for NSTEMI in August 2023.  Patient recovered well and had normal ejection fraction on echocardiography.  She does not have any cardiovascular symptoms including shortness of breath or chest pain now.  Blood pressures today at our office were 162/87 and repeat 139/94 mmHg.  Obstetrical history is significant for 3 term vaginal deliveries.  Ultrasound The estimated fetal weight is at the 10th percentile and the abdominal circumference measurement at the 30th percentile.  Normal amniotic fluid.  Fetal breathing movements did not meet the criteria BPP.  Umbilical artery Doppler showed normal forward diastolic flow.  BPP 6/8.  History of myocardial infarction Patient likely to be in modified WHO Class III because of a history of myocardial infarction.  She has a high risk for significant maternal morbidity.  She had missed her cardio obstetric clinic appointments.  If echocardiographic abnormalities are present, she may be in [facility] Class IV.  She is at high risk for cardiac failure and arrhythmias.  I counseled the patient that she needs to be evaluated by our cardiologist.  Hypertension in pregnancy I discussed the possibility of gestational hypertension complicating her pregnancy.  I discussed the possible complications including preeclampsia, maternal complications including pulmonary edema renal failure and coagulation disturbances.  I recommended that the patient be evaluated at the MAU with series of blood pressures and if possible cardiology consultation.  I discussed with Dr. Cleatus, who will  evaluate the patient in the hospital.   Recommendations -Evaluation at the MAU with possible admission and cardiology consultation. - Continue weekly antenatal testing till delivery.     Consultation including face-to-face (more than 50%) counseling 35 minutes.

## 2024-03-23 NOTE — H&P (Signed)
 FACULTY PRACTICE ANTEPARTUM ADMISSION HISTORY AND PHYSICAL NOTE   History of Present Illness: [name] is a 43 y.o. [phone] at [redacted]w[redacted]d admitted for uncontrolled chronic HTN   Patient has known hx cHTN and NSTEMI. She has been prescribed antihypertensives in the past, but is not currently taking any medications. She presented for growth US  today with MFM and was found to have severe range blood pressures. Growth US  also revealed EFW 10%ile (normal AC) with normal UAD, BPP 6/8. She was instructed to come to MAU for evaluation.   In MAU, patient initially had severe range blood pressures that responded to PO procardia. She now has mild range blood pressures. She is completely asymptomatic. Reports she is feeling well and denies HA, visual changes, CP/SOB, RUQ/epigastric pain.   P/C is pending. Serum labs are notable for plt 110, however her baseline appears to be in the 120-150s. NST is appropriate for gestational age.   History is notable for NSTEMI as above. She was referred to cardio-OB but has had issues getting scheduled. She has not completed a baseline EKG or echo this pregnancy.  She has had prior CS x 3  Patient Active Problem List   Diagnosis Date Noted   UTI in pregnancy 03/23/2024   Chronic hypertension affecting pregnancy 03/23/2024   Obesity affecting pregnancy, antepartum 01/16/2024   History of 3 cesarean sections 01/16/2024   AMA (advanced maternal age) multigravida 35+ 01/16/2024   CAD (coronary artery disease) 01/11/2022   Hypertension 01/11/2022   Tobacco abuse 01/11/2022   NSTEMI (non-ST elevated myocardial infarction) (HCC) 01/09/2022   Past Medical History:  Diagnosis Date   Anemia    Dyslipidemia 01/11/2022   Enlarged lymph node 01/11/2022   Low back pain    Trichomonas infection 09/25/2020   09/23/20 +, treatment sent to pharmacy     Vitamin D deficiency     Past Surgical History:  Procedure Laterality Date   CESAREAN SECTION  2003, 2004, 2009    LEFT HEART CATH AND CORONARY ANGIOGRAPHY N/A 01/11/2022   Procedure: LEFT HEART CATH AND CORONARY ANGIOGRAPHY;  Surgeon: [place], Peter M, MD;  Location: [facility];  Service: Cardiovascular;  Laterality: N/A;    OB History  Gravida Para Term Preterm AB Living  4 3 3   3   SAB IAB Ectopic Multiple Live Births          # Outcome Date GA Lbr Len/2nd Weight Sex Type Anes PTL Lv  4 Current           3 Term      CS-LTranv     2 Term      CS-LTranv     1 Term      CS-LTranv      Social History   Socioeconomic History   Marital status: Single    Spouse name: Not on file   Number of children: Not on file   Years of education: Not on file   Highest education level: Not on file  Occupational History   Not on file  Tobacco Use   Smoking status: Former    Types: Cigarettes   Smokeless tobacco: Never  Vaping Use   Vaping status: Never Used  Substance and Sexual Activity   Alcohol use: No   Drug use: No   Sexual activity: Yes  Other Topics Concern   Not on file  Social History Narrative   Not on file   Social Drivers of Health   Financial Resource Strain: Not  on file  Food Insecurity: Food Insecurity Present (09/23/2020)   Hunger Vital Sign    Worried About Running Out of Food in the Last Year: Sometimes true    Ran Out of Food in the Last Year: Sometimes true  Transportation Needs: No Transportation Needs (09/23/2020)   PRAPARE - Administrator, Civil Service (Medical): No    Lack of Transportation (Non-Medical): No  Physical Activity: Not on file  Stress: Not on file  Social Connections: Not on file    Family History  Problem Relation Age of Onset   Kidney disease Father    Diabetes Maternal Grandmother    Cancer Maternal Grandmother    Diabetes Maternal Grandfather     No Known Allergies  Medications Prior to Admission  Medication Sig Dispense Refill Last Dose/Taking   ferrous sulfate 325 (65 FE) MG tablet Take 325 mg by mouth daily with  breakfast.   03/23/2024   Prenatal Vit-Fe Fumarate-FA (PRENATAL MULTIVITAMIN) TABS tablet Take 1 tablet by mouth daily at 12 noon.   03/23/2024   VITAMIN D PO Take by mouth.   03/23/2024   amLODipine  (NORVASC ) 5 MG tablet Take 1 tablet (5 mg total) by mouth daily. (Patient not taking: Reported on 01/25/2024) 30 tablet 2    aspirin  81 MG chewable tablet Chew 1 tablet (81 mg total) by mouth daily. (Patient not taking: Reported on 01/25/2024)      losartan  (COZAAR ) 50 MG tablet Take 1 tablet (50 mg total) by mouth daily. (Patient not taking: Reported on 01/25/2024) 30 tablet 2    metoprolol  succinate (TOPROL  XL) 25 MG 24 hr tablet Take 1 tablet (25 mg total) by mouth daily. (Patient not taking: Reported on 01/25/2024) 30 tablet 11    nitroGLYCERIN  (NITROSTAT ) 0.4 MG SL tablet Place 1 tablet (0.4 mg total) under the tongue every 5 (five) minutes as needed for chest pain. 25 tablet 2    rosuvastatin  (CRESTOR ) 40 MG tablet Take 1 tablet (40 mg total) by mouth at bedtime. (Patient not taking: Reported on 01/25/2024) 30 tablet 2     Review of Systems - Negative except as noted in HPI  Vitals:  BP 135/76   Pulse 62   Temp 98.2 F (36.8 C) (Oral)   Resp 16   Ht 5' 7 (1.702 m)   Wt 125.7 kg   LMP 08/10/2023   SpO2 100%   BMI 43.40 kg/m  Physical Examination: CONSTITUTIONAL: Well-developed, well-nourished female in no acute distress.  NEUROLOGIC: Alert and oriented to person, place, and time. CARDIOVASCULAR: Normal heart rate noted, regular rhythm RESPIRATORY: Effort normal, no problems with respiration noted ABDOMEN: Soft, nontender, nondistended, gravid.  Cervix: Deferred Membranes:intact Fetal Monitoring: 130bpm, mod variability, 10x10, no decels - discontinuous, will try to get full 20 min NST Tocometer: Flat  Labs:  Results for orders placed or performed during the hospital encounter of 03/23/24 (from the past 24 hours)  Urinalysis, Routine w reflex microscopic -Urine, Clean Catch    Collection Time: 03/23/24  4:03 PM  Result Value Ref Range   Color, Urine YELLOW YELLOW   APPearance HAZY (A) CLEAR   Specific Gravity, Urine 1.016 1.005 - 1.030   pH 6.0 5.0 - 8.0   Glucose, UA NEGATIVE NEGATIVE mg/dL   Hgb urine dipstick NEGATIVE NEGATIVE   Bilirubin Urine NEGATIVE NEGATIVE   Ketones, ur NEGATIVE NEGATIVE mg/dL   Protein, ur 899 (A) NEGATIVE mg/dL   Nitrite NEGATIVE NEGATIVE   Leukocytes,Ua NEGATIVE NEGATIVE   RBC /  HPF 0-5 0 - 5 RBC/hpf   WBC, UA 6-10 0 - 5 WBC/hpf   Bacteria, UA RARE (A) NONE SEEN   Squamous Epithelial / HPF 0-5 0 - 5 /HPF   Mucus PRESENT   Hepatitis B surface antigen   Collection Time: 03/23/24  4:14 PM  Result Value Ref Range   Hepatitis B Surface Ag NON REACTIVE NON REACTIVE  CBC   Collection Time: 03/23/24  4:14 PM  Result Value Ref Range   WBC 7.9 4.0 - 10.5 K/uL   RBC 3.81 (L) 3.87 - 5.11 MIL/uL   Hemoglobin 11.7 (L) 12.0 - 15.0 g/dL   HCT 63.5 63.9 - 53.9 %   MCV 95.5 80.0 - 100.0 fL   MCH 30.7 26.0 - 34.0 pg   MCHC 32.1 30.0 - 36.0 g/dL   RDW 86.7 88.4 - 84.4 %   Platelets 110 (L) 150 - 400 K/uL   nRBC 0.0 0.0 - 0.2 %  Differential   Collection Time: 03/23/24  4:14 PM  Result Value Ref Range   Neutrophils Relative % 60 %   Neutro Abs 4.9 1.7 - 7.7 K/uL   Lymphocytes Relative 29 %   Lymphs Abs 2.3 0.7 - 4.0 K/uL   Monocytes Relative 8 %   Monocytes Absolute 0.6 0.1 - 1.0 K/uL   Eosinophils Relative 1 %   Eosinophils Absolute 0.1 0.0 - 0.5 K/uL   Basophils Relative 1 %   Basophils Absolute 0.0 0.0 - 0.1 K/uL   Immature Granulocytes 1 %   Abs Immature Granulocytes 0.04 0.00 - 0.07 K/uL  Hemoglobin A1c   Collection Time: 03/23/24  4:14 PM  Result Value Ref Range   Hgb A1c MFr Bld 4.6 (L) 4.8 - 5.6 %   Mean Plasma Glucose 85.32 mg/dL  Comprehensive metabolic panel   Collection Time: 03/23/24  4:14 PM  Result Value Ref Range   Sodium 135 135 - 145 mmol/L   Potassium 4.2 3.5 - 5.1 mmol/L   Chloride 105 98 - 111 mmol/L    CO2 19 (L) 22 - 32 mmol/L   Glucose, Bld 66 (L) 70 - 99 mg/dL   BUN 10 6 - 20 mg/dL   Creatinine, Ser 9.20 0.44 - 1.00 mg/dL   Calcium  8.8 (L) 8.9 - 10.3 mg/dL   Total Protein 6.7 6.5 - 8.1 g/dL   Albumin 2.8 (L) 3.5 - 5.0 g/dL   AST 21 15 - 41 U/L   ALT 12 0 - 44 U/L   Alkaline Phosphatase 109 38 - 126 U/L   Total Bilirubin 0.3 0.0 - 1.2 mg/dL   GFR, Estimated >39 >39 mL/min   Anion gap 11 5 - 15  Type and screen [facility]   Collection Time: 03/23/24  4:14 PM  Result Value Ref Range   ABO/RH(D) O POS    Antibody Screen NEG    Sample Expiration      03/26/2024,2359 Performed at [facility], [address], [place], [place] [address]   Wet prep, genital   Collection Time: 03/23/24  4:52 PM  Result Value Ref Range   Yeast Wet Prep HPF POC NONE SEEN NONE SEEN   Trich, Wet Prep NONE SEEN NONE SEEN   Clue Cells Wet Prep HPF POC PRESENT (A) NONE SEEN   WBC, Wet Prep HPF POC <10 <10   Sperm NONE SEEN     Imaging Studies: US  MFM OB FOLLOW UP Result Date: 03/23/2024 ----------------------------------------------------------------------  OBSTETRICS REPORT                       (  Signed Final 03/23/2024 04:01 pm) ---------------------------------------------------------------------- Patient Info  ID #:       [id number]                          D.O.B.:  06/23/1980 (43 yrs)(F)  Name:       [name]                Visit Date: 03/23/2024 01:12 pm ---------------------------------------------------------------------- Performed By  Attending:        Fredia Fresh MD        Ref. Address:     [address]                                                             [address]                                                             [place]                                                             [address]  Performed By:     Jonette Nap        [facility]                                  [facility]  Referred By:      Marlis Leash                    OB GYN ---------------------------------------------------------------------- Orders  #  Description                           Code        Ordered By  1  US  MFM OB FOLLOW UP  23183.98    YU FANG  2  US  MFM FETAL BPP WO NON               76819.01    YU FANG     STRESS  3  US  MFM UA CORD DOPPLER                76820.02    YU FANG ----------------------------------------------------------------------  #  Order #                     Accession #                Episode #  1  [id number]                   [id number]                 [id number]  2  [id number]                   [id number]                 [id number]  3  [id number]                   [id number]                 [id number] ---------------------------------------------------------------------- Indications  Advanced maternal age multigravida 43+,        O55.523  third trimester  Elevated blood pressure affecting pregnancy    O13.3  in third trimester  Obesity complicating pregnancy (BMI 40)        O99.210 E66.9  Tobacco use complicating pregnancy, third      O99.333  trimester  Medical complication of pregnancy (NSTEMI)     O26.90  Previous cesarean delivery, antepartum x 3     O34.219  Encounter for other antenatal screening        Z36.2  follow-up  [redacted] weeks gestation of pregnancy                Z3A.32 ---------------------------------------------------------------------- Fetal Evaluation  Num Of Fetuses:         1  Fetal Heart Rate(bpm):  132  Cardiac Activity:       Observed  Presentation:           Cephalic  Placenta:               Anterior  P. Cord Insertion:      Previously seen  Amniotic Fluid  AFI FV:      Within normal limits  AFI Sum(cm)     %Tile       Largest Pocket(cm)  13.17           41          4.42  RUQ(cm)       RLQ(cm)       LUQ(cm)        LLQ(cm)   4.42          2.18          3.41           3.16 ---------------------------------------------------------------------- Biophysical Evaluation  Amniotic F.V:   Pocket => 2 cm             F. Tone:        Observed  F. Movement:    Observed  Score:          6/8  F. Breathing:   Not Observed ---------------------------------------------------------------------- Biometry  BPD:      76.6  mm     G. Age:  30w 5d          7  %    CI:         73.9   %    70 - 86                                                          FL/HC:      20.2   %    19.1 - 21.3  HC:       283   mm     G. Age:  31w 0d        2.2  %    HC/AC:      1.03        0.96 - 1.17  AC:      275.8  mm     G. Age:  31w 5d         30  %    FL/BPD:     74.7   %    71 - 87  FL:       57.2  mm     G. Age:  30w 0d        2.1  %    FL/AC:      20.7   %    20 - 24  Est. FW:    1680  gm    3 lb 11 oz      10  % ---------------------------------------------------------------------- OB History  Gravidity:    4         Term:   3        Prem:   0        SAB:   0  TOP:          0       Ectopic:  0        Living: 3 ---------------------------------------------------------------------- Gestational Age  LMP:           32w 2d        Date:  08/10/23                   EDD:   05/16/24  U/S Today:     30w 6d                                        EDD:   05/26/24  Best:          bobbye 2d     Det. By:  LMP  (08/10/23)          EDD:   05/16/24 ---------------------------------------------------------------------- Anatomy  Cranium:               Previously seen        Aortic Arch:            Previously seen  Cavum:                 Previously seen        Ductal Arch:  Previously seen  Ventricles:            Appears normal         Diaphragm:              Appears normal  Choroid Plexus:        Previously seen        Stomach:                Appears normal, left                                                                        sided  Cerebellum:             Previously seen        Abdomen:                Previously seen  Posterior Fossa:       Previously seen        Abdominal Wall:         Previously seen  Face:                  Orbits and profile     Cord Vessels:           Previously seen                         previously seen  Lips:                  Previously seen        Kidneys:                Appear normal  Thoracic:              Previously seen        Bladder:                Appears normal  Heart:                 Appears normal         Spine:                  Previously seen                         (4CH, axis, and                         situs)  RVOT:                  Previously seen        Upper Extremities:      Previously seen  LVOT:                  Previously seen        Lower Extremities:      Previously seen  Other:  Fetal anatomic survey complete on prior scans. ---------------------------------------------------------------------- Doppler - Fetal Vessels  Umbilical Artery   S/D     %tile      RI    %tile      PI    %tile  PSV    ADFV    RDFV                                                     (cm/s)   2.67       51    0.63       60    0.94       59    41.67      No      No ---------------------------------------------------------------------- Impression  Patient is here for fetal growth assessment and antenatal  testing.  She was discharged from [facility] practice after  several attempts to communicate with her failed. She has not  had prenatal care for about 2 months.  She has not had  screening for gestational diabetes.  Past medical history is significant for NSTEMI in August  2023.  Patient recovered well and had normal ejection  fraction on echocardiography.  She does not have any  cardiovascular symptoms including shortness of breath or  chest pain now.  Blood pressures today at our office were 162/87 and repeat  139/94 mmHg.  Obstetrical history is significant for 3 term vaginal deliveries.  Ultrasound  The estimated fetal weight is at the 10th  percentile and the  abdominal circumference measurement at the 30th  percentile.  Normal amniotic fluid.  Fetal breathing  movements did not meet the criteria BPP.  Umbilical artery  Doppler showed normal forward diastolic flow.  BPP 6/8.  History of myocardial infarction  Patient likely to be in modified WHO Class III because of a  history of myocardial infarction.  She has a high risk for  significant maternal morbidity.  She had missed her cardio  obstetric clinic appointments.  If echocardiographic  abnormalities are present, she may be in [facility] Class IV.  She is at high risk for cardiac failure and arrhythmias.  I counseled the patient that she needs to be evaluated by our  cardiologist.  Hypertension in pregnancy  I discussed the possibility of gestational hypertension  complicating her pregnancy.  I discussed the possible  complications including preeclampsia, maternal  complications including pulmonary edema renal failure and  coagulation disturbances.  I recommended that the patient be evaluated at the MAU with  series of blood pressures and if possible cardiology  consultation.  I discussed with Dr. Cleatus, who will evaluate the patient in  the hospital. ---------------------------------------------------------------------- Recommendations  -Evaluation at the MAU with possible admission and  cardiology consultation.  - Continue weekly antenatal testing till delivery. ----------------------------------------------------------------------                  Fredia Fresh, MD Electronically Signed Final Report   03/23/2024 04:01 pm ----------------------------------------------------------------------   US  MFM FETAL BPP WO NON STRESS Result Date: 03/23/2024 ----------------------------------------------------------------------  OBSTETRICS REPORT                       (Signed Final 03/23/2024 04:01 pm) ---------------------------------------------------------------------- Patient Info  ID #:       [id number]                           D.O.B.:  Nov 05, 1980 (43 yrs)(F)  Name:       [name]  Visit Date: 03/23/2024 01:12 pm ---------------------------------------------------------------------- Performed By  Attending:        Fredia Fresh MD        Ref. Address:     [address]                                                             [address]                                                             [place]                                                             [address]  Performed By:     Jonette Nap        Location[facility]                                  [facility]  Referred By:      [facility] ---------------------------------------------------------------------- Orders  #  Description                           Code        Ordered By  1  US  MFM OB FOLLOW UP                   76816.01    YU FANG  2  US  MFM FETAL BPP WO NON               76819.01    YU FANG     STRESS  3  US  MFM UA CORD DOPPLER                23179.97    YU FANG ----------------------------------------------------------------------  #  Order #                     Accession #                Episode #  1  [id number]                   [id number]                 [id number]  2  [id number]                   [id number]                 [id number]  3  [id number]                   [id number]                 [id number] ---------------------------------------------------------------------- Indications  Advanced maternal age multigravida 14+,        O47.523  third trimester  Elevated blood pressure affecting pregnancy    O13.3  in third trimester  Obesity complicating pregnancy (BMI 40)        O99.210 E66.9  Tobacco use complicating pregnancy, third      O99.333  trimester  Medical complication of pregnancy (NSTEMI)      O26.90  Previous cesarean delivery, antepartum x 3     O34.219  Encounter for other antenatal screening        Z36.2  follow-up  [redacted] weeks gestation of pregnancy                Z3A.32 ---------------------------------------------------------------------- Fetal Evaluation  Num Of Fetuses:         1  Fetal Heart Rate(bpm):  132  Cardiac Activity:       Observed  Presentation:           Cephalic  Placenta:               Anterior  P. Cord Insertion:      Previously seen  Amniotic Fluid  AFI FV:      Within normal limits  AFI Sum(cm)     %Tile       Largest Pocket(cm)  13.17           41          4.42  RUQ(cm)       RLQ(cm)       LUQ(cm)        LLQ(cm)  4.42          2.18          3.41           3.16 ---------------------------------------------------------------------- Biophysical Evaluation  Amniotic F.V:   Pocket => 2 cm             F. Tone:        Observed  F. Movement:    Observed                   Score:          6/8  F. Breathing:   Not Observed ---------------------------------------------------------------------- Biometry  BPD:      76.6  mm     G. Age:  30w 5d          7  %    CI:         73.9   %    70 - 86  FL/HC:      20.2   %    19.1 - 21.3  HC:       283   mm     G. Age:  31w 0d        2.2  %    HC/AC:      1.03        0.96 - 1.17  AC:      275.8  mm     G. Age:  31w 5d         30  %    FL/BPD:     74.7   %    71 - 87  FL:       57.2  mm     G. Age:  30w 0d        2.1  %    FL/AC:      20.7   %    20 - 24  Est. FW:    1680  gm    3 lb 11 oz      10  % ---------------------------------------------------------------------- OB History  Gravidity:    4         Term:   3        Prem:   0        SAB:   0  TOP:          0       Ectopic:  0        Living: 3 ---------------------------------------------------------------------- Gestational Age  LMP:           32w 2d        Date:  08/10/23                   EDD:   05/16/24  U/S Today:     30w 6d                                         EDD:   05/26/24  Best:          bobbye 2d     Det. By:  LMP  (08/10/23)          EDD:   05/16/24 ---------------------------------------------------------------------- Anatomy  Cranium:               Previously seen        Aortic Arch:            Previously seen  Cavum:                 Previously seen        Ductal Arch:            Previously seen  Ventricles:            Appears normal         Diaphragm:              Appears normal  Choroid Plexus:        Previously seen        Stomach:                Appears normal, left  sided  Cerebellum:            Previously seen        Abdomen:                Previously seen  Posterior Fossa:       Previously seen        Abdominal Wall:         Previously seen  Face:                  Orbits and profile     Cord Vessels:           Previously seen                         previously seen  Lips:                  Previously seen        Kidneys:                Appear normal  Thoracic:              Previously seen        Bladder:                Appears normal  Heart:                 Appears normal         Spine:                  Previously seen                         (4CH, axis, and                         situs)  RVOT:                  Previously seen        Upper Extremities:      Previously seen  LVOT:                  Previously seen        Lower Extremities:      Previously seen  Other:  Fetal anatomic survey complete on prior scans. ---------------------------------------------------------------------- Doppler - Fetal Vessels  Umbilical Artery   S/D     %tile      RI    %tile      PI    %tile     PSV    ADFV    RDFV                                                     (cm/s)   2.67       51    0.63       60    0.94       59    41.67      No      No ---------------------------------------------------------------------- Impression  Patient is here for fetal growth assessment and antenatal   testing.  She was discharged from [facility] practice after  several attempts to communicate with her failed. She has not  had prenatal care for about 2  months.  She has not had  screening for gestational diabetes.  Past medical history is significant for NSTEMI in August  2023.  Patient recovered well and had normal ejection  fraction on echocardiography.  She does not have any  cardiovascular symptoms including shortness of breath or  chest pain now.  Blood pressures today at our office were 162/87 and repeat  139/94 mmHg.  Obstetrical history is significant for 3 term vaginal deliveries.  Ultrasound  The estimated fetal weight is at the 10th percentile and the  abdominal circumference measurement at the 30th  percentile.  Normal amniotic fluid.  Fetal breathing  movements did not meet the criteria BPP.  Umbilical artery  Doppler showed normal forward diastolic flow.  BPP 6/8.  History of myocardial infarction  Patient likely to be in modified WHO Class III because of a  history of myocardial infarction.  She has a high risk for  significant maternal morbidity.  She had missed her cardio  obstetric clinic appointments.  If echocardiographic  abnormalities are present, she may be in [facility] Class IV.  She is at high risk for cardiac failure and arrhythmias.  I counseled the patient that she needs to be evaluated by our  cardiologist.  Hypertension in pregnancy  I discussed the possibility of gestational hypertension  complicating her pregnancy.  I discussed the possible  complications including preeclampsia, maternal  complications including pulmonary edema renal failure and  coagulation disturbances.  I recommended that the patient be evaluated at the MAU with  series of blood pressures and if possible cardiology  consultation.  I discussed with Dr. Cleatus, who will evaluate the patient in  the hospital. ---------------------------------------------------------------------- Recommendations  -Evaluation at the MAU with  possible admission and  cardiology consultation.  - Continue weekly antenatal testing till delivery. ----------------------------------------------------------------------                  Fredia Fresh, MD Electronically Signed Final Report   03/23/2024 04:01 pm ----------------------------------------------------------------------   US  MFM UA CORD DOPPLER Result Date: 03/23/2024 ----------------------------------------------------------------------  OBSTETRICS REPORT                       (Signed Final 03/23/2024 04:01 pm) ---------------------------------------------------------------------- Patient Info  ID #:       [id number]                          D.O.B.:  02-Sep-1980 (43 yrs)(F)  Name:       [name]                Visit Date: 03/23/2024 01:12 pm ---------------------------------------------------------------------- Performed By  Attending:        Fredia Fresh MD        Ref. Address:     [address]                                                             [address]  [place]                                                             [address]  Performed By:     Jonette Nap        [facility]  Referred By:      [facility] ---------------------------------------------------------------------- Orders  #  Description                           Code        Ordered By  1  US  MFM OB FOLLOW UP                   76816.01    YU FANG  2  US  MFM FETAL BPP WO NON               76819.01    YU FANG     STRESS  3  US  MFM UA CORD DOPPLER                76820.02    YU FANG ----------------------------------------------------------------------  #  Order #                     Accession  #                Episode #  1  [id number]                   [id number]                 [id number]  2  [id number]                   [id number]                 [id number]  3  [id number]                   [id number]  [id number] ---------------------------------------------------------------------- Indications  Advanced maternal age multigravida 56+,        O60.523  third trimester  Elevated blood pressure affecting pregnancy    O13.3  in third trimester  Obesity complicating pregnancy (BMI 40)        O99.210 E66.9  Tobacco use complicating pregnancy, third      O99.333  trimester  Medical complication of pregnancy (NSTEMI)     O26.90  Previous cesarean delivery, antepartum x 3     O34.219  Encounter for other antenatal screening        Z36.2  follow-up  [redacted] weeks gestation of pregnancy                Z3A.32 ---------------------------------------------------------------------- Fetal Evaluation  Num Of Fetuses:         1  Fetal Heart Rate(bpm):  132  Cardiac Activity:       Observed  Presentation:           Cephalic  Placenta:               Anterior  P. Cord Insertion:      Previously seen  Amniotic Fluid  AFI FV:      Within normal limits  AFI Sum(cm)     %Tile       Largest Pocket(cm)  13.17           41          4.42  RUQ(cm)       RLQ(cm)       LUQ(cm)        LLQ(cm)  4.42          2.18          3.41           3.16 ---------------------------------------------------------------------- Biophysical Evaluation  Amniotic F.V:   Pocket => 2 cm             F. Tone:        Observed  F. Movement:    Observed                   Score:          6/8  F. Breathing:   Not Observed ---------------------------------------------------------------------- Biometry  BPD:      76.6  mm     G. Age:  30w 5d          7  %    CI:         73.9   %    70 - 86                                                          FL/HC:      20.2   %    19.1 - 21.3  HC:       283   mm     G. Age:  31w 0d        2.2  %    HC/AC:      1.03        0.96  - 1.17  AC:      275.8  mm     G. Age:  31w 5d         30  %    FL/BPD:  74.7   %    71 - 87  FL:       57.2  mm     G. Age:  30w 0d        2.1  %    FL/AC:      20.7   %    20 - 24  Est. FW:    1680  gm    3 lb 11 oz      10  % ---------------------------------------------------------------------- OB History  Gravidity:    4         Term:   3        Prem:   0        SAB:   0  TOP:          0       Ectopic:  0        Living: 3 ---------------------------------------------------------------------- Gestational Age  LMP:           32w 2d        Date:  08/10/23                   EDD:   05/16/24  U/S Today:     30w 6d                                        EDD:   05/26/24  Best:          bobbye 2d     Det. By:  LMP  (08/10/23)          EDD:   05/16/24 ---------------------------------------------------------------------- Anatomy  Cranium:               Previously seen        Aortic Arch:            Previously seen  Cavum:                 Previously seen        Ductal Arch:            Previously seen  Ventricles:            Appears normal         Diaphragm:              Appears normal  Choroid Plexus:        Previously seen        Stomach:                Appears normal, left                                                                        sided  Cerebellum:            Previously seen        Abdomen:                Previously seen  Posterior Fossa:       Previously seen        Abdominal Wall:         Previously seen  Face:  Orbits and profile     Cord Vessels:           Previously seen                         previously seen  Lips:                  Previously seen        Kidneys:                Appear normal  Thoracic:              Previously seen        Bladder:                Appears normal  Heart:                 Appears normal         Spine:                  Previously seen                         (4CH, axis, and                         situs)  RVOT:                  Previously seen        Upper  Extremities:      Previously seen  LVOT:                  Previously seen        Lower Extremities:      Previously seen  Other:  Fetal anatomic survey complete on prior scans. ---------------------------------------------------------------------- Doppler - Fetal Vessels  Umbilical Artery   S/D     %tile      RI    %tile      PI    %tile     PSV    ADFV    RDFV                                                     (cm/s)   2.67       51    0.63       60    0.94       59    41.67      No      No ---------------------------------------------------------------------- Impression  Patient is here for fetal growth assessment and antenatal  testing.  She was discharged from [facility] practice after  several attempts to communicate with her failed. She has not  had prenatal care for about 2 months.  She has not had  screening for gestational diabetes.  Past medical history is significant for NSTEMI in August  2023.  Patient recovered well and had normal ejection  fraction on echocardiography.  She does not have any  cardiovascular symptoms including shortness of breath or  chest pain now.  Blood pressures today at our office were 162/87 and repeat  139/94 mmHg.  Obstetrical history is significant for 3 term vaginal deliveries.  Ultrasound  The estimated fetal weight is at the 10th percentile and  the  abdominal circumference measurement at the 30th  percentile.  Normal amniotic fluid.  Fetal breathing  movements did not meet the criteria BPP.  Umbilical artery  Doppler showed normal forward diastolic flow.  BPP 6/8.  History of myocardial infarction  Patient likely to be in modified WHO Class III because of a  history of myocardial infarction.  She has a high risk for  significant maternal morbidity.  She had missed her cardio  obstetric clinic appointments.  If echocardiographic  abnormalities are present, she may be in [facility] Class IV.  She is at high risk for cardiac failure and arrhythmias.  I counseled the patient that she needs  to be evaluated by our  cardiologist.  Hypertension in pregnancy  I discussed the possibility of gestational hypertension  complicating her pregnancy.  I discussed the possible  complications including preeclampsia, maternal  complications including pulmonary edema renal failure and  coagulation disturbances.  I recommended that the patient be evaluated at the MAU with  series of blood pressures and if possible cardiology  consultation.  I discussed with Dr. Cleatus, who will evaluate the patient in  the hospital. ---------------------------------------------------------------------- Recommendations  -Evaluation at the MAU with possible admission and  cardiology consultation.  - Continue weekly antenatal testing till delivery. ----------------------------------------------------------------------                  Fredia Fresh, MD Electronically Signed Final Report   03/23/2024 04:01 pm ----------------------------------------------------------------------   US  MFM OB FOLLOW UP Result Date: 02/25/2024 ----------------------------------------------------------------------  OBSTETRICS REPORT                        (Signed Final 02/25/2024 12:08 am) ---------------------------------------------------------------------- Patient Info  ID #:       [id number]                          D.O.B.:  [birth year] (43 yrs)(F)  Name:       [name]                Visit Date: 02/24/2024 08:28 am ---------------------------------------------------------------------- Performed By  Attending:        Steffan Keys MD         Ref. Address:      [address]                                                              [address]                                                              [place]                                                              [address]  Performed By:     Rumaldo Sharps RDMS  Location[facility]  Referred By:      [facility] ---------------------------------------------------------------------- Orders  #  Description                           Code        Ordered By  1  US  MFM OB FOLLOW UP                   76816.01    BABARA KEYS ----------------------------------------------------------------------  #  Order #                     Accession #                Episode #  1  [id number]                   [id number]                 [id number] ---------------------------------------------------------------------- Indications  Advanced maternal age multigravida 53+,         O76.523  third trimester  Elevated blood pressure affecting pregnancy     O13.3  in third trimester  Obesity complicating pregnancy (BMI 40)         O99.210 E66.9  Tobacco use complicating pregnancy, third       O99.333  trimester  Medical complication of pregnancy (NSTEMI)      O26.90  Previous cesarean delivery, antepartum x 3      O34.219  Encounter for other antenatal screening         Z36.2  follow-up  [redacted] weeks gestation of pregnancy                 Z3A.28 ---------------------------------------------------------------------- Vital Signs  BP:          127/65 ---------------------------------------------------------------------- Fetal Evaluation  Num Of Fetuses:          1  Fetal Heart Rate(bpm):   133  Cardiac Activity:        Observed  Presentation:            Cephalic  Placenta:  Anterior  P. Cord Insertion:       Previously seen  Amniotic Fluid  AFI FV:      Within normal limits                              Largest Pocket(cm)                              3.93 ---------------------------------------------------------------------- Biophysical Evaluation  Amniotic F.V:   Within normal limits       F. Tone:         Observed  F. Movement:    Observed                   Score:            8/8  F. Breathing:   Observed ---------------------------------------------------------------------- Biometry  BPD:      70.1  mm     G. Age:  28w 1d         34  %    CI:          73.4  %    70 - 86                                                          FL/HC:       20.2  %    18.8 - 20.6  HC:        260  mm     G. Age:  28w 2d         19  %    HC/AC:       1.05       1.05 - 1.21  AC:        248  mm     G. Age:  29w 0d         66  %    FL/BPD:      75.0  %    71 - 87  FL:       52.6  mm     G. Age:  28w 0d         27  %    FL/AC:       21.2  %    20 - 24  LV:        4.2  mm  Est. FW:    1245   gm    2 lb 12 oz     47  % ---------------------------------------------------------------------- OB History  Gravidity:    4         Term:   3        Prem:   0        SAB:   0  TOP:          0       Ectopic:  0        Living: 3 ---------------------------------------------------------------------- Gestational Age  LMP:           28w 2d        Date:  08/10/23                   EDD:   05/16/24  U/S Today:  28w 3d                                        EDD:   05/15/24  Best:          28w 2d     Det. By:  LMP  (08/10/23)          EDD:   05/16/24 ---------------------------------------------------------------------- Targeted Anatomy  Central Nervous System  Calvarium/Cranial V.:  Previously seen        Cereb./Vermis:          Previously seen  Cavum:                 Previously seen        [place] [employer]:         Previously seen  Lateral Ventricles:    Appears normal         Midline Falx:           Previously seen  Choroid Plexus:        Previously seen  Spine  Cervical:              Previously seen        Sacral:                 Previously seen  Thoracic:              Previously seen        Shape/Curvature:        Previously seen  Lumbar:                Previously seen  Head/Neck  Lips:                  Previously seen        Profile:                Previously seen  Neck:                  Previously seen        Orbits/Eyes:             Previously seen  Nuchal Fold:           Previously seen        Mandible:               Appears normal  Nasal Bone:            Previously seen        Maxilla:                Appears normal  Thorax  4 Chamber View:        Appears normal         Interventr. Septum:     Previously seen  Cardiac Rhythm:        Normal                 Cardiac Axis:           Previously seen  Cardiac Situs:         Previously seen        Diaphragm:              Previously seen  Rt Outflow Tract:      Previously seen        3 Vessel View:  Previously seen  Lt Outflow Tract:      Previously seen        3 V Trachea View:       Previously seen  Aortic Arch:           Previously seen        IVC:                    Appears normal  Ductal Arch:           Appears normal         Crossing:               Appears normal  SVC:                   Appears normal  Abdomen  Ventral Wall:          Previously seen        Lt Kidney:              Appears normal  Cord Insertion:        Previously seen        Rt Kidney:              Appears normal  Situs:                 Previously seen        Bladder:                Appears normal  Stomach:               Appears normal  Extremities  Lt Humerus:            Previously seen        Lt Femur:               Previously seen  Rt Humerus:            Previously seen        Rt Femur:               Previously seen  Lt Forearm:            Previously seen        Lt Lower Leg:           Previously seen  Rt Forearm:            Previously seen        Rt Lower Leg:           Previously seen  Lt Hand:               Previously seen        Lt Foot:                Previously seen  Rt Hand:               Open hand nml          Rt Foot:                Previously seen  Other  Umbilical Cord:        Previously seen        Genitalia:              Previously seen ---------------------------------------------------------------------- Cervix Uterus Adnexa  Cervix  Length:            4.4  cm.  Normal appearance by  transabdominal scan  Uterus  No  abnormality visualized.  Right Ovary  Not visualized.  Left Ovary  Not visualized.  Cul De Sac  No free fluid seen.  Adnexa  No abnormality visualized. ---------------------------------------------------------------------- Comments  [name] is currently at 28 weeks and 2 days.  She has  been followed due to advanced maternal age (43 years old)  and maternal obesity with a BMI of 40.61.  She has a history of a prior NSTEMI about 3 years ago.  She  has not been seen by cardiology in over a year.  She is not  taking any antihypertensive medications at this time.  She denies any problems since her last exam.  Sonographic findings  Single intrauterine pregnancy at 28w 2d.  Fetal cardiac activity:  Observed and appears normal.  Presentation: Cephalic.  Interval fetal anatomy appears normal.  Fetal biometry shows the estimated fetal weight of 2 pounds  12 ounces which measuresat the 47th percentile.  Amniotic fluid volume: Within normal limits. MVP: 3.93 cm.  Placenta: Anterior.  Fetal movements and fetal breathing movements were noted  during today's exam.  There are limitations of prenatal ultrasound such as the  inability to detect certain abnormalities due to poor  visualization. Various factors such as fetal position,  gestational age and maternal body habitus may increase the  difficulty in visualizing the fetal anatomy.  History of NSTEMI in pregnancy  I received a message from the cardio-obstetrics team  reporting that they have tried to contact her 3 times to  schedule an appointment.  They have not been unable to  reach her.  I encouraged the patient to make an appointment with the  cardio obstetrics team today.  Dr. Sheena from cardiology will make a determination to  determine if she needs any treatment during her pregnancy.  Due to maternal obesity and her age, we will start weekly  fetal testing at 32 weeks.  A follow-up growth scan and BPP was scheduled in 4 weeks.  The  patient stated that all of her questions were answered  today.  A total of 20 minutes was spent counseling and coordinating  the care for this patient.  Greater than 50% of the time was  spent in direct face-to-face contact. ----------------------------------------------------------------------                   Steffan Keys, MD Electronically Signed Final Report   02/25/2024 12:08 am ----------------------------------------------------------------------   Assessment and Plan: Patient Active Problem List   Diagnosis Date Noted   UTI in pregnancy 03/23/2024   Chronic hypertension affecting pregnancy 03/23/2024   Obesity affecting pregnancy, antepartum 01/16/2024   History of 3 cesarean sections 01/16/2024   AMA (advanced maternal age) multigravida 35+ 01/16/2024   CAD (coronary artery disease) 01/11/2022   Hypertension 01/11/2022   Tobacco abuse 01/11/2022   NSTEMI (non-ST elevated myocardial infarction) (HCC) 01/09/2022   Chronic HTN - suspect this is an exacerbation of untreated cHTN rather than SIPE at this time - continue procardia XL 60mg  daily, titrate as needed  -- if additional doses of rapid acting antihypertensives are needed, low threshold for magnesium for seizure ppx - f/u P/C; daily labs, monitor plt (110 on admission) - ldASA qhs  2. Hx NSTEMI - ldASA - baseline EKG ordered for AM - will coordinate appt w/ Dr. Sheena prior to discharge  3. FWB/Routine - dNST - Plan for BMZ & NICU consult if magnesium is started - Will try to get 1h prior to admin of BMZ - Plans to  f/w [facility] on discharge  Dispo: admit to antenatal  Kieth Carolin, MD Obstetrician & Gynecologist, Faculty Practice [facility], [facility]

## 2024-03-24 DIAGNOSIS — D696 Thrombocytopenia, unspecified: Secondary | ICD-10-CM | POA: Diagnosis present

## 2024-03-24 DIAGNOSIS — O119 Pre-existing hypertension with pre-eclampsia, unspecified trimester: Secondary | ICD-10-CM | POA: Diagnosis present

## 2024-03-24 LAB — COMPREHENSIVE METABOLIC PANEL WITH GFR
ALT: 12 U/L (ref 0–44)
AST: 16 U/L (ref 15–41)
Albumin: 2.4 g/dL — ABNORMAL LOW (ref 3.5–5.0)
Alkaline Phosphatase: 95 U/L (ref 38–126)
Anion gap: 11 (ref 5–15)
BUN: 8 mg/dL (ref 6–20)
CO2: 18 mmol/L — ABNORMAL LOW (ref 22–32)
Calcium: 8.6 mg/dL — ABNORMAL LOW (ref 8.9–10.3)
Chloride: 104 mmol/L (ref 98–111)
Creatinine, Ser: 0.73 mg/dL (ref 0.44–1.00)
GFR, Estimated: >60 mL/min (ref >60)
Glucose, Bld: 68 mg/dL — ABNORMAL LOW (ref 70–99)
Potassium: 4.3 mmol/L (ref 3.5–5.1)
Sodium: 133 mmol/L — ABNORMAL LOW (ref 135–145)
Total Bilirubin: 0.3 mg/dL (ref 0.0–1.2)
Total Protein: 5.5 g/dL — ABNORMAL LOW (ref 6.5–8.1)

## 2024-03-24 LAB — CBC
HCT: 29.5 % — ABNORMAL LOW (ref 36.0–46.0)
Hemoglobin: 9.7 g/dL — ABNORMAL LOW (ref 12.0–15.0)
MCH: 30.9 pg (ref 26.0–34.0)
MCHC: 32.9 g/dL (ref 30.0–36.0)
MCV: 93.9 fL (ref 80.0–100.0)
Platelets: 100 K/uL — ABNORMAL LOW (ref 150–400)
RBC: 3.14 MIL/uL — ABNORMAL LOW (ref 3.87–5.11)
RDW: 13.2 % (ref 11.5–15.5)
WBC: 7.2 K/uL (ref 4.0–10.5)
nRBC: 0 % (ref 0.0–0.2)

## 2024-03-24 LAB — RPR: RPR Ser Ql: NONREACTIVE

## 2024-03-24 MED ORDER — MAGNESIUM SULFATE 40 GM/1000ML IV SOLN
2.0000 g/h | INTRAVENOUS | Status: AC
  Administered 2024-03-24 – 2024-03-25 (×2): 2 g/h via INTRAVENOUS
  Filled 2024-03-24 (×2): qty 1000

## 2024-03-24 MED ORDER — LACTATED RINGERS IV SOLN: INTRAVENOUS | Status: AC

## 2024-03-24 MED ORDER — MAGNESIUM SULFATE BOLUS VIA INFUSION
4.0000 g | Freq: Once | INTRAVENOUS | Status: AC
  Administered 2024-03-24: 4 g via INTRAVENOUS
  Filled 2024-03-24: qty 1000

## 2024-03-24 MED ORDER — BETAMETHASONE SOD PHOS & ACET 6 (3-3) MG/ML IJ SUSP
12.0000 mg | INTRAMUSCULAR | Status: AC
  Administered 2024-03-24 – 2024-03-25 (×2): 12 mg via INTRAMUSCULAR
  Filled 2024-03-24: qty 5

## 2024-03-24 NOTE — Progress Notes (Signed)
 FACULTY PRACTICE ANTEPARTUM COMPREHENSIVE PROGRESS NOTE  [name] is a 43 y.o. [phone] at [redacted]w[redacted]d who is admitted for [facility] with SIPE.  Estimated Date of Delivery: 05/16/24 Fetal presentation is cephalic.  Length of Stay:  1 Days. Admitted 03/23/2024  Subjective:  No HA/BV/RUQ pain.  Patient reports good fetal movement.  She reports no uterine contractions, no bleeding and no loss of fluid per vagina.  Vitals:  Blood pressure (!) 108/57, pulse 64, temperature 98.5 F (36.9 C), temperature source Oral, resp. rate 16, height 5' 7 (1.702 m), weight 125.7 kg, last menstrual period 08/10/2023, SpO2 98%. Physical Examination: CONSTITUTIONAL: Well-developed, well-nourished female in no acute distress.  NEUROLOGIC: Alert and oriented to person, place, and time. No cranial nerve deficit noted. PSYCHIATRIC: Normal mood and affect. Normal behavior. Normal judgment and thought content. CARDIOVASCULAR: Normal heart rate noted, regular rhythm RESPIRATORY: Effort and breath sounds normal, no problems with respiration noted MUSCULOSKELETAL: Normal range of motion. No edema and no tenderness. 2+ distal pulses. ABDOMEN: Soft, nontender, nondistended, gravid. CERVIX:  Not examined  Fetal monitoring: FHR: 120 bpm, Variability: moderate, Accelerations: Present, Decelerations: Absent  Uterine activity: None  Results for orders placed or performed during the hospital encounter of 03/23/24 (from the past 48 hours)  Urinalysis, Routine w reflex microscopic -Urine, Clean Catch     Status: Abnormal   Collection Time: 03/23/24  4:03 PM  Result Value Ref Range   Color, Urine YELLOW YELLOW   APPearance HAZY (A) CLEAR   Specific Gravity, Urine 1.016 1.005 - 1.030   pH 6.0 5.0 - 8.0   Glucose, UA NEGATIVE NEGATIVE mg/dL   Hgb urine dipstick NEGATIVE NEGATIVE   Bilirubin Urine NEGATIVE NEGATIVE   Ketones, ur NEGATIVE NEGATIVE mg/dL   Protein, ur 899 (A) NEGATIVE mg/dL   Nitrite NEGATIVE NEGATIVE    Leukocytes,Ua NEGATIVE NEGATIVE   RBC / HPF 0-5 0 - 5 RBC/hpf   WBC, UA 6-10 0 - 5 WBC/hpf   Bacteria, UA RARE (A) NONE SEEN   Squamous Epithelial / HPF 0-5 0 - 5 /HPF   Mucus PRESENT     Comment: Performed at [facility], [address] [place], [place] [address]  Hepatitis B surface antigen     Status: None   Collection Time: 03/23/24  4:14 PM  Result Value Ref Range   Hepatitis B Surface Ag NON REACTIVE NON REACTIVE    Comment: Performed at [facility], [address] [place], [place] [address]  RPR     Status: None   Collection Time: 03/23/24  4:14 PM  Result Value Ref Range   RPR Ser Ql NON REACTIVE NON REACTIVE    Comment: Performed at [facility], [address], [place], [place] [address]  CBC     Status: Abnormal   Collection Time: 03/23/24  4:14 PM  Result Value Ref Range   WBC 7.9 4.0 - 10.5 K/uL   RBC 3.81 (L) 3.87 - 5.11 MIL/uL   Hemoglobin 11.7 (L) 12.0 - 15.0 g/dL   HCT 63.5 63.9 - 53.9 %   MCV 95.5 80.0 - 100.0 fL   MCH 30.7 26.0 - 34.0 pg   MCHC 32.1 30.0 - 36.0 g/dL   RDW 86.7 88.4 - 84.4 %   Platelets 110 (L) 150 - 400 K/uL   nRBC 0.0 0.0 - 0.2 %    Comment: Performed at [facility], [address], [place], [place] [address]  Differential  Status: None   Collection Time: 03/23/24  4:14 PM  Result Value Ref Range   Neutrophils Relative % 60 %   Neutro Abs 4.9 1.7 - 7.7 K/uL   Lymphocytes Relative 29 %   Lymphs Abs 2.3 0.7 - 4.0 K/uL   Monocytes Relative 8 %   Monocytes Absolute 0.6 0.1 - 1.0 K/uL   Eosinophils Relative 1 %   Eosinophils Absolute 0.1 0.0 - 0.5 K/uL   Basophils Relative 1 %   Basophils Absolute 0.0 0.0 - 0.1 K/uL   Immature Granulocytes 1 %   Abs Immature Granulocytes 0.04 0.00 - 0.07 K/uL    Comment: Performed at [facility], [address], [place], [place] [address]  Type and screen [facility]     Status: None   Collection Time: 03/23/24  4:14 PM  Result Value Ref Range    ABO/RH(D) O POS    Antibody Screen NEG    Sample Expiration      03/26/2024,2359 Performed at [facility], [address], [place], [place] [address]   HIV Antibody (routine testing w rflx)     Status: None   Collection Time: 03/23/24  4:14 PM  Result Value Ref Range   HIV Screen 4th Generation wRfx Non Reactive Non Reactive    Comment: Performed at [facility], [address] [place], [place] [address]  Hemoglobin A1c     Status: Abnormal   Collection Time: 03/23/24  4:14 PM  Result Value Ref Range   Hgb A1c MFr Bld 4.6 (L) 4.8 - 5.6 %    Comment: (NOTE) Diagnosis of Diabetes The following HbA1c ranges recommended by the American Diabetes Association (ADA) may be used as an aid in the diagnosis of diabetes mellitus.  Hemoglobin             Suggested A1C NGSP%              Diagnosis  <5.7                   Non Diabetic  5.7-6.4                Pre-Diabetic  >6.4                   Diabetic  <7.0                   Glycemic control for                       adults with diabetes.     Mean Plasma Glucose 85.32 mg/dL    Comment: Performed at [facility], [address], [place], [place] [address]  Comprehensive metabolic panel     Status: Abnormal   Collection Time: 03/23/24  4:14 PM  Result Value Ref Range   Sodium 135 135 - 145 mmol/L   Potassium 4.2 3.5 - 5.1 mmol/L   Chloride 105 98 - 111 mmol/L   CO2 19 (L) 22 - 32 mmol/L   Glucose, Bld 66 (L) 70 - 99 mg/dL    Comment: Glucose reference range applies only to samples taken after fasting for at least 8 hours.   BUN 10 6 - 20 mg/dL   Creatinine, Ser 9.20 0.44 - 1.00 mg/dL   Calcium  8.8 (L) 8.9 - 10.3 mg/dL   Total Protein 6.7 6.5 - 8.1 g/dL   Albumin 2.8 (L) 3.5 -  5.0 g/dL   AST 21 15 - 41 U/L   ALT 12 0 - 44 U/L   Alkaline Phosphatase 109 38 - 126 U/L   Total Bilirubin 0.3 0.0 - 1.2 mg/dL   GFR, Estimated >39 >39 mL/min    Comment: (NOTE) Calculated using the CKD-EPI Creatinine Equation  (2021)    Anion gap 11 5 - 15    Comment: Performed at [facility], [address], [place], [place] [address]  Wet prep, genital     Status: Abnormal   Collection Time: 03/23/24  4:52 PM  Result Value Ref Range   Yeast Wet Prep HPF POC NONE SEEN NONE SEEN   Trich, Wet Prep NONE SEEN NONE SEEN   Clue Cells Wet Prep HPF POC PRESENT (A) NONE SEEN   WBC, Wet Prep HPF POC <10 <10   Sperm NONE SEEN     Comment: Performed at [facility], [address] [place], [place] [address]  Protein / creatinine ratio, urine     Status: Abnormal   Collection Time: 03/23/24  5:54 PM  Result Value Ref Range   Creatinine, Urine 139 mg/dL   Total Protein, Urine 157 mg/dL    Comment: RESULT CONFIRMED BY MANUAL DILUTION NO NORMAL RANGE ESTABLISHED FOR THIS TEST    Protein Creatinine Ratio 1.13 (H) 0.00 - 0.15 mg/mg[Cre]    Comment: Performed at [facility], [address], [place], [place] [address]  Comprehensive metabolic panel     Status: Abnormal   Collection Time: 03/24/24  4:27 AM  Result Value Ref Range   Sodium 133 (L) 135 - 145 mmol/L   Potassium 4.3 3.5 - 5.1 mmol/L   Chloride 104 98 - 111 mmol/L   CO2 18 (L) 22 - 32 mmol/L   Glucose, Bld 68 (L) 70 - 99 mg/dL    Comment: Glucose reference range applies only to samples taken after fasting for at least 8 hours.   BUN 8 6 - 20 mg/dL   Creatinine, Ser 9.26 0.44 - 1.00 mg/dL   Calcium  8.6 (L) 8.9 - 10.3 mg/dL   Total Protein 5.5 (L) 6.5 - 8.1 g/dL   Albumin 2.4 (L) 3.5 - 5.0 g/dL   AST 16 15 - 41 U/L   ALT 12 0 - 44 U/L   Alkaline Phosphatase 95 38 - 126 U/L   Total Bilirubin 0.3 0.0 - 1.2 mg/dL   GFR, Estimated >39 >39 mL/min    Comment: (NOTE) Calculated using the CKD-EPI Creatinine Equation (2021)    Anion gap 11 5 - 15    Comment: Performed at [facility], [address] [place], [place] [address]  CBC     Status: Abnormal   Collection Time: 03/24/24  4:27 AM  Result Value Ref Range   WBC 7.2 4.0 -  10.5 K/uL   RBC 3.14 (L) 3.87 - 5.11 MIL/uL   Hemoglobin 9.7 (L) 12.0 - 15.0 g/dL   HCT 70.4 (L) 63.9 - 53.9 %   MCV 93.9 80.0 - 100.0 fL   MCH 30.9 26.0 - 34.0 pg   MCHC 32.9 30.0 - 36.0 g/dL   RDW 86.7 88.4 - 84.4 %   Platelets 100 (L) 150 - 400 K/uL   nRBC 0.0 0.0 - 0.2 %    Comment: Performed at [facility] Lab, [address], [place], [place] [address]    US  MFM OB FOLLOW UP Result Date: 03/23/2024 ----------------------------------------------------------------------  OBSTETRICS REPORT                       (  Signed Final 03/23/2024 04:01 pm) ---------------------------------------------------------------------- Patient Info  ID #:       [id number]                          D.O.B.:  1980-11-04 (43 yrs)(F)  Name:       [name]                Visit Date: 03/23/2024 01:12 pm ---------------------------------------------------------------------- Performed By  Attending:        Fredia Fresh MD        Ref. Address:     [address]                                                             [address]                                                             [place]                                                             [address]  Performed By:     Jonette Nap        [facility]                                  [facility]  Referred By:      Marlis Leash                    OB GYN ---------------------------------------------------------------------- Orders  #  Description                           Code        Ordered By  1  US  MFM OB FOLLOW UP  23183.98    YU FANG  2  US  MFM FETAL BPP WO NON               76819.01    YU FANG     STRESS  3  US  MFM UA CORD DOPPLER                76820.02    YU FANG ----------------------------------------------------------------------  #   Order #                     Accession #                Episode #  1  [id number]                   [id number]                 [id number]  2  [id number]                   [id number]                 [id number]  3  [id number]                   [id number]                 [id number] ---------------------------------------------------------------------- Indications  Advanced maternal age multigravida 74+,        O33.523  third trimester  Elevated blood pressure affecting pregnancy    O13.3  in third trimester  Obesity complicating pregnancy (BMI 40)        O99.210 E66.9  Tobacco use complicating pregnancy, third      O99.333  trimester  Medical complication of pregnancy (NSTEMI)     O26.90  Previous cesarean delivery, antepartum x 3     O34.219  Encounter for other antenatal screening        Z36.2  follow-up  [redacted] weeks gestation of pregnancy                Z3A.32 ---------------------------------------------------------------------- Fetal Evaluation  Num Of Fetuses:         1  Fetal Heart Rate(bpm):  132  Cardiac Activity:       Observed  Presentation:           Cephalic  Placenta:               Anterior  P. Cord Insertion:      Previously seen  Amniotic Fluid  AFI FV:      Within normal limits  AFI Sum(cm)     %Tile       Largest Pocket(cm)  13.17           41          4.42  RUQ(cm)       RLQ(cm)       LUQ(cm)        LLQ(cm)  4.42          2.18          3.41           3.16 ---------------------------------------------------------------------- Biophysical Evaluation  Amniotic F.V:   Pocket => 2 cm             F. Tone:        Observed  F. Movement:    Observed  Score:          6/8  F. Breathing:   Not Observed ---------------------------------------------------------------------- Biometry  BPD:      76.6  mm     G. Age:  30w 5d          7  %    CI:         73.9   %    70 - 86                                                          FL/HC:      20.2   %    19.1 - 21.3  HC:       283   mm     G. Age:  31w 0d         2.2  %    HC/AC:      1.03        0.96 - 1.17  AC:      275.8  mm     G. Age:  31w 5d         30  %    FL/BPD:     74.7   %    71 - 87  FL:       57.2  mm     G. Age:  30w 0d        2.1  %    FL/AC:      20.7   %    20 - 24  Est. FW:    1680  gm    3 lb 11 oz      10  % ---------------------------------------------------------------------- OB History  Gravidity:    4         Term:   3        Prem:   0        SAB:   0  TOP:          0       Ectopic:  0        Living: 3 ---------------------------------------------------------------------- Gestational Age  LMP:           32w 2d        Date:  08/10/23                   EDD:   05/16/24  U/S Today:     30w 6d                                        EDD:   05/26/24  Best:          bobbye 2d     Det. By:  LMP  (08/10/23)          EDD:   05/16/24 ---------------------------------------------------------------------- Anatomy  Cranium:               Previously seen        Aortic Arch:            Previously seen  Cavum:                 Previously seen        Ductal Arch:  Previously seen  Ventricles:            Appears normal         Diaphragm:              Appears normal  Choroid Plexus:        Previously seen        Stomach:                Appears normal, left                                                                        sided  Cerebellum:            Previously seen        Abdomen:                Previously seen  Posterior Fossa:       Previously seen        Abdominal Wall:         Previously seen  Face:                  Orbits and profile     Cord Vessels:           Previously seen                         previously seen  Lips:                  Previously seen        Kidneys:                Appear normal  Thoracic:              Previously seen        Bladder:                Appears normal  Heart:                 Appears normal         Spine:                  Previously seen                         (4CH, axis, and                         situs)  RVOT:                   Previously seen        Upper Extremities:      Previously seen  LVOT:                  Previously seen        Lower Extremities:      Previously seen  Other:  Fetal anatomic survey complete on prior scans. ---------------------------------------------------------------------- Doppler - Fetal Vessels  Umbilical Artery   S/D     %tile      RI    %tile      PI    %tile  PSV    ADFV    RDFV                                                     (cm/s)   2.67       51    0.63       60    0.94       59    41.67      No      No ---------------------------------------------------------------------- Impression  Patient is here for fetal growth assessment and antenatal  testing.  She was discharged from [facility] practice after  several attempts to communicate with her failed. She has not  had prenatal care for about 2 months.  She has not had  screening for gestational diabetes.  Past medical history is significant for NSTEMI in August  2023.  Patient recovered well and had normal ejection  fraction on echocardiography.  She does not have any  cardiovascular symptoms including shortness of breath or  chest pain now.  Blood pressures today at our office were 162/87 and repeat  139/94 mmHg.  Obstetrical history is significant for 3 term vaginal deliveries.  Ultrasound  The estimated fetal weight is at the 10th percentile and the  abdominal circumference measurement at the 30th  percentile.  Normal amniotic fluid.  Fetal breathing  movements did not meet the criteria BPP.  Umbilical artery  Doppler showed normal forward diastolic flow.  BPP 6/8.  History of myocardial infarction  Patient likely to be in modified WHO Class III because of a  history of myocardial infarction.  She has a high risk for  significant maternal morbidity.  She had missed her cardio  obstetric clinic appointments.  If echocardiographic  abnormalities are present, she may be in [facility] Class IV.  She is at high risk for cardiac failure and arrhythmias.  I  counseled the patient that she needs to be evaluated by our  cardiologist.  Hypertension in pregnancy  I discussed the possibility of gestational hypertension  complicating her pregnancy.  I discussed the possible  complications including preeclampsia, maternal  complications including pulmonary edema renal failure and  coagulation disturbances.  I recommended that the patient be evaluated at the MAU with  series of blood pressures and if possible cardiology  consultation.  I discussed with Dr. Cleatus, who will evaluate the patient in  the hospital. ---------------------------------------------------------------------- Recommendations  -Evaluation at the MAU with possible admission and  cardiology consultation.  - Continue weekly antenatal testing till delivery. ----------------------------------------------------------------------                  Fredia Fresh, MD Electronically Signed Final Report   03/23/2024 04:01 pm ----------------------------------------------------------------------   US  MFM FETAL BPP WO NON STRESS Result Date: 03/23/2024 ----------------------------------------------------------------------  OBSTETRICS REPORT                       (Signed Final 03/23/2024 04:01 pm) ---------------------------------------------------------------------- Patient Info  ID #:       [id number]                          D.O.B.:  March 08, 1981 (43 yrs)(F)  Name:       [name]  Visit Date: 03/23/2024 01:12 pm ---------------------------------------------------------------------- Performed By  Attending:        Fredia Fresh MD        Ref. Address:     [address]                                                             [address]                                                             [place]                                                             [address]  Performed By:     Jonette Nap        [facility]                                   [facility]  Referred By:      [facility] ---------------------------------------------------------------------- Orders  #  Description                           Code        Ordered By  1  US  MFM OB FOLLOW UP                   76816.01    YU FANG  2  US  MFM FETAL BPP WO NON               76819.01    YU FANG     STRESS  3  US  MFM UA CORD DOPPLER                23179.97    YU FANG ----------------------------------------------------------------------  #  Order #                     Accession #                Episode #  1  [id number]                   [id number]                 [id number]  2  [id number]                   [id number]                 [id number]  3  [id number]                   [id number]                 [id number] ---------------------------------------------------------------------- Indications  Advanced maternal age multigravida 2+,        O42.523  third trimester  Elevated blood pressure affecting pregnancy    O13.3  in third trimester  Obesity complicating pregnancy (BMI 40)        O99.210 E66.9  Tobacco use complicating pregnancy, third      O99.333  trimester  Medical complication of pregnancy (NSTEMI)     O26.90  Previous cesarean delivery, antepartum x 3     O34.219  Encounter for other antenatal screening        Z36.2  follow-up  [redacted] weeks gestation of pregnancy                Z3A.32 ---------------------------------------------------------------------- Fetal Evaluation  Num Of Fetuses:         1  Fetal Heart Rate(bpm):  132  Cardiac Activity:       Observed  Presentation:           Cephalic  Placenta:               Anterior  P. Cord Insertion:      Previously seen  Amniotic Fluid  AFI FV:      Within normal limits  AFI Sum(cm)     %Tile       Largest Pocket(cm)  13.17           41          4.42  RUQ(cm)       RLQ(cm)        LUQ(cm)        LLQ(cm)  4.42          2.18          3.41           3.16 ---------------------------------------------------------------------- Biophysical Evaluation  Amniotic F.V:   Pocket => 2 cm             F. Tone:        Observed  F. Movement:    Observed                   Score:          6/8  F. Breathing:   Not Observed ---------------------------------------------------------------------- Biometry  BPD:      76.6  mm     G. Age:  30w 5d          7  %    CI:         73.9   %    70 - 86  FL/HC:      20.2   %    19.1 - 21.3  HC:       283   mm     G. Age:  31w 0d        2.2  %    HC/AC:      1.03        0.96 - 1.17  AC:      275.8  mm     G. Age:  31w 5d         30  %    FL/BPD:     74.7   %    71 - 87  FL:       57.2  mm     G. Age:  30w 0d        2.1  %    FL/AC:      20.7   %    20 - 24  Est. FW:    1680  gm    3 lb 11 oz      10  % ---------------------------------------------------------------------- OB History  Gravidity:    4         Term:   3        Prem:   0        SAB:   0  TOP:          0       Ectopic:  0        Living: 3 ---------------------------------------------------------------------- Gestational Age  LMP:           32w 2d        Date:  08/10/23                   EDD:   05/16/24  U/S Today:     30w 6d                                        EDD:   05/26/24  Best:          bobbye 2d     Det. By:  LMP  (08/10/23)          EDD:   05/16/24 ---------------------------------------------------------------------- Anatomy  Cranium:               Previously seen        Aortic Arch:            Previously seen  Cavum:                 Previously seen        Ductal Arch:            Previously seen  Ventricles:            Appears normal         Diaphragm:              Appears normal  Choroid Plexus:        Previously seen        Stomach:                Appears normal, left  sided   Cerebellum:            Previously seen        Abdomen:                Previously seen  Posterior Fossa:       Previously seen        Abdominal Wall:         Previously seen  Face:                  Orbits and profile     Cord Vessels:           Previously seen                         previously seen  Lips:                  Previously seen        Kidneys:                Appear normal  Thoracic:              Previously seen        Bladder:                Appears normal  Heart:                 Appears normal         Spine:                  Previously seen                         (4CH, axis, and                         situs)  RVOT:                  Previously seen        Upper Extremities:      Previously seen  LVOT:                  Previously seen        Lower Extremities:      Previously seen  Other:  Fetal anatomic survey complete on prior scans. ---------------------------------------------------------------------- Doppler - Fetal Vessels  Umbilical Artery   S/D     %tile      RI    %tile      PI    %tile     PSV    ADFV    RDFV                                                     (cm/s)   2.67       51    0.63       60    0.94       59    41.67      No      No ---------------------------------------------------------------------- Impression  Patient is here for fetal growth assessment and antenatal  testing.  She was discharged from [facility] practice after  several attempts to communicate with her failed. She has not  had prenatal care for about 2  months.  She has not had  screening for gestational diabetes.  Past medical history is significant for NSTEMI in August  2023.  Patient recovered well and had normal ejection  fraction on echocardiography.  She does not have any  cardiovascular symptoms including shortness of breath or  chest pain now.  Blood pressures today at our office were 162/87 and repeat  139/94 mmHg.  Obstetrical history is significant for 3 term vaginal deliveries.  Ultrasound  The estimated fetal  weight is at the 10th percentile and the  abdominal circumference measurement at the 30th  percentile.  Normal amniotic fluid.  Fetal breathing  movements did not meet the criteria BPP.  Umbilical artery  Doppler showed normal forward diastolic flow.  BPP 6/8.  History of myocardial infarction  Patient likely to be in modified WHO Class III because of a  history of myocardial infarction.  She has a high risk for  significant maternal morbidity.  She had missed her cardio  obstetric clinic appointments.  If echocardiographic  abnormalities are present, she may be in [facility] Class IV.  She is at high risk for cardiac failure and arrhythmias.  I counseled the patient that she needs to be evaluated by our  cardiologist.  Hypertension in pregnancy  I discussed the possibility of gestational hypertension  complicating her pregnancy.  I discussed the possible  complications including preeclampsia, maternal  complications including pulmonary edema renal failure and  coagulation disturbances.  I recommended that the patient be evaluated at the MAU with  series of blood pressures and if possible cardiology  consultation.  I discussed with Dr. Cleatus, who will evaluate the patient in  the hospital. ---------------------------------------------------------------------- Recommendations  -Evaluation at the MAU with possible admission and  cardiology consultation.  - Continue weekly antenatal testing till delivery. ----------------------------------------------------------------------                  Fredia Fresh, MD Electronically Signed Final Report   03/23/2024 04:01 pm ----------------------------------------------------------------------   US  MFM UA CORD DOPPLER Result Date: 03/23/2024 ----------------------------------------------------------------------  OBSTETRICS REPORT                       (Signed Final 03/23/2024 04:01 pm) ---------------------------------------------------------------------- Patient Info  ID #:        [id number]                          D.O.B.:  05-Jan-1981 (43 yrs)(F)  Name:       [name]                Visit Date: 03/23/2024 01:12 pm ---------------------------------------------------------------------- Performed By  Attending:        Fredia Fresh MD        Ref. Address:     [address]                                                             [address]  [place]                                                             [address]  Performed By:     Jonette Nap        [facility]  Referred By:      [facility] ---------------------------------------------------------------------- Orders  #  Description                           Code        Ordered By  1  US  MFM OB FOLLOW UP                   76816.01    YU FANG  2  US  MFM FETAL BPP WO NON               76819.01    YU FANG     STRESS  3  US  MFM UA CORD DOPPLER                76820.02    YU FANG ----------------------------------------------------------------------  #  Order #                     Accession #                Episode #  1  [id number]                   [id number]  2  [id number]                   [id number]                 [id number]  3  [id number]                   [id number]  [id number] ---------------------------------------------------------------------- Indications  Advanced maternal age multigravida 55+,        O64.523  third trimester  Elevated blood pressure affecting pregnancy    O13.3  in third trimester  Obesity complicating pregnancy (BMI 40)        O99.210 E66.9  Tobacco use complicating pregnancy, third      O99.333  trimester  Medical complication of pregnancy  (NSTEMI)     O26.90  Previous cesarean delivery, antepartum x 3     O34.219  Encounter for other antenatal screening        Z36.2  follow-up  [redacted] weeks gestation of pregnancy                Z3A.32 ---------------------------------------------------------------------- Fetal Evaluation  Num Of Fetuses:         1  Fetal Heart Rate(bpm):  132  Cardiac Activity:       Observed  Presentation:           Cephalic  Placenta:               Anterior  P. Cord Insertion:      Previously seen  Amniotic Fluid  AFI FV:      Within normal limits  AFI Sum(cm)     %Tile       Largest Pocket(cm)  13.17           41          4.42  RUQ(cm)       RLQ(cm)       LUQ(cm)        LLQ(cm)  4.42          2.18          3.41           3.16 ---------------------------------------------------------------------- Biophysical Evaluation  Amniotic F.V:   Pocket => 2 cm             F. Tone:        Observed  F. Movement:    Observed                   Score:          6/8  F. Breathing:   Not Observed ---------------------------------------------------------------------- Biometry  BPD:      76.6  mm     G. Age:  30w 5d          7  %    CI:         73.9   %    70 - 86                                                          FL/HC:      20.2   %    19.1 - 21.3  HC:       283   mm     G. Age:  31w 0d        2.2  %    HC/AC:      1.03        0.96 - 1.17  AC:      275.8  mm     G. Age:  31w 5d         30  %    FL/BPD:  74.7   %    71 - 87  FL:       57.2  mm     G. Age:  30w 0d        2.1  %    FL/AC:      20.7   %    20 - 24  Est. FW:    1680  gm    3 lb 11 oz      10  % ---------------------------------------------------------------------- OB History  Gravidity:    4         Term:   3        Prem:   0        SAB:   0  TOP:          0       Ectopic:  0        Living: 3 ---------------------------------------------------------------------- Gestational Age  LMP:           32w 2d        Date:  08/10/23                   EDD:   05/16/24  U/S Today:     30w 6d                                         EDD:   05/26/24  Best:          bobbye 2d     Det. By:  LMP  (08/10/23)          EDD:   05/16/24 ---------------------------------------------------------------------- Anatomy  Cranium:               Previously seen        Aortic Arch:            Previously seen  Cavum:                 Previously seen        Ductal Arch:            Previously seen  Ventricles:            Appears normal         Diaphragm:              Appears normal  Choroid Plexus:        Previously seen        Stomach:                Appears normal, left                                                                        sided  Cerebellum:            Previously seen        Abdomen:                Previously seen  Posterior Fossa:       Previously seen        Abdominal Wall:         Previously seen  Face:  Orbits and profile     Cord Vessels:           Previously seen                         previously seen  Lips:                  Previously seen        Kidneys:                Appear normal  Thoracic:              Previously seen        Bladder:                Appears normal  Heart:                 Appears normal         Spine:                  Previously seen                         (4CH, axis, and                         situs)  RVOT:                  Previously seen        Upper Extremities:      Previously seen  LVOT:                  Previously seen        Lower Extremities:      Previously seen  Other:  Fetal anatomic survey complete on prior scans. ---------------------------------------------------------------------- Doppler - Fetal Vessels  Umbilical Artery   S/D     %tile      RI    %tile      PI    %tile     PSV    ADFV    RDFV                                                     (cm/s)   2.67       51    0.63       60    0.94       59    41.67      No      No ---------------------------------------------------------------------- Impression  Patient is here for fetal growth assessment and  antenatal  testing.  She was discharged from [facility] practice after  several attempts to communicate with her failed. She has not  had prenatal care for about 2 months.  She has not had  screening for gestational diabetes.  Past medical history is significant for NSTEMI in August  2023.  Patient recovered well and had normal ejection  fraction on echocardiography.  She does not have any  cardiovascular symptoms including shortness of breath or  chest pain now.  Blood pressures today at our office were 162/87 and repeat  139/94 mmHg.  Obstetrical history is significant for 3 term vaginal deliveries.  Ultrasound  The estimated fetal weight is at the 10th percentile and  the  abdominal circumference measurement at the 30th  percentile.  Normal amniotic fluid.  Fetal breathing  movements did not meet the criteria BPP.  Umbilical artery  Doppler showed normal forward diastolic flow.  BPP 6/8.  History of myocardial infarction  Patient likely to be in modified WHO Class III because of a  history of myocardial infarction.  She has a high risk for  significant maternal morbidity.  She had missed her cardio  obstetric clinic appointments.  If echocardiographic  abnormalities are present, she may be in [facility] Class IV.  She is at high risk for cardiac failure and arrhythmias.  I counseled the patient that she needs to be evaluated by our  cardiologist.  Hypertension in pregnancy  I discussed the possibility of gestational hypertension  complicating her pregnancy.  I discussed the possible  complications including preeclampsia, maternal  complications including pulmonary edema renal failure and  coagulation disturbances.  I recommended that the patient be evaluated at the MAU with  series of blood pressures and if possible cardiology  consultation.  I discussed with Dr. Cleatus, who will evaluate the patient in  the hospital. ---------------------------------------------------------------------- Recommendations  -Evaluation at the  MAU with possible admission and  cardiology consultation.  - Continue weekly antenatal testing till delivery. ----------------------------------------------------------------------                  Fredia Fresh, MD Electronically Signed Final Report   03/23/2024 04:01 pm ----------------------------------------------------------------------    Current scheduled medications  aspirin  EC  162 mg Oral QHS   betamethasone acetate-betamethasone sodium phosphate  12 mg Intramuscular Q24 Hr x 2   docusate sodium  100 mg Oral Daily   magnesium  4 g Intravenous Once   NIFEdipine  60 mg Oral Daily   prenatal multivitamin  1 tablet Oral Q1200    I have reviewed the patient's current medications.  ASSESSMENT: Principal Problem:   Chronic hypertension affecting pregnancy Active Problems:   CAD (coronary artery disease)   Tobacco abuse   History of 3 cesarean sections   UTI in pregnancy   Gestational thrombocytopenia   Chronic hypertension with superimposed preeclampsia   PLAN: CHTN with SIPE with SF - Started on Procardia 60 XL yesterday.  - P/C ratio 1.1 c/w SIPE.  - Discussed inpatient until delivery with delivery indicated at 34 weeks. Reviewed standard of care to do Magnesium now and 24 hours after delivery for seizure prophylaxis.  - Discussed BMZ to accelerate lung maturity.  - Patient agreeable to plan.  - Reviewed circumstances that would warrant earlier delivery.   Gestational thrombocytopenia - Platelets 100 this morning. Were 110 yesterday. Will continue to follow. Expect some rise from BMZ. She had low platelets with her last delivery as well, but given SIPE, will watch closely.   CAD - EKG done but not yet released.  - Last Echo in 2023 was normal EF.  - Spoke with cardiology for her h/o NSTEMI and CAD. Agree with EKG. If EKG normal and patient asymptomatic, Echo not required at this time. Otherwise, agree with management of HTN as indicated by our obstetric standards.  -  Hold on ASA currently due to platelets - but if improving may continue.   Tobacco abuse - May use nicotine  patch if needed.   H/o 3 prior c-sections - 10/29 not available at this time which would be [redacted]w[redacted]d. Currently scheduled for 10/30.   UTI in pregnancy - Urine culture ordered. No symptoms currently.   Routine PNC -  No gtt. A1C was 4.6.  - Will offer TDAP.  - Plans breast and formula - Baby girl - Declines contraception.   Neonatal - NICU consult. Team notified.  - Continue NST daily - EFW 10%ile with normal dopplers - BPP twice weekly. BPP 8/8 yesterday so we will do Tu/F.    Continue routine antenatal care.   Vina Solian, MD, FACOG Obstetrician & Gynecologist, [facility] for [facility], [facility]

## 2024-03-24 NOTE — Consult Note (Signed)
 Neonatology Consult to Antenatal Patient:  I was asked by Dr. Erik to see this patient in order to provide antenatal counseling due to threatening preterm delivery.  [name] was admitted 03/24/2024 at [redacted]w[redacted]d weeks GA. She is currently not having active labor; membranes are intact. Fetal status is reassuring. She is getting BMZ, Mag.  Pregnancy is complicated by UTI, obesity, AMA, untreated CHTN, and h/o tob use, NSTEMI and CAD for which now on Procardia.  3 previous children via c-section are now young adults; healthy.  Most recent US  10/17 notable for Est. FW: 1680 gm 3 lb 11 oz 10 %, nl AFI, BPP 6/8, and normal dopplers. Hopeful expectant management until c-section at [redacted] weeks EGA.  I spoke with the patient. We discussed the worst case of delivery in the next 1-2 days, including usual DR management, possible respiratory complications and need for support, IV access, feedings (mother open to breast feeding for short period, which was encouraged, and she agrees to use of donor breast milk as a bridge), LOS, Mortality and Morbidity, and long term outcomes.  Usual questions were answered.  I/we would be glad to come back if there are more questions later.  Thank you for asking me to see this patient.  Alm MOTE Othella, MD Neonatologist 03/24/2024, 4:22 PM  The total length of face-to-face or floor/unit time for this encounter was 25 minutes. Counseling and/or coordination of care was 40 minutes of the above.

## 2024-03-25 DIAGNOSIS — Z3A32 32 weeks gestation of pregnancy: Secondary | ICD-10-CM

## 2024-03-25 LAB — CBC
HCT: 34.4 % — ABNORMAL LOW (ref 36.0–46.0)
Hemoglobin: 11.3 g/dL — ABNORMAL LOW (ref 12.0–15.0)
MCH: 31 pg (ref 26.0–34.0)
MCHC: 32.8 g/dL (ref 30.0–36.0)
MCV: 94.5 fL (ref 80.0–100.0)
Platelets: 113 K/uL — ABNORMAL LOW (ref 150–400)
RBC: 3.64 MIL/uL — ABNORMAL LOW (ref 3.87–5.11)
RDW: 12.8 % (ref 11.5–15.5)
WBC: 14.6 K/uL — ABNORMAL HIGH (ref 4.0–10.5)
nRBC: 0 % (ref 0.0–0.2)

## 2024-03-25 LAB — COMPREHENSIVE METABOLIC PANEL WITH GFR
ALT: 12 U/L (ref 0–44)
AST: 18 U/L (ref 15–41)
Albumin: 2.8 g/dL — ABNORMAL LOW (ref 3.5–5.0)
Alkaline Phosphatase: 107 U/L (ref 38–126)
Anion gap: 10 (ref 5–15)
BUN: 8 mg/dL (ref 6–20)
CO2: 19 mmol/L — ABNORMAL LOW (ref 22–32)
Calcium: 8.2 mg/dL — ABNORMAL LOW (ref 8.9–10.3)
Chloride: 100 mmol/L (ref 98–111)
Creatinine, Ser: 0.79 mg/dL (ref 0.44–1.00)
GFR, Estimated: >60 mL/min (ref >60)
Glucose, Bld: 102 mg/dL — ABNORMAL HIGH (ref 70–99)
Potassium: 4 mmol/L (ref 3.5–5.1)
Sodium: 129 mmol/L — ABNORMAL LOW (ref 135–145)
Total Bilirubin: 0.4 mg/dL (ref 0.0–1.2)
Total Protein: 6.7 g/dL (ref 6.5–8.1)

## 2024-03-25 LAB — RUBELLA SCREEN: Rubella: 5.55 {index} (ref >0.99)

## 2024-03-25 NOTE — Progress Notes (Signed)
 FACULTY PRACTICE ANTEPARTUM COMPREHENSIVE PROGRESS NOTE  [name] is a 43 y.o. [phone] at [redacted]w[redacted]d who is admitted for [facility] with SIPE.  Estimated Date of Delivery: 05/16/24 Fetal presentation is cephalic.  Length of Stay:  2 Days. Admitted 03/23/2024  Subjective: Doing well this AM. Looking forward to mag being done. ,Denies HA, visual changes, CP/SOB, RUQ/epigastric pain Denies ctx, VB, LOF. +FM  Vitals:  Blood pressure (!) 149/79, pulse 88, temperature 97.6 F (36.4 C), temperature source Oral, resp. rate 18, height 5' 7 (1.702 m), weight 125.7 kg, last menstrual period 08/10/2023, SpO2 99%. Physical Examination: CONSTITUTIONAL: Well-developed, well-nourished female in no acute distress.  CARDIOVASCULAR: Normal heart rate noted RESPIRATORY: Effort normal, no problems with respiration noted ABDOMEN: Soft, nontender, nondistended, gravid.  Fetal monitoring: FHR: 120 bpm, Variability: moderate with periods of minimal Accelerations: Absent, Decelerations: Absent  On continuous EFM while on magnesium Uterine activity: None  Results for orders placed or performed during the hospital encounter of 03/23/24 (from the past 48 hours)  Urinalysis, Routine w reflex microscopic -Urine, Clean Catch     Status: Abnormal   Collection Time: 03/23/24  4:03 PM  Result Value Ref Range   Color, Urine YELLOW YELLOW   APPearance HAZY (A) CLEAR   Specific Gravity, Urine 1.016 1.005 - 1.030   pH 6.0 5.0 - 8.0   Glucose, UA NEGATIVE NEGATIVE mg/dL   Hgb urine dipstick NEGATIVE NEGATIVE   Bilirubin Urine NEGATIVE NEGATIVE   Ketones, ur NEGATIVE NEGATIVE mg/dL   Protein, ur 899 (A) NEGATIVE mg/dL   Nitrite NEGATIVE NEGATIVE   Leukocytes,Ua NEGATIVE NEGATIVE   RBC / HPF 0-5 0 - 5 RBC/hpf   WBC, UA 6-10 0 - 5 WBC/hpf   Bacteria, UA RARE (A) NONE SEEN   Squamous Epithelial / HPF 0-5 0 - 5 /HPF   Mucus PRESENT     Comment: Performed at [facility], [address] [place], [place] [address]   Hepatitis B surface antigen     Status: None   Collection Time: 03/23/24  4:14 PM  Result Value Ref Range   Hepatitis B Surface Ag NON REACTIVE NON REACTIVE    Comment: Performed at [facility], [address] [place], [place] [address]  RPR     Status: None   Collection Time: 03/23/24  4:14 PM  Result Value Ref Range   RPR Ser Ql NON REACTIVE NON REACTIVE    Comment: Performed at [facility], [address], [place], [place] [address]  CBC     Status: Abnormal   Collection Time: 03/23/24  4:14 PM  Result Value Ref Range   WBC 7.9 4.0 - 10.5 K/uL   RBC 3.81 (L) 3.87 - 5.11 MIL/uL   Hemoglobin 11.7 (L) 12.0 - 15.0 g/dL   HCT 63.5 63.9 - 53.9 %   MCV 95.5 80.0 - 100.0 fL   MCH 30.7 26.0 - 34.0 pg   MCHC 32.1 30.0 - 36.0 g/dL   RDW 86.7 88.4 - 84.4 %   Platelets 110 (L) 150 - 400 K/uL   nRBC 0.0 0.0 - 0.2 %    Comment: Performed at [facility], [address], [place], [place] [address]  Differential     Status: None   Collection Time: 03/23/24  4:14 PM  Result Value Ref Range   Neutrophils Relative % 60 %   Neutro Abs 4.9 1.7 - 7.7 K/uL   Lymphocytes Relative 29 %  Lymphs Abs 2.3 0.7 - 4.0 K/uL   Monocytes Relative 8 %   Monocytes Absolute 0.6 0.1 - 1.0 K/uL   Eosinophils Relative 1 %   Eosinophils Absolute 0.1 0.0 - 0.5 K/uL   Basophils Relative 1 %   Basophils Absolute 0.0 0.0 - 0.1 K/uL   Immature Granulocytes 1 %   Abs Immature Granulocytes 0.04 0.00 - 0.07 K/uL    Comment: Performed at [facility], [address] [place], [place] [address]  Type and screen [facility]     Status: None   Collection Time: 03/23/24  4:14 PM  Result Value Ref Range   ABO/RH(D) O POS    Antibody Screen NEG    Sample Expiration      03/26/2024,2359 Performed at [facility], [address], [place], [place] [address]   HIV Antibody (routine testing w rflx)     Status: None   Collection Time: 03/23/24  4:14 PM  Result Value Ref  Range   HIV Screen 4th Generation wRfx Non Reactive Non Reactive    Comment: Performed at [facility], [address], [place], [place] [address]  Hemoglobin A1c     Status: Abnormal   Collection Time: 03/23/24  4:14 PM  Result Value Ref Range   Hgb A1c MFr Bld 4.6 (L) 4.8 - 5.6 %    Comment: (NOTE) Diagnosis of Diabetes The following HbA1c ranges recommended by the American Diabetes Association (ADA) may be used as an aid in the diagnosis of diabetes mellitus.  Hemoglobin             Suggested A1C NGSP%              Diagnosis  <5.7                   Non Diabetic  5.7-6.4                Pre-Diabetic  >6.4                   Diabetic  <7.0                   Glycemic control for                       adults with diabetes.     Mean Plasma Glucose 85.32 mg/dL    Comment: Performed at [facility], [address], [place], [place] [address]  Comprehensive metabolic panel     Status: Abnormal   Collection Time: 03/23/24  4:14 PM  Result Value Ref Range   Sodium 135 135 - 145 mmol/L   Potassium 4.2 3.5 - 5.1 mmol/L   Chloride 105 98 - 111 mmol/L   CO2 19 (L) 22 - 32 mmol/L   Glucose, Bld 66 (L) 70 - 99 mg/dL    Comment: Glucose reference range applies only to samples taken after fasting for at least 8 hours.   BUN 10 6 - 20 mg/dL   Creatinine, Ser 9.20 0.44 - 1.00 mg/dL   Calcium  8.8 (L) 8.9 - 10.3 mg/dL   Total Protein 6.7 6.5 - 8.1 g/dL   Albumin 2.8 (L) 3.5 - 5.0 g/dL   AST 21 15 - 41 U/L   ALT 12 0 - 44 U/L   Alkaline Phosphatase 109 38 - 126 U/L   Total Bilirubin 0.3 0.0 - 1.2 mg/dL   GFR,  Estimated >60 >60 mL/min    Comment: (NOTE) Calculated using the CKD-EPI Creatinine Equation (2021)    Anion gap 11 5 - 15    Comment: Performed at [facility], [address], [place], [place] [address]  Wet prep, genital     Status: Abnormal   Collection Time: 03/23/24  4:52 PM  Result Value Ref Range   Yeast Wet Prep HPF POC NONE SEEN NONE SEEN   Trich,  Wet Prep NONE SEEN NONE SEEN   Clue Cells Wet Prep HPF POC PRESENT (A) NONE SEEN   WBC, Wet Prep HPF POC <10 <10   Sperm NONE SEEN     Comment: Performed at [facility], [address], [place], [place] [address]  Protein / creatinine ratio, urine     Status: Abnormal   Collection Time: 03/23/24  5:54 PM  Result Value Ref Range   Creatinine, Urine 139 mg/dL   Total Protein, Urine 157 mg/dL    Comment: RESULT CONFIRMED BY MANUAL DILUTION NO NORMAL RANGE ESTABLISHED FOR THIS TEST    Protein Creatinine Ratio 1.13 (H) 0.00 - 0.15 mg/mg[Cre]    Comment: Performed at [facility], [address], [place], [place] [address]  Comprehensive metabolic panel     Status: Abnormal   Collection Time: 03/24/24  4:27 AM  Result Value Ref Range   Sodium 133 (L) 135 - 145 mmol/L   Potassium 4.3 3.5 - 5.1 mmol/L   Chloride 104 98 - 111 mmol/L   CO2 18 (L) 22 - 32 mmol/L   Glucose, Bld 68 (L) 70 - 99 mg/dL    Comment: Glucose reference range applies only to samples taken after fasting for at least 8 hours.   BUN 8 6 - 20 mg/dL   Creatinine, Ser 9.26 0.44 - 1.00 mg/dL   Calcium  8.6 (L) 8.9 - 10.3 mg/dL   Total Protein 5.5 (L) 6.5 - 8.1 g/dL   Albumin 2.4 (L) 3.5 - 5.0 g/dL   AST 16 15 - 41 U/L   ALT 12 0 - 44 U/L   Alkaline Phosphatase 95 38 - 126 U/L   Total Bilirubin 0.3 0.0 - 1.2 mg/dL   GFR, Estimated >39 >39 mL/min    Comment: (NOTE) Calculated using the CKD-EPI Creatinine Equation (2021)    Anion gap 11 5 - 15    Comment: Performed at [facility], [address] [place], [place] [address]  CBC     Status: Abnormal   Collection Time: 03/24/24  4:27 AM  Result Value Ref Range   WBC 7.2 4.0 - 10.5 K/uL   RBC 3.14 (L) 3.87 - 5.11 MIL/uL   Hemoglobin 9.7 (L) 12.0 - 15.0 g/dL   HCT 70.4 (L) 63.9 - 53.9 %   MCV 93.9 80.0 - 100.0 fL   MCH 30.9 26.0 - 34.0 pg   MCHC 32.9 30.0 - 36.0 g/dL   RDW 86.7 88.4 - 84.4 %   Platelets 100 (L) 150 - 400 K/uL   nRBC 0.0 0.0 - 0.2 %     Comment: Performed at [facility], [address], [place], [place] [address]  Comprehensive metabolic panel     Status: Abnormal   Collection Time: 03/25/24  4:33 AM  Result Value Ref Range   Sodium 129 (L) 135 - 145 mmol/L   Potassium 4.0 3.5 - 5.1 mmol/L   Chloride 100 98 - 111 mmol/L   CO2 19 (L) 22 -  32 mmol/L   Glucose, Bld 102 (H) 70 - 99 mg/dL    Comment: Glucose reference range applies only to samples taken after fasting for at least 8 hours.   BUN 8 6 - 20 mg/dL   Creatinine, Ser 9.20 0.44 - 1.00 mg/dL   Calcium  8.2 (L) 8.9 - 10.3 mg/dL   Total Protein 6.7 6.5 - 8.1 g/dL   Albumin 2.8 (L) 3.5 - 5.0 g/dL   AST 18 15 - 41 U/L   ALT 12 0 - 44 U/L   Alkaline Phosphatase 107 38 - 126 U/L   Total Bilirubin 0.4 0.0 - 1.2 mg/dL   GFR, Estimated >39 >39 mL/min    Comment: (NOTE) Calculated using the CKD-EPI Creatinine Equation (2021)    Anion gap 10 5 - 15    Comment: Performed at [facility], [address], [place], [place] [address]  CBC     Status: Abnormal   Collection Time: 03/25/24  4:33 AM  Result Value Ref Range   WBC 14.6 (H) 4.0 - 10.5 K/uL   RBC 3.64 (L) 3.87 - 5.11 MIL/uL   Hemoglobin 11.3 (L) 12.0 - 15.0 g/dL   HCT 65.5 (L) 63.9 - 53.9 %   MCV 94.5 80.0 - 100.0 fL   MCH 31.0 26.0 - 34.0 pg   MCHC 32.8 30.0 - 36.0 g/dL   RDW 87.1 88.4 - 84.4 %   Platelets 113 (L) 150 - 400 K/uL   nRBC 0.0 0.0 - 0.2 %    Comment: Performed at [facility], [address], [place], [place] [address]    US  MFM OB FOLLOW UP Result Date: 03/23/2024 ----------------------------------------------------------------------  OBSTETRICS REPORT                       (Signed Final 03/23/2024 04:01 pm) ---------------------------------------------------------------------- Patient Info  ID #:       [id number]                          D.O.B.:  12/04/1980 (43 yrs)(F)  Name:       [name]                Visit Date: 03/23/2024 01:12 pm  ---------------------------------------------------------------------- Performed By  Attending:        Fredia Fresh MD        Ref. Address:     [address]                                                             [address]                                                             [place]  [address]  Performed By:     Jonette Nap        [facility]                                  [facility]  Referred By:      [facility] ---------------------------------------------------------------------- Orders  #  Description                           Code        Ordered By  1  US  MFM OB FOLLOW UP                   76816.01    YU FANG  2  US  MFM FETAL BPP WO NON               76819.01    YU FANG     STRESS  3  US  MFM UA CORD DOPPLER                76820.02    YU FANG ----------------------------------------------------------------------  #  Order #                     Accession #                Episode #  1  [id number]                   [id number]                 [id number]  2  [id number]                   [id number]                 [id number]  3  [id number]                   [id number]                 [id number] ---------------------------------------------------------------------- Indications  Advanced maternal age multigravida 33+,        O75.523  third trimester  Elevated blood pressure affecting pregnancy    O13.3  in third trimester  Obesity complicating pregnancy (BMI 40)        O99.210 E66.9  Tobacco use complicating pregnancy, third  N00.666  trimester  Medical complication of pregnancy (NSTEMI)     O26.90  Previous cesarean delivery, antepartum x 3     O34.219  Encounter for other antenatal screening        Z36.2  follow-up  [redacted]  weeks gestation of pregnancy                Z3A.32 ---------------------------------------------------------------------- Fetal Evaluation  Num Of Fetuses:         1  Fetal Heart Rate(bpm):  132  Cardiac Activity:       Observed  Presentation:           Cephalic  Placenta:               Anterior  P. Cord Insertion:      Previously seen  Amniotic Fluid  AFI FV:      Within normal limits  AFI Sum(cm)     %Tile       Largest Pocket(cm)  13.17           41          4.42  RUQ(cm)       RLQ(cm)       LUQ(cm)        LLQ(cm)  4.42          2.18          3.41           3.16 ---------------------------------------------------------------------- Biophysical Evaluation  Amniotic F.V:   Pocket => 2 cm             F. Tone:        Observed  F. Movement:    Observed                   Score:          6/8  F. Breathing:   Not Observed ---------------------------------------------------------------------- Biometry  BPD:      76.6  mm     G. Age:  30w 5d          7  %    CI:         73.9   %    70 - 86                                                          FL/HC:      20.2   %    19.1 - 21.3  HC:       283   mm     G. Age:  31w 0d        2.2  %    HC/AC:      1.03        0.96 - 1.17  AC:      275.8  mm     G. Age:  31w 5d         30  %    FL/BPD:     74.7   %    71 - 87  FL:       57.2  mm     G. Age:  30w 0d        2.1  %    FL/AC:      20.7   %    20 -  24  Est. FW:    1680  gm    3 lb 11 oz      10  % ---------------------------------------------------------------------- OB History  Gravidity:    4         Term:   3        Prem:   0        SAB:   0  TOP:          0       Ectopic:  0        Living: 3 ---------------------------------------------------------------------- Gestational Age  LMP:           32w 2d        Date:  08/10/23                   EDD:   05/16/24  U/S Today:     30w 6d                                        EDD:   05/26/24  Best:          bobbye 2d     Det. By:  LMP  (08/10/23)          EDD:   05/16/24  ---------------------------------------------------------------------- Anatomy  Cranium:               Previously seen        Aortic Arch:            Previously seen  Cavum:                 Previously seen        Ductal Arch:            Previously seen  Ventricles:            Appears normal         Diaphragm:              Appears normal  Choroid Plexus:        Previously seen        Stomach:                Appears normal, left                                                                        sided  Cerebellum:            Previously seen        Abdomen:                Previously seen  Posterior Fossa:       Previously seen        Abdominal Wall:         Previously seen  Face:                  Orbits and profile     Cord Vessels:           Previously seen  previously seen  Lips:                  Previously seen        Kidneys:                Appear normal  Thoracic:              Previously seen        Bladder:                Appears normal  Heart:                 Appears normal         Spine:                  Previously seen                         (4CH, axis, and                         situs)  RVOT:                  Previously seen        Upper Extremities:      Previously seen  LVOT:                  Previously seen        Lower Extremities:      Previously seen  Other:  Fetal anatomic survey complete on prior scans. ---------------------------------------------------------------------- Doppler - Fetal Vessels  Umbilical Artery   S/D     %tile      RI    %tile      PI    %tile     PSV    ADFV    RDFV                                                     (cm/s)   2.67       51    0.63       60    0.94       59    41.67      No      No ---------------------------------------------------------------------- Impression  Patient is here for fetal growth assessment and antenatal  testing.  She was discharged from [facility] practice after  several attempts to communicate with her failed. She has not   had prenatal care for about 2 months.  She has not had  screening for gestational diabetes.  Past medical history is significant for NSTEMI in August  2023.  Patient recovered well and had normal ejection  fraction on echocardiography.  She does not have any  cardiovascular symptoms including shortness of breath or  chest pain now.  Blood pressures today at our office were 162/87 and repeat  139/94 mmHg.  Obstetrical history is significant for 3 term vaginal deliveries.  Ultrasound  The estimated fetal weight is at the 10th percentile and the  abdominal circumference measurement at the 30th  percentile.  Normal amniotic fluid.  Fetal breathing  movements did not meet the criteria BPP.  Umbilical artery  Doppler showed normal forward diastolic flow.  BPP 6/8.  History of myocardial infarction  Patient  likely to be in modified WHO Class III because of a  history of myocardial infarction.  She has a high risk for  significant maternal morbidity.  She had missed her cardio  obstetric clinic appointments.  If echocardiographic  abnormalities are present, she may be in [facility] Class IV.  She is at high risk for cardiac failure and arrhythmias.  I counseled the patient that she needs to be evaluated by our  cardiologist.  Hypertension in pregnancy  I discussed the possibility of gestational hypertension  complicating her pregnancy.  I discussed the possible  complications including preeclampsia, maternal  complications including pulmonary edema renal failure and  coagulation disturbances.  I recommended that the patient be evaluated at the MAU with  series of blood pressures and if possible cardiology  consultation.  I discussed with Dr. Cleatus, who will evaluate the patient in  the hospital. ---------------------------------------------------------------------- Recommendations  -Evaluation at the MAU with possible admission and  cardiology consultation.  - Continue weekly antenatal testing till delivery.  ----------------------------------------------------------------------                  Fredia Fresh, MD Electronically Signed Final Report   03/23/2024 04:01 pm ----------------------------------------------------------------------   US  MFM FETAL BPP WO NON STRESS Result Date: 03/23/2024 ----------------------------------------------------------------------  OBSTETRICS REPORT                       (Signed Final 03/23/2024 04:01 pm) ---------------------------------------------------------------------- Patient Info  ID #:       [id number]                          D.O.B.:  08/14/1980 (43 yrs)(F)  Name:       [name]                Visit Date: 03/23/2024 01:12 pm ---------------------------------------------------------------------- Performed By  Attending:        Fredia Fresh MD        Ref. Address:     [address]                                                             [address]                                                             [place]                                                             [address]  Performed By:     Jonette Nap        [facility]  Referred By:      [facility] ---------------------------------------------------------------------- Orders  #  Description                           Code        Ordered By  1  US  MFM OB FOLLOW UP                   76816.01    YU FANG  2  US  MFM FETAL BPP WO NON               76819.01    YU FANG     STRESS  3  US  MFM UA CORD DOPPLER                76820.02    YU FANG ----------------------------------------------------------------------  #  Order #                     Accession #                Episode #  1  [id number]                   [id number]                  [id number]  2  [id number]                   [id number]                 [id number]  3  [id number]                   [id number]                 [id number] ---------------------------------------------------------------------- Indications  Advanced maternal age multigravida 52+,        O5.523  third trimester  Elevated blood pressure affecting pregnancy    O13.3  in third trimester  Obesity complicating pregnancy (BMI 40)        O99.210 E66.9  Tobacco use complicating pregnancy, third      N00.666  trimester  Medical complication of pregnancy (NSTEMI)     O26.90  Previous cesarean delivery, antepartum x 3     O34.219  Encounter for other antenatal screening        Z36.2  follow-up  [redacted] weeks gestation of pregnancy                Z3A.32 ---------------------------------------------------------------------- Fetal Evaluation  Num Of Fetuses:         1  Fetal Heart Rate(bpm):  132  Cardiac Activity:       Observed  Presentation:           Cephalic  Placenta:               Anterior  P. Cord Insertion:      Previously seen  Amniotic Fluid  AFI FV:      Within normal limits  AFI Sum(cm)     %Tile       Largest Pocket(cm)  13.17           41          4.42  RUQ(cm)       RLQ(cm)       LUQ(cm)        LLQ(cm)  4.42          2.18          3.41           3.16 ---------------------------------------------------------------------- Biophysical Evaluation  Amniotic F.V:   Pocket => 2 cm             F. Tone:        Observed  F. Movement:    Observed                   Score:          6/8  F. Breathing:   Not Observed ---------------------------------------------------------------------- Biometry  BPD:      76.6  mm     G. Age:  30w 5d          7  %    CI:         73.9   %    70 - 86                                                          FL/HC:      20.2   %    19.1 - 21.3  HC:       283   mm     G. Age:  31w 0d        2.2  %    HC/AC:      1.03        0.96 - 1.17  AC:      275.8  mm     G. Age:  31w 5d         30  %    FL/BPD:     74.7   %     71 - 87  FL:       57.2  mm     G. Age:  30w 0d        2.1  %    FL/AC:      20.7   %    20 - 24  Est. FW:    1680  gm    3 lb 11 oz      10  % ---------------------------------------------------------------------- OB History  Gravidity:    4         Term:   3        Prem:   0        SAB:   0  TOP:          0  Ectopic:  0        Living: 3 ---------------------------------------------------------------------- Gestational Age  LMP:           32w 2d        Date:  08/10/23                   EDD:   05/16/24  U/S Today:     30w 6d                                        EDD:   05/26/24  Best:          bobbye 2d     Det. By:  LMP  (08/10/23)          EDD:   05/16/24 ---------------------------------------------------------------------- Anatomy  Cranium:               Previously seen        Aortic Arch:            Previously seen  Cavum:                 Previously seen        Ductal Arch:            Previously seen  Ventricles:            Appears normal         Diaphragm:              Appears normal  Choroid Plexus:        Previously seen        Stomach:                Appears normal, left                                                                        sided  Cerebellum:            Previously seen        Abdomen:                Previously seen  Posterior Fossa:       Previously seen        Abdominal Wall:         Previously seen  Face:                  Orbits and profile     Cord Vessels:           Previously seen                         previously seen  Lips:                  Previously seen        Kidneys:                Appear normal  Thoracic:              Previously seen        Bladder:  Appears normal  Heart:                 Appears normal         Spine:                  Previously seen                         (4CH, axis, and                         situs)  RVOT:                  Previously seen        Upper Extremities:      Previously seen  LVOT:                  Previously seen        Lower  Extremities:      Previously seen  Other:  Fetal anatomic survey complete on prior scans. ---------------------------------------------------------------------- Doppler - Fetal Vessels  Umbilical Artery   S/D     %tile      RI    %tile      PI    %tile     PSV    ADFV    RDFV                                                     (cm/s)   2.67       51    0.63       60    0.94       59    41.67      No      No ---------------------------------------------------------------------- Impression  Patient is here for fetal growth assessment and antenatal  testing.  She was discharged from [facility] practice after  several attempts to communicate with her failed. She has not  had prenatal care for about 2 months.  She has not had  screening for gestational diabetes.  Past medical history is significant for NSTEMI in August  2023.  Patient recovered well and had normal ejection  fraction on echocardiography.  She does not have any  cardiovascular symptoms including shortness of breath or  chest pain now.  Blood pressures today at our office were 162/87 and repeat  139/94 mmHg.  Obstetrical history is significant for 3 term vaginal deliveries.  Ultrasound  The estimated fetal weight is at the 10th percentile and the  abdominal circumference measurement at the 30th  percentile.  Normal amniotic fluid.  Fetal breathing  movements did not meet the criteria BPP.  Umbilical artery  Doppler showed normal forward diastolic flow.  BPP 6/8.  History of myocardial infarction  Patient likely to be in modified WHO Class III because of a  history of myocardial infarction.  She has a high risk for  significant maternal morbidity.  She had missed her cardio  obstetric clinic appointments.  If echocardiographic  abnormalities are present, she may be in [facility] Class IV.  She is at high risk for cardiac failure and arrhythmias.  I counseled the patient that she needs to be evaluated by our  cardiologist.  Hypertension in pregnancy  I discussed the  possibility of  gestational hypertension  complicating her pregnancy.  I discussed the possible  complications including preeclampsia, maternal  complications including pulmonary edema renal failure and  coagulation disturbances.  I recommended that the patient be evaluated at the MAU with  series of blood pressures and if possible cardiology  consultation.  I discussed with Dr. Cleatus, who will evaluate the patient in  the hospital. ---------------------------------------------------------------------- Recommendations  -Evaluation at the MAU with possible admission and  cardiology consultation.  - Continue weekly antenatal testing till delivery. ----------------------------------------------------------------------                  Fredia Fresh, MD Electronically Signed Final Report   03/23/2024 04:01 pm ----------------------------------------------------------------------   US  MFM UA CORD DOPPLER Result Date: 03/23/2024 ----------------------------------------------------------------------  OBSTETRICS REPORT                       (Signed Final 03/23/2024 04:01 pm) ---------------------------------------------------------------------- Patient Info  ID #:       [id number]                          D.O.B.:  24-Feb-1981 (43 yrs)(F)  Name:       [name]                Visit Date: 03/23/2024 01:12 pm ---------------------------------------------------------------------- Performed By  Attending:        Fredia Fresh MD        Ref. Address:     [address] [place]                                                             [address]                                                             [place]                                                             [address]  Performed By:     Jonette Nap        [facility]                                  [facility]                                                             [facility]  Referred By:      Marlis Leash                    OB GYN ---------------------------------------------------------------------- Orders  #  Description                           Code        Ordered By  1  US  MFM OB FOLLOW UP                   [id number]    YU FANG  2  US  MFM FETAL BPP WO NON               76819.01    YU FANG     STRESS  3  US  MFM UA CORD DOPPLER                76820.02    YU FANG ----------------------------------------------------------------------  #  Order #                     Accession #                Episode #  1  [id number]                   [id number]  2  [id number]                   [id number]                 [id number]  3  [id number]                   [id number]                 [id number] ---------------------------------------------------------------------- Indications  Advanced maternal age multigravida 60+,        O65.523  third trimester  Elevated blood pressure affecting pregnancy    O13.3  in third trimester  Obesity complicating pregnancy (BMI 40)        O99.210 E66.9  Tobacco use complicating pregnancy, third      N00.666  trimester  Medical complication of pregnancy (NSTEMI)     O26.90  Previous cesarean delivery, antepartum x 3     O34.219  Encounter for other antenatal screening        Z36.2  follow-up  [redacted] weeks gestation of pregnancy                Z3A.32 ---------------------------------------------------------------------- Fetal Evaluation  Num Of Fetuses:         1  Fetal Heart Rate(bpm):  132  Cardiac Activity:       Observed  Presentation:           Cephalic  Placenta:               Anterior  P. Cord Insertion:      Previously seen  Amniotic Fluid  AFI FV:      Within normal limits  AFI Sum(cm)     %Tile       Largest Pocket(cm)  13.17           41          4.42  RUQ(cm)       RLQ(cm)       LUQ(cm)  LLQ(cm)  4.42          2.18          3.41           3.16  ---------------------------------------------------------------------- Biophysical Evaluation  Amniotic F.V:   Pocket => 2 cm             F. Tone:        Observed  F. Movement:    Observed                   Score:          6/8  F. Breathing:   Not Observed ---------------------------------------------------------------------- Biometry  BPD:      76.6  mm     G. Age:  30w 5d          7  %    CI:         73.9   %    70 - 86                                                          FL/HC:      20.2   %    19.1 - 21.3  HC:       283   mm     G. Age:  31w 0d        2.2  %    HC/AC:      1.03        0.96 - 1.17  AC:      275.8  mm     G. Age:  31w 5d         30  %    FL/BPD:     74.7   %    71 - 87  FL:       57.2  mm     G. Age:  30w 0d        2.1  %    FL/AC:      20.7   %    20 - 24  Est. FW:    1680  gm    3 lb 11 oz      10  % ---------------------------------------------------------------------- OB History  Gravidity:    4         Term:   3        Prem:   0        SAB:   0  TOP:          0       Ectopic:  0        Living: 3 ---------------------------------------------------------------------- Gestational Age  LMP:           32w 2d        Date:  08/10/23                   EDD:   05/16/24  U/S Today:     30w 6d                                        EDD:   05/26/24  Best:          bobbye 2d  Det. By:  LMP  (08/10/23)          EDD:   05/16/24 ---------------------------------------------------------------------- Anatomy  Cranium:               Previously seen        Aortic Arch:            Previously seen  Cavum:                 Previously seen        Ductal Arch:            Previously seen  Ventricles:            Appears normal         Diaphragm:              Appears normal  Choroid Plexus:        Previously seen        Stomach:                Appears normal, left                                                                        sided  Cerebellum:            Previously seen        Abdomen:                Previously  seen  Posterior Fossa:       Previously seen        Abdominal Wall:         Previously seen  Face:                  Orbits and profile     Cord Vessels:           Previously seen                         previously seen  Lips:                  Previously seen        Kidneys:                Appear normal  Thoracic:              Previously seen        Bladder:                Appears normal  Heart:                 Appears normal         Spine:                  Previously seen                         (4CH, axis, and                         situs)  RVOT:                  Previously seen  Upper Extremities:      Previously seen  LVOT:                  Previously seen        Lower Extremities:      Previously seen  Other:  Fetal anatomic survey complete on prior scans. ---------------------------------------------------------------------- Doppler - Fetal Vessels  Umbilical Artery   S/D     %tile      RI    %tile      PI    %tile     PSV    ADFV    RDFV                                                     (cm/s)   2.67       51    0.63       60    0.94       59    41.67      No      No ---------------------------------------------------------------------- Impression  Patient is here for fetal growth assessment and antenatal  testing.  She was discharged from [facility] practice after  several attempts to communicate with her failed. She has not  had prenatal care for about 2 months.  She has not had  screening for gestational diabetes.  Past medical history is significant for NSTEMI in August  2023.  Patient recovered well and had normal ejection  fraction on echocardiography.  She does not have any  cardiovascular symptoms including shortness of breath or  chest pain now.  Blood pressures today at our office were 162/87 and repeat  139/94 mmHg.  Obstetrical history is significant for 3 term vaginal deliveries.  Ultrasound  The estimated fetal weight is at the 10th percentile and the  abdominal circumference measurement at  the 30th  percentile.  Normal amniotic fluid.  Fetal breathing  movements did not meet the criteria BPP.  Umbilical artery  Doppler showed normal forward diastolic flow.  BPP 6/8.  History of myocardial infarction  Patient likely to be in modified WHO Class III because of a  history of myocardial infarction.  She has a high risk for  significant maternal morbidity.  She had missed her cardio  obstetric clinic appointments.  If echocardiographic  abnormalities are present, she may be in [facility] Class IV.  She is at high risk for cardiac failure and arrhythmias.  I counseled the patient that she needs to be evaluated by our  cardiologist.  Hypertension in pregnancy  I discussed the possibility of gestational hypertension  complicating her pregnancy.  I discussed the possible  complications including preeclampsia, maternal  complications including pulmonary edema renal failure and  coagulation disturbances.  I recommended that the patient be evaluated at the MAU with  series of blood pressures and if possible cardiology  consultation.  I discussed with Dr. Cleatus, who will evaluate the patient in  the hospital. ---------------------------------------------------------------------- Recommendations  -Evaluation at the MAU with possible admission and  cardiology consultation.  - Continue weekly antenatal testing till delivery. ----------------------------------------------------------------------                  Fredia Fresh, MD Electronically Signed Final Report   03/23/2024 04:01 pm ----------------------------------------------------------------------    Current scheduled medications  betamethasone acetate-betamethasone sodium phosphate  12  mg Intramuscular Q24 Hr x 2   docusate sodium  100 mg Oral Daily   NIFEdipine  60 mg Oral Daily   prenatal multivitamin  1 tablet Oral Q1200    I have reviewed the patient's current medications.  ASSESSMENT: Principal Problem:   Chronic hypertension affecting  pregnancy Active Problems:   CAD (coronary artery disease)   Tobacco abuse   History of 3 cesarean sections   UTI in pregnancy   Gestational thrombocytopenia   Chronic hypertension with superimposed preeclampsia   PLAN: CHTN with SIPE with SF - P/C ratio 1.1 c/w SIPE - Continue procardia XL 60mg  daily - Mag for seizure ppx, will be s/p 24h infusion this AM - Daily labs, plt stable at 113  - Repeat CS scheduled 10/30 at [redacted]w[redacted]d (currently no OR availability 10/29, but will move patient if OR becomes available)  Gestational thrombocytopenia - Plt stable at 113, nadir 100 on 10/18 - Trending daily. Expect some rise from BMZ. She had low platelets with her last delivery as well, but given SIPE, will watch closely.   CAD - EKG done but not yet released.  - Last Echo in 2023 was normal EF.  - Spoke with cardiology for her h/o NSTEMI and CAD. Agree with EKG. If EKG normal and patient asymptomatic, Echo not required at this time. Otherwise, agree with management of HTN as indicated by our obstetric standards.  - Hold on ASA currently due to platelets - but if improving may continue.   Tobacco abuse - May use nicotine  patch if needed.   H/o 3 prior c-sections - 10/29 not available at this time which would be [redacted]w[redacted]d. Currently scheduled for 10/30.   UTI in pregnancy - Urine culture ordered. No symptoms currently.   Routine PNC - No gtt. A1C was 4.6.  - Will offer TDAP.  - Plans breast and formula - Baby girl - Declines contraception.   Neonatal - s/p NICU consult - s/p BMZ 10/18-19 - Continue NST daily - EFW 10%ile with normal dopplers - BPP twice weekly. BPP 8/8 yesterday so we will do Tu/F.   Continue routine antenatal care.  Kieth Carolin, MD Obstetrician & Gynecologist, [facility] for [employer], [facility]

## 2024-03-26 ENCOUNTER — Telehealth: Payer: Self-pay | Admitting: Obstetrics and Gynecology

## 2024-03-26 LAB — CULTURE, OB URINE: Culture: 90000 — AB

## 2024-03-26 LAB — COMPREHENSIVE METABOLIC PANEL WITH GFR
ALT: 15 U/L (ref 0–44)
AST: 26 U/L (ref 15–41)
Albumin: 3.1 g/dL — ABNORMAL LOW (ref 3.5–5.0)
Alkaline Phosphatase: 112 U/L (ref 38–126)
Anion gap: 11 (ref 5–15)
BUN: 12 mg/dL (ref 6–20)
CO2: 19 mmol/L — ABNORMAL LOW (ref 22–32)
Calcium: 8.7 mg/dL — ABNORMAL LOW (ref 8.9–10.3)
Chloride: 103 mmol/L (ref 98–111)
Creatinine, Ser: 0.92 mg/dL (ref 0.44–1.00)
GFR, Estimated: >60 mL/min (ref >60)
Glucose, Bld: 119 mg/dL — ABNORMAL HIGH (ref 70–99)
Potassium: 4.1 mmol/L (ref 3.5–5.1)
Sodium: 133 mmol/L — ABNORMAL LOW (ref 135–145)
Total Bilirubin: 0.2 mg/dL (ref 0.0–1.2)
Total Protein: 7.1 g/dL (ref 6.5–8.1)

## 2024-03-26 LAB — GC/CHLAMYDIA PROBE AMP (~~LOC~~) NOT AT ARMC
Chlamydia: NEGATIVE
Comment: NEGATIVE
Comment: NORMAL
Neisseria Gonorrhea: NEGATIVE

## 2024-03-26 LAB — CULTURE, BETA STREP (GROUP B ONLY)

## 2024-03-26 LAB — CBC
HCT: 35.6 % — ABNORMAL LOW (ref 36.0–46.0)
Hemoglobin: 11.8 g/dL — ABNORMAL LOW (ref 12.0–15.0)
MCH: 31.4 pg (ref 26.0–34.0)
MCHC: 33.1 g/dL (ref 30.0–36.0)
MCV: 94.7 fL (ref 80.0–100.0)
Platelets: 135 K/uL — ABNORMAL LOW (ref 150–400)
RBC: 3.76 MIL/uL — ABNORMAL LOW (ref 3.87–5.11)
RDW: 13.2 % (ref 11.5–15.5)
WBC: 19.6 K/uL — ABNORMAL HIGH (ref 4.0–10.5)
nRBC: 0 % (ref 0.0–0.2)

## 2024-03-26 MED ORDER — TETANUS-DIPHTH-ACELL PERTUSSIS 5-2-15.5 LF-MCG/0.5 IM SUSP
0.5000 mL | Freq: Once | INTRAMUSCULAR | Status: AC
  Administered 2024-03-26: 0.5 mL via INTRAMUSCULAR
  Filled 2024-03-26: qty 0.5

## 2024-03-26 NOTE — Progress Notes (Signed)
-  Discussed contraceptive management options -pt has had Mirena IUD in the past and wishes to continue with this form of contraception -plan to place IUD immediately post-placenta -Discussed risk/benefit, pt agreeable  Delon Prude, DO Attending Obstetrician & Gynecologist, Biochemist, clinical for [employer], [facility]

## 2024-03-26 NOTE — Progress Notes (Signed)
 FACULTY PRACTICE ANTEPARTUM COMPREHENSIVE PROGRESS NOTE  [name] is a 43 y.o. [phone] at [redacted]w[redacted]d who is admitted for [facility] with SIPE.  Estimated Date of Delivery: 05/16/24 Fetal presentation is cephalic.  Length of Stay:  3 Days. Admitted 03/23/2024  Subjective: Doing well this AM. Looking forward to mag being done. ,Denies HA, visual changes, CP/SOB, RUQ/epigastric pain Denies ctx, VB, LOF. +FM  Vitals:  Blood pressure (!) 110/54, pulse 88, temperature 98.1 F (36.7 C), temperature source Oral, resp. rate 16, height 5' 7 (1.702 m), weight 125.7 kg, last menstrual period 08/10/2023, SpO2 100%. Physical Examination: CONSTITUTIONAL: Well-developed, well-nourished female in no acute distress.  CARDIOVASCULAR: Normal heart rate noted RESPIRATORY: Effort normal, no problems with respiration noted ABDOMEN: Soft, nontender, nondistended, gravid.  Fetal monitoring: FHR: 120 bpm, Variability: moderate with periods of minimal Accelerations: Absent, Decelerations: Absent  On continuous EFM while on magnesium Uterine activity: None  Results for orders placed or performed during the hospital encounter of 03/23/24 (from the past 48 hours)  Comprehensive metabolic panel     Status: Abnormal   Collection Time: 03/25/24  4:33 AM  Result Value Ref Range   Sodium 129 (L) 135 - 145 mmol/L   Potassium 4.0 3.5 - 5.1 mmol/L   Chloride 100 98 - 111 mmol/L   CO2 19 (L) 22 - 32 mmol/L   Glucose, Bld 102 (H) 70 - 99 mg/dL    Comment: Glucose reference range applies only to samples taken after fasting for at least 8 hours.   BUN 8 6 - 20 mg/dL   Creatinine, Ser 9.20 0.44 - 1.00 mg/dL   Calcium  8.2 (L) 8.9 - 10.3 mg/dL   Total Protein 6.7 6.5 - 8.1 g/dL   Albumin 2.8 (L) 3.5 - 5.0 g/dL   AST 18 15 - 41 U/L   ALT 12 0 - 44 U/L   Alkaline Phosphatase 107 38 - 126 U/L   Total Bilirubin 0.4 0.0 - 1.2 mg/dL   GFR, Estimated >39 >39 mL/min    Comment: (NOTE) Calculated using the CKD-EPI Creatinine  Equation (2021)    Anion gap 10 5 - 15    Comment: Performed at [facility], [address], [place], [place] [address]  CBC     Status: Abnormal   Collection Time: 03/25/24  4:33 AM  Result Value Ref Range   WBC 14.6 (H) 4.0 - 10.5 K/uL   RBC 3.64 (L) 3.87 - 5.11 MIL/uL   Hemoglobin 11.3 (L) 12.0 - 15.0 g/dL   HCT 65.5 (L) 63.9 - 53.9 %   MCV 94.5 80.0 - 100.0 fL   MCH 31.0 26.0 - 34.0 pg   MCHC 32.8 30.0 - 36.0 g/dL   RDW 87.1 88.4 - 84.4 %   Platelets 113 (L) 150 - 400 K/uL   nRBC 0.0 0.0 - 0.2 %    Comment: Performed at [facility], [address], [place], [place] [address]  Comprehensive metabolic panel     Status: Abnormal   Collection Time: 03/26/24  4:43 AM  Result Value Ref Range   Sodium 133 (L) 135 - 145 mmol/L   Potassium 4.1 3.5 - 5.1 mmol/L   Chloride 103 98 - 111 mmol/L   CO2 19 (L) 22 - 32 mmol/L   Glucose, Bld 119 (H) 70 - 99 mg/dL    Comment: Glucose reference range applies only to samples taken after fasting for at least 8 hours.   BUN 12 6 - 20  mg/dL   Creatinine, Ser 9.07 0.44 - 1.00 mg/dL   Calcium  8.7 (L) 8.9 - 10.3 mg/dL   Total Protein 7.1 6.5 - 8.1 g/dL   Albumin 3.1 (L) 3.5 - 5.0 g/dL   AST 26 15 - 41 U/L   ALT 15 0 - 44 U/L   Alkaline Phosphatase 112 38 - 126 U/L   Total Bilirubin 0.2 0.0 - 1.2 mg/dL   GFR, Estimated >39 >39 mL/min    Comment: (NOTE) Calculated using the CKD-EPI Creatinine Equation (2021)    Anion gap 11 5 - 15    Comment: Performed at [facility], [address] [place], [place] [address]  CBC     Status: Abnormal   Collection Time: 03/26/24  4:43 AM  Result Value Ref Range   WBC 19.6 (H) 4.0 - 10.5 K/uL   RBC 3.76 (L) 3.87 - 5.11 MIL/uL   Hemoglobin 11.8 (L) 12.0 - 15.0 g/dL   HCT 64.3 (L) 63.9 - 53.9 %   MCV 94.7 80.0 - 100.0 fL   MCH 31.4 26.0 - 34.0 pg   MCHC 33.1 30.0 - 36.0 g/dL   RDW 86.7 88.4 - 84.4 %   Platelets 135 (L) 150 - 400 K/uL   nRBC 0.0 0.0 - 0.2 %    Comment: Performed at  [facility], [address] [place], [place] [address]    No results found.   Current scheduled medications  docusate sodium  100 mg Oral Daily   NIFEdipine  60 mg Oral Daily   prenatal multivitamin  1 tablet Oral Q1200    I have reviewed the patient's current medications.  ASSESSMENT: Principal Problem:   Chronic hypertension affecting pregnancy Active Problems:   CAD (coronary artery disease)   Tobacco abuse   History of 3 cesarean sections   UTI in pregnancy   Gestational thrombocytopenia   Chronic hypertension with superimposed preeclampsia   PLAN: CHTN with SIPE with SF - P/C ratio 1.1 c/w SIPE - Continue procardia XL 60mg  daily - S/p Mag x24h for seizure ppx - Daily labs, plt 100 > 135, Cr 0.92 - Repeat CS scheduled 10/30 at [redacted]w[redacted]d (currently no OR availability 10/29, but will move patient if OR becomes available)  Gestational thrombocytopenia - Plt improved to 135, nadir 100 on 10/18 - Trending daily. Expect some rise from BMZ. She had low platelets with her last delivery as well, but given SIPE, will watch closely.   CAD - Spoke with cardiology for her h/o NSTEMI and CAD. Agree with EKG. If EKG normal and patient asymptomatic, Echo not required at this time. Otherwise, agree with management of HTN as indicated by our obstetric standards.  - f/u EKG final report; reported to be stable - Last Echo in 2023 was normal EF.  - Hold on ASA currently due to platelets - but if improving may continue.   Tobacco abuse - May use nicotine  patch if needed.   H/o 3 prior c-sections - 10/29 not available at this time which would be [redacted]w[redacted]d. Currently scheduled for 10/30 - Op notes are all available (just dated 10/07/10) - on 3rd CS some omental adhesions to anterior abdominal wall, otherwise normal anatomy  UTI in pregnancy - Ucx < 100k staph haemolyticus, asymptomatic so does not require abx  Routine PNC - No gtt. A1C was 4.6.  - Tdap ordered - Plans breast and  formula - Baby girl - Declines contraception.   Neonatal - s/p NICU consult -  s/p BMZ 10/18-19 - Continue NST daily - EFW 10%ile with normal dopplers - BPP Tu/F  Continue routine antenatal care.  Kieth Carolin, MD Obstetrician & Gynecologist, [facility] for [employer], [facility]

## 2024-03-26 NOTE — Telephone Encounter (Signed)
Attempted to reach patient about scheduling an appointment. 

## 2024-03-27 ENCOUNTER — Inpatient Hospital Stay (HOSPITAL_COMMUNITY)

## 2024-03-27 ENCOUNTER — Encounter: Admitting: Obstetrics and Gynecology

## 2024-03-27 DIAGNOSIS — O99213 Obesity complicating pregnancy, third trimester: Secondary | ICD-10-CM | POA: Diagnosis not present

## 2024-03-27 DIAGNOSIS — O133 Gestational [pregnancy-induced] hypertension without significant proteinuria, third trimester: Secondary | ICD-10-CM | POA: Diagnosis not present

## 2024-03-27 DIAGNOSIS — Z3A32 32 weeks gestation of pregnancy: Secondary | ICD-10-CM

## 2024-03-27 DIAGNOSIS — O1493 Unspecified pre-eclampsia, third trimester: Secondary | ICD-10-CM | POA: Diagnosis not present

## 2024-03-27 DIAGNOSIS — O09523 Supervision of elderly multigravida, third trimester: Secondary | ICD-10-CM | POA: Diagnosis not present

## 2024-03-27 DIAGNOSIS — E669 Obesity, unspecified: Secondary | ICD-10-CM

## 2024-03-27 LAB — COMPREHENSIVE METABOLIC PANEL WITH GFR
ALT: 15 U/L (ref 0–44)
AST: 18 U/L (ref 15–41)
Albumin: 2.4 g/dL — ABNORMAL LOW (ref 3.5–5.0)
Alkaline Phosphatase: 94 U/L (ref 38–126)
Anion gap: 6 (ref 5–15)
BUN: 11 mg/dL (ref 6–20)
CO2: 20 mmol/L — ABNORMAL LOW (ref 22–32)
Calcium: 8.4 mg/dL — ABNORMAL LOW (ref 8.9–10.3)
Chloride: 108 mmol/L (ref 98–111)
Creatinine, Ser: 0.81 mg/dL (ref 0.44–1.00)
GFR, Estimated: >60 mL/min (ref >60)
Glucose, Bld: 80 mg/dL (ref 70–99)
Potassium: 4.3 mmol/L (ref 3.5–5.1)
Sodium: 134 mmol/L — ABNORMAL LOW (ref 135–145)
Total Bilirubin: 0.5 mg/dL (ref 0.0–1.2)
Total Protein: 5.9 g/dL — ABNORMAL LOW (ref 6.5–8.1)

## 2024-03-27 LAB — CBC
HCT: 30.9 % — ABNORMAL LOW (ref 36.0–46.0)
Hemoglobin: 10.2 g/dL — ABNORMAL LOW (ref 12.0–15.0)
MCH: 31.2 pg (ref 26.0–34.0)
MCHC: 33 g/dL (ref 30.0–36.0)
MCV: 94.5 fL (ref 80.0–100.0)
Platelets: 108 K/uL — ABNORMAL LOW (ref 150–400)
RBC: 3.27 MIL/uL — ABNORMAL LOW (ref 3.87–5.11)
RDW: 13.4 % (ref 11.5–15.5)
WBC: 13.4 K/uL — ABNORMAL HIGH (ref 4.0–10.5)
nRBC: 0 % (ref 0.0–0.2)

## 2024-03-27 MED ORDER — POLYETHYLENE GLYCOL 3350 17 G PO PACK
17.0000 g | PACK | Freq: Every day | ORAL | Status: DC | PRN
  Administered 2024-03-27 – 2024-03-29 (×2): 17 g via ORAL
  Filled 2024-03-27 (×2): qty 1

## 2024-03-27 MED ORDER — POLYETHYLENE GLYCOL 3350 17 G PO PACK: 17.0000 g | PACK | Freq: Every day | ORAL | Status: DC | PRN

## 2024-03-27 MED ORDER — BISACODYL 5 MG PO TBEC
5.0000 mg | DELAYED_RELEASE_TABLET | Freq: Every day | ORAL | Status: DC | PRN
  Administered 2024-03-27 – 2024-03-29 (×2): 5 mg via ORAL
  Filled 2024-03-27 (×2): qty 1

## 2024-03-27 NOTE — Progress Notes (Signed)
 FACULTY PRACTICE ANTEPARTUM(COMPREHENSIVE) NOTE  DAO MEARNS is a 43 y.o. [id number] with Estimated Date of Delivery: 05/16/24   By  LMP [redacted]w[redacted]d  who is admitted for chronic HTN with superimposed preeclampsia.    Fetal presentation is cephalic. Length of Stay:  4  Days  Date of admission:03/23/2024  Subjective: Resting comfortably in bed, no acute complaints .  Denies headache, blurry vision or RUQ pain. Patient reports the fetal movement as active. Patient reports uterine contraction  activity as none. Patient reports  vaginal bleeding as none. Patient describes fluid per vagina as None.  Vitals:  Blood pressure (!) 148/84, pulse 68, temperature 98.5 F (36.9 C), temperature source Oral, resp. rate 17, height 5' 7 (1.702 m), weight 125.7 kg, last menstrual period 08/10/2023, SpO2 100%. Vitals:   03/26/24 2032 03/26/24 2340 03/27/24 0324 03/27/24 0817  BP: (!) 150/78 (!) 147/74 127/72 (!) 148/84  Pulse: 74 72 (!) 58 68  Resp: 18 18 16 17   Temp: 98.9 F (37.2 C) 98.1 F (36.7 C)  98.5 F (36.9 C)  TempSrc: Oral Oral  Oral  SpO2: 100%  100% 100%  Weight:      Height:       Physical Examination:  General appearance - alert, well appearing, and in no distress Mental status - normal mood, behavior, speech, dress, motor activity, and thought processes Chest - CTAB Heart - normal rate and regular rhythm Abdomen - gravid, soft and non-tender, no rebound or guarding Musculoskeletal - no edema or calf tenderness Extremities - no edema or calf tenderness bilaterally, no clonus Skin - warm and dry   Fetal Monitoring:  Baseline: 125 bpm, Variability: moderate, Accelerations: +15x 15 x 2, and Decelerations: Absent    reactive  Labs:  Results for orders placed or performed during the hospital encounter of 03/23/24 (from the past 24 hours)  Comprehensive metabolic panel   Collection Time: 03/27/24  4:33 AM  Result Value Ref Range   Sodium 134 (L) 135 - 145 mmol/L   Potassium 4.3  3.5 - 5.1 mmol/L   Chloride 108 98 - 111 mmol/L   CO2 20 (L) 22 - 32 mmol/L   Glucose, Bld 80 70 - 99 mg/dL   BUN 11 6 - 20 mg/dL   Creatinine, Ser 9.18 0.44 - 1.00 mg/dL   Calcium  8.4 (L) 8.9 - 10.3 mg/dL   Total Protein 5.9 (L) 6.5 - 8.1 g/dL   Albumin 2.4 (L) 3.5 - 5.0 g/dL   AST 18 15 - 41 U/L   ALT 15 0 - 44 U/L   Alkaline Phosphatase 94 38 - 126 U/L   Total Bilirubin 0.5 0.0 - 1.2 mg/dL   GFR, Estimated >39 >39 mL/min   Anion gap 6 5 - 15  CBC   Collection Time: 03/27/24  4:33 AM  Result Value Ref Range   WBC 13.4 (H) 4.0 - 10.5 K/uL   RBC 3.27 (L) 3.87 - 5.11 MIL/uL   Hemoglobin 10.2 (L) 12.0 - 15.0 g/dL   HCT 69.0 (L) 63.9 - 53.9 %   MCV 94.5 80.0 - 100.0 fL   MCH 31.2 26.0 - 34.0 pg   MCHC 33.0 30.0 - 36.0 g/dL   RDW 86.5 88.4 - 84.4 %   Platelets 108 (L) 150 - 400 K/uL   nRBC 0.0 0.0 - 0.2 %    Imaging Studies:    US  MFM OB FOLLOW UP Result Date: 03/23/2024 ----------------------------------------------------------------------  OBSTETRICS REPORT                       (  Signed Final 03/23/2024 04:01 pm) ---------------------------------------------------------------------- Patient Info  ID #:       [id number]                          D.O.B.:  [birth year] (43 yrs)(F)  Name:       ROSINA FORBES KLUVER                Visit Date: 03/23/2024 01:12 pm ---------------------------------------------------------------------- Performed By  Attending:        Fredia Fresh MD        Ref. Address:     [address]                                                             [address]                                                             [place]                                                             [address]  Performed By:     Jonette Nap        [facility]                                  [facility]  Referred By:      Marlis Leash                    OB GYN ---------------------------------------------------------------------- Orders  #  Description                           Code        Ordered By  1  US  MFM OB FOLLOW UP  23183.98    YU FANG  2  US  MFM FETAL BPP WO NON               76819.01    YU FANG     STRESS  3  US  MFM UA CORD DOPPLER                76820.02    YU FANG ----------------------------------------------------------------------  #  Order #                     Accession #                Episode #  1  [id number]                   [id number]                 [id number]  2  [id number]                   [id number]                 [id number]  3  [id number]                   [id number]                 [id number] ---------------------------------------------------------------------- Indications  Advanced maternal age multigravida 25+,        O37.523  third trimester  Elevated blood pressure affecting pregnancy    O13.3  in third trimester  Obesity complicating pregnancy (BMI 40)        O99.210 E66.9  Tobacco use complicating pregnancy, third      O99.333  trimester  Medical complication of pregnancy (NSTEMI)     O26.90  Previous cesarean delivery, antepartum x 3     O34.219  Encounter for other antenatal screening        Z36.2  follow-up  [redacted] weeks gestation of pregnancy                Z3A.32 ---------------------------------------------------------------------- Fetal Evaluation  Num Of Fetuses:         1  Fetal Heart Rate(bpm):  132  Cardiac Activity:       Observed  Presentation:           Cephalic  Placenta:               Anterior  P. Cord Insertion:      Previously seen  Amniotic Fluid  AFI FV:      Within normal limits  AFI Sum(cm)     %Tile       Largest Pocket(cm)  13.17           41          4.42  RUQ(cm)       RLQ(cm)       LUQ(cm)        LLQ(cm)  4.42          2.18          3.41           3.16 ----------------------------------------------------------------------  Biophysical Evaluation  Amniotic F.V:   Pocket => 2 cm             F. Tone:        Observed  F. Movement:    Observed  Score:          6/8  F. Breathing:   Not Observed ---------------------------------------------------------------------- Biometry  BPD:      76.6  mm     G. Age:  30w 5d          7  %    CI:         73.9   %    70 - 86                                                          FL/HC:      20.2   %    19.1 - 21.3  HC:       283   mm     G. Age:  31w 0d        2.2  %    HC/AC:      1.03        0.96 - 1.17  AC:      275.8  mm     G. Age:  31w 5d         30  %    FL/BPD:     74.7   %    71 - 87  FL:       57.2  mm     G. Age:  30w 0d        2.1  %    FL/AC:      20.7   %    20 - 24  Est. FW:    1680  gm    3 lb 11 oz      10  % ---------------------------------------------------------------------- OB History  Gravidity:    4         Term:   3        Prem:   0        SAB:   0  TOP:          0       Ectopic:  0        Living: 3 ---------------------------------------------------------------------- Gestational Age  LMP:           32w 2d        Date:  08/10/23                   EDD:   05/16/24  U/S Today:     30w 6d                                        EDD:   05/26/24  Best:          bobbye 2d     Det. By:  LMP  (08/10/23)          EDD:   05/16/24 ---------------------------------------------------------------------- Anatomy  Cranium:               Previously seen        Aortic Arch:            Previously seen  Cavum:                 Previously seen        Ductal Arch:  Previously seen  Ventricles:            Appears normal         Diaphragm:              Appears normal  Choroid Plexus:        Previously seen        Stomach:                Appears normal, left                                                                        sided  Cerebellum:            Previously seen        Abdomen:                Previously seen  Posterior Fossa:       Previously seen        Abdominal Wall:          Previously seen  Face:                  Orbits and profile     Cord Vessels:           Previously seen                         previously seen  Lips:                  Previously seen        Kidneys:                Appear normal  Thoracic:              Previously seen        Bladder:                Appears normal  Heart:                 Appears normal         Spine:                  Previously seen                         (4CH, axis, and                         situs)  RVOT:                  Previously seen        Upper Extremities:      Previously seen  LVOT:                  Previously seen        Lower Extremities:      Previously seen  Other:  Fetal anatomic survey complete on prior scans. ---------------------------------------------------------------------- Doppler - Fetal Vessels  Umbilical Artery   S/D     %tile      RI    %tile      PI    %tile  PSV    ADFV    RDFV                                                     (cm/s)   2.67       51    0.63       60    0.94       59    41.67      No      No ---------------------------------------------------------------------- Impression  Patient is here for fetal growth assessment and antenatal  testing.  She was discharged from [facility] practice after  several attempts to communicate with her failed. She has not  had prenatal care for about 2 months.  She has not had  screening for gestational diabetes.  Past medical history is significant for NSTEMI in August  2023.  Patient recovered well and had normal ejection  fraction on echocardiography.  She does not have any  cardiovascular symptoms including shortness of breath or  chest pain now.  Blood pressures today at our office were 162/87 and repeat  139/94 mmHg.  Obstetrical history is significant for 3 term vaginal deliveries.  Ultrasound  The estimated fetal weight is at the 10th percentile and the  abdominal circumference measurement at the 30th  percentile.  Normal amniotic fluid.  Fetal breathing  movements  did not meet the criteria BPP.  Umbilical artery  Doppler showed normal forward diastolic flow.  BPP 6/8.  History of myocardial infarction  Patient likely to be in modified WHO Class III because of a  history of myocardial infarction.  She has a high risk for  significant maternal morbidity.  She had missed her cardio  obstetric clinic appointments.  If echocardiographic  abnormalities are present, she may be in [facility] Class IV.  She is at high risk for cardiac failure and arrhythmias.  I counseled the patient that she needs to be evaluated by our  cardiologist.  Hypertension in pregnancy  I discussed the possibility of gestational hypertension  complicating her pregnancy.  I discussed the possible  complications including preeclampsia, maternal  complications including pulmonary edema renal failure and  coagulation disturbances.  I recommended that the patient be evaluated at the MAU with  series of blood pressures and if possible cardiology  consultation.  I discussed with Dr. Cleatus, who will evaluate the patient in  the hospital. ---------------------------------------------------------------------- Recommendations  -Evaluation at the MAU with possible admission and  cardiology consultation.  - Continue weekly antenatal testing till delivery. ----------------------------------------------------------------------                  Fredia Fresh, MD Electronically Signed Final Report   03/23/2024 04:01 pm ----------------------------------------------------------------------   US  MFM FETAL BPP WO NON STRESS Result Date: 03/23/2024 ----------------------------------------------------------------------  OBSTETRICS REPORT                       (Signed Final 03/23/2024 04:01 pm) ---------------------------------------------------------------------- Patient Info  ID #:       [id number]                          D.O.B.:  09/28/1980 (43 yrs)(F)  Name:       ROSINA FORBES KLUVER  Visit Date: 03/23/2024 01:12 pm  ---------------------------------------------------------------------- Performed By  Attending:        Fredia Fresh MD        Ref. Address:     [address]                                                             [address]                                                             [place]                                                             [address]  Performed By:     Jonette Nap        [facility]                                  [facility]  Referred By:      [facility] ---------------------------------------------------------------------- Orders  #  Description                           Code        Ordered By  1  US  MFM OB FOLLOW UP                   76816.01    YU FANG  2  US  MFM FETAL BPP WO NON               76819.01    YU FANG     STRESS  3  US  MFM UA CORD DOPPLER                23179.97    YU FANG ----------------------------------------------------------------------  #  Order #                     Accession #                Episode #  1  [id number]                   [id number]                 [id number]  2  [id number]                   [id number]                 [id number]  3  [id number]                   [id number]                 [id number] ---------------------------------------------------------------------- Indications  Advanced maternal age multigravida 14+,        O110.523  third trimester  Elevated blood pressure affecting pregnancy    O13.3  in third trimester  Obesity complicating pregnancy (BMI 40)        O99.210 E66.9  Tobacco use complicating pregnancy, third      O99.333  trimester  Medical complication of pregnancy (NSTEMI)     O26.90  Previous cesarean delivery, antepartum x 3     O34.219  Encounter for other antenatal screening        Z36.2  follow-up  [redacted]  weeks gestation of pregnancy                Z3A.32 ---------------------------------------------------------------------- Fetal Evaluation  Num Of Fetuses:         1  Fetal Heart Rate(bpm):  132  Cardiac Activity:       Observed  Presentation:           Cephalic  Placenta:               Anterior  P. Cord Insertion:      Previously seen  Amniotic Fluid  AFI FV:      Within normal limits  AFI Sum(cm)     %Tile       Largest Pocket(cm)  13.17           41          4.42  RUQ(cm)       RLQ(cm)       LUQ(cm)        LLQ(cm)  4.42          2.18          3.41           3.16 ---------------------------------------------------------------------- Biophysical Evaluation  Amniotic F.V:   Pocket => 2 cm             F. Tone:        Observed  F. Movement:    Observed                   Score:          6/8  F. Breathing:   Not Observed ---------------------------------------------------------------------- Biometry  BPD:      76.6  mm     G. Age:  30w 5d          7  %    CI:         73.9   %    70 - 86  FL/HC:      20.2   %    19.1 - 21.3  HC:       283   mm     G. Age:  31w 0d        2.2  %    HC/AC:      1.03        0.96 - 1.17  AC:      275.8  mm     G. Age:  31w 5d         30  %    FL/BPD:     74.7   %    71 - 87  FL:       57.2  mm     G. Age:  30w 0d        2.1  %    FL/AC:      20.7   %    20 - 24  Est. FW:    1680  gm    3 lb 11 oz      10  % ---------------------------------------------------------------------- OB History  Gravidity:    4         Term:   3        Prem:   0        SAB:   0  TOP:          0       Ectopic:  0        Living: 3 ---------------------------------------------------------------------- Gestational Age  LMP:           32w 2d        Date:  08/10/23                   EDD:   05/16/24  U/S Today:     30w 6d                                        EDD:   05/26/24  Best:          bobbye 2d     Det. By:  LMP  (08/10/23)          EDD:   05/16/24  ---------------------------------------------------------------------- Anatomy  Cranium:               Previously seen        Aortic Arch:            Previously seen  Cavum:                 Previously seen        Ductal Arch:            Previously seen  Ventricles:            Appears normal         Diaphragm:              Appears normal  Choroid Plexus:        Previously seen        Stomach:                Appears normal, left  sided  Cerebellum:            Previously seen        Abdomen:                Previously seen  Posterior Fossa:       Previously seen        Abdominal Wall:         Previously seen  Face:                  Orbits and profile     Cord Vessels:           Previously seen                         previously seen  Lips:                  Previously seen        Kidneys:                Appear normal  Thoracic:              Previously seen        Bladder:                Appears normal  Heart:                 Appears normal         Spine:                  Previously seen                         (4CH, axis, and                         situs)  RVOT:                  Previously seen        Upper Extremities:      Previously seen  LVOT:                  Previously seen        Lower Extremities:      Previously seen  Other:  Fetal anatomic survey complete on prior scans. ---------------------------------------------------------------------- Doppler - Fetal Vessels  Umbilical Artery   S/D     %tile      RI    %tile      PI    %tile     PSV    ADFV    RDFV                                                     (cm/s)   2.67       51    0.63       60    0.94       59    41.67      No      No ---------------------------------------------------------------------- Impression  Patient is here for fetal growth assessment and antenatal  testing.  She was discharged from [facility] practice after  several attempts to communicate with her failed. She has not   had prenatal care for about 2  months.  She has not had  screening for gestational diabetes.  Past medical history is significant for NSTEMI in August  2023.  Patient recovered well and had normal ejection  fraction on echocardiography.  She does not have any  cardiovascular symptoms including shortness of breath or  chest pain now.  Blood pressures today at our office were 162/87 and repeat  139/94 mmHg.  Obstetrical history is significant for 3 term vaginal deliveries.  Ultrasound  The estimated fetal weight is at the 10th percentile and the  abdominal circumference measurement at the 30th  percentile.  Normal amniotic fluid.  Fetal breathing  movements did not meet the criteria BPP.  Umbilical artery  Doppler showed normal forward diastolic flow.  BPP 6/8.  History of myocardial infarction  Patient likely to be in modified WHO Class III because of a  history of myocardial infarction.  She has a high risk for  significant maternal morbidity.  She had missed her cardio  obstetric clinic appointments.  If echocardiographic  abnormalities are present, she may be in [facility] Class IV.  She is at high risk for cardiac failure and arrhythmias.  I counseled the patient that she needs to be evaluated by our  cardiologist.  Hypertension in pregnancy  I discussed the possibility of gestational hypertension  complicating her pregnancy.  I discussed the possible  complications including preeclampsia, maternal  complications including pulmonary edema renal failure and  coagulation disturbances.  I recommended that the patient be evaluated at the MAU with  series of blood pressures and if possible cardiology  consultation.  I discussed with Dr. Cleatus, who will evaluate the patient in  the hospital. ---------------------------------------------------------------------- Recommendations  -Evaluation at the MAU with possible admission and  cardiology consultation.  - Continue weekly antenatal testing till delivery.  ----------------------------------------------------------------------                  Fredia Fresh, MD Electronically Signed Final Report   03/23/2024 04:01 pm ----------------------------------------------------------------------   US  MFM UA CORD DOPPLER Result Date: 03/23/2024 ----------------------------------------------------------------------  OBSTETRICS REPORT                       (Signed Final 03/23/2024 04:01 pm) ---------------------------------------------------------------------- Patient Info  ID #:       [id number]                          D.O.B.:  1981/05/13 (43 yrs)(F)  Name:       ROSINA FORBES KLUVER                Visit Date: 03/23/2024 01:12 pm ---------------------------------------------------------------------- Performed By  Attending:        Fredia Fresh MD        Ref. Address:     [address]                                                             [address]  [place]                                                             [address]  Performed By:     Jonette Nap        [facility]                                  [facility]  Referred By:      [facility] ---------------------------------------------------------------------- Orders  #  Description                           Code        Ordered By  1  US  MFM OB FOLLOW UP                   [id number]    YU FANG  2  US  MFM FETAL BPP WO NON               76819.01    YU FANG     STRESS  3  US  MFM UA CORD DOPPLER                76820.02    YU FANG ----------------------------------------------------------------------  #  Order #                     Accession #                Episode #  1  [id number]                   [id number]                 [id number]  2   [id number]                   [id number]                 [id number]  3  [id number]                   [id number]  [id number] ---------------------------------------------------------------------- Indications  Advanced maternal age multigravida 86+,        O37.523  third trimester  Elevated blood pressure affecting pregnancy    O13.3  in third trimester  Obesity complicating pregnancy (BMI 40)        O99.210 E66.9  Tobacco use complicating pregnancy, third      O99.333  trimester  Medical complication of pregnancy (NSTEMI)     O26.90  Previous cesarean delivery, antepartum x 3     O34.219  Encounter for other antenatal screening        Z36.2  follow-up  [redacted] weeks gestation of pregnancy                Z3A.32 ---------------------------------------------------------------------- Fetal Evaluation  Num Of Fetuses:         1  Fetal Heart Rate(bpm):  132  Cardiac Activity:       Observed  Presentation:           Cephalic  Placenta:               Anterior  P. Cord Insertion:      Previously seen  Amniotic Fluid  AFI FV:      Within normal limits  AFI Sum(cm)     %Tile       Largest Pocket(cm)  13.17           41          4.42  RUQ(cm)       RLQ(cm)       LUQ(cm)        LLQ(cm)  4.42          2.18          3.41           3.16 ---------------------------------------------------------------------- Biophysical Evaluation  Amniotic F.V:   Pocket => 2 cm             F. Tone:        Observed  F. Movement:    Observed                   Score:          6/8  F. Breathing:   Not Observed ---------------------------------------------------------------------- Biometry  BPD:      76.6  mm     G. Age:  30w 5d          7  %    CI:         73.9   %    70 - 86                                                          FL/HC:      20.2   %    19.1 - 21.3  HC:       283   mm     G. Age:  31w 0d        2.2  %    HC/AC:      1.03        0.96 - 1.17  AC:      275.8  mm     G. Age:  31w 5d         30  %    FL/BPD:  74.7   %    71 - 87   FL:       57.2  mm     G. Age:  30w 0d        2.1  %    FL/AC:      20.7   %    20 - 24  Est. FW:    1680  gm    3 lb 11 oz      10  % ---------------------------------------------------------------------- OB History  Gravidity:    4         Term:   3        Prem:   0        SAB:   0  TOP:          0       Ectopic:  0        Living: 3 ---------------------------------------------------------------------- Gestational Age  LMP:           32w 2d        Date:  08/10/23                   EDD:   05/16/24  U/S Today:     30w 6d                                        EDD:   05/26/24  Best:          bobbye 2d     Det. By:  LMP  (08/10/23)          EDD:   05/16/24 ---------------------------------------------------------------------- Anatomy  Cranium:               Previously seen        Aortic Arch:            Previously seen  Cavum:                 Previously seen        Ductal Arch:            Previously seen  Ventricles:            Appears normal         Diaphragm:              Appears normal  Choroid Plexus:        Previously seen        Stomach:                Appears normal, left                                                                        sided  Cerebellum:            Previously seen        Abdomen:                Previously seen  Posterior Fossa:       Previously seen        Abdominal Wall:         Previously seen  Face:  Orbits and profile     Cord Vessels:           Previously seen                         previously seen  Lips:                  Previously seen        Kidneys:                Appear normal  Thoracic:              Previously seen        Bladder:                Appears normal  Heart:                 Appears normal         Spine:                  Previously seen                         (4CH, axis, and                         situs)  RVOT:                  Previously seen        Upper Extremities:      Previously seen  LVOT:                  Previously seen        Lower Extremities:       Previously seen  Other:  Fetal anatomic survey complete on prior scans. ---------------------------------------------------------------------- Doppler - Fetal Vessels  Umbilical Artery   S/D     %tile      RI    %tile      PI    %tile     PSV    ADFV    RDFV                                                     (cm/s)   2.67       51    0.63       60    0.94       59    41.67      No      No ---------------------------------------------------------------------- Impression  Patient is here for fetal growth assessment and antenatal  testing.  She was discharged from [facility] practice after  several attempts to communicate with her failed. She has not  had prenatal care for about 2 months.  She has not had  screening for gestational diabetes.  Past medical history is significant for NSTEMI in August  2023.  Patient recovered well and had normal ejection  fraction on echocardiography.  She does not have any  cardiovascular symptoms including shortness of breath or  chest pain now.  Blood pressures today at our office were 162/87 and repeat  139/94 mmHg.  Obstetrical history is significant for 3 term vaginal deliveries.  Ultrasound  The estimated fetal weight is at the 10th percentile and  the  abdominal circumference measurement at the 30th  percentile.  Normal amniotic fluid.  Fetal breathing  movements did not meet the criteria BPP.  Umbilical artery  Doppler showed normal forward diastolic flow.  BPP 6/8.  History of myocardial infarction  Patient likely to be in modified WHO Class III because of a  history of myocardial infarction.  She has a high risk for  significant maternal morbidity.  She had missed her cardio  obstetric clinic appointments.  If echocardiographic  abnormalities are present, she may be in [facility] Class IV.  She is at high risk for cardiac failure and arrhythmias.  I counseled the patient that she needs to be evaluated by our  cardiologist.  Hypertension in pregnancy  I discussed the possibility of  gestational hypertension  complicating her pregnancy.  I discussed the possible  complications including preeclampsia, maternal  complications including pulmonary edema renal failure and  coagulation disturbances.  I recommended that the patient be evaluated at the MAU with  series of blood pressures and if possible cardiology  consultation.  I discussed with Dr. Cleatus, who will evaluate the patient in  the hospital. ---------------------------------------------------------------------- Recommendations  -Evaluation at the MAU with possible admission and  cardiology consultation.  - Continue weekly antenatal testing till delivery. ----------------------------------------------------------------------                  Fredia Fresh, MD Electronically Signed Final Report   03/23/2024 04:01 pm ----------------------------------------------------------------------      ASSESSMENT: [id number] [redacted]w[redacted]d Estimated Date of Delivery: 05/16/24  Patient Active Problem List   Diagnosis Date Noted   Gestational thrombocytopenia 03/24/2024   Chronic hypertension with superimposed preeclampsia 03/24/2024   UTI in pregnancy 03/23/2024   Chronic hypertension affecting pregnancy 03/23/2024   Obesity affecting pregnancy, antepartum 01/16/2024   History of 3 cesarean sections 01/16/2024   AMA (advanced maternal age) multigravida 35+ 01/16/2024   CAD (coronary artery disease) 01/11/2022   Hypertension 01/11/2022   Tobacco abuse 01/11/2022   NSTEMI (non-ST elevated myocardial infarction) (HCC) 01/09/2022    PLAN: 1) cHTN with superimposed preE -s/p Mag -BP stable with procardia -gest thrombocytopenia noted- will continue to follow daily trend -pt asymptomatic  2) FWB -reactive NST -s/p BMZ 10/18-19 -continue growth q 3-4wks -last scan 10/17, borderline FGR 10% -continue daily monitoring -s/p NICU consult  3) Maternal care -CAD, EKG normal, last ECHO 2023 -prior C-section x 3, plan for repeat with IUD  placement -continue routine OB care   DISP: Plan for delivery on 10/29 or sooner if clinically indicated  Delon HERO Tillman Kazmierski 03/27/2024,12:57 PM

## 2024-03-28 ENCOUNTER — Inpatient Hospital Stay (HOSPITAL_COMMUNITY)

## 2024-03-28 DIAGNOSIS — Z3A33 33 weeks gestation of pregnancy: Secondary | ICD-10-CM

## 2024-03-28 DIAGNOSIS — O10913 Unspecified pre-existing hypertension complicating pregnancy, third trimester: Secondary | ICD-10-CM

## 2024-03-28 DIAGNOSIS — I351 Nonrheumatic aortic (valve) insufficiency: Secondary | ICD-10-CM | POA: Diagnosis not present

## 2024-03-28 LAB — CBC
HCT: 32.7 % — ABNORMAL LOW (ref 36.0–46.0)
Hemoglobin: 10.7 g/dL — ABNORMAL LOW (ref 12.0–15.0)
MCH: 31 pg (ref 26.0–34.0)
MCHC: 32.7 g/dL (ref 30.0–36.0)
MCV: 94.8 fL (ref 80.0–100.0)
Platelets: 110 K/uL — ABNORMAL LOW (ref 150–400)
RBC: 3.45 MIL/uL — ABNORMAL LOW (ref 3.87–5.11)
RDW: 13.2 % (ref 11.5–15.5)
WBC: 13 K/uL — ABNORMAL HIGH (ref 4.0–10.5)
nRBC: 0 % (ref 0.0–0.2)

## 2024-03-28 LAB — ECHOCARDIOGRAM COMPLETE
Area-P 1/2: 3.17 cm2
Calc EF: 67.8 %
Height: 67 in
S' Lateral: 2.45 cm
Single Plane A2C EF: 70 %
Single Plane A4C EF: 68.1 %
Weight: 4433.6 [oz_av]

## 2024-03-28 LAB — COMPREHENSIVE METABOLIC PANEL WITH GFR
ALT: 13 U/L (ref 0–44)
AST: 18 U/L (ref 15–41)
Albumin: 2.6 g/dL — ABNORMAL LOW (ref 3.5–5.0)
Alkaline Phosphatase: 103 U/L (ref 38–126)
Anion gap: 7 (ref 5–15)
BUN: 13 mg/dL (ref 6–20)
CO2: 18 mmol/L — ABNORMAL LOW (ref 22–32)
Calcium: 8.6 mg/dL — ABNORMAL LOW (ref 8.9–10.3)
Chloride: 107 mmol/L (ref 98–111)
Creatinine, Ser: 0.8 mg/dL (ref 0.44–1.00)
GFR, Estimated: >60 mL/min (ref >60)
Glucose, Bld: 72 mg/dL (ref 70–99)
Potassium: 4.5 mmol/L (ref 3.5–5.1)
Sodium: 132 mmol/L — ABNORMAL LOW (ref 135–145)
Total Bilirubin: 0.5 mg/dL (ref 0.0–1.2)
Total Protein: 6.2 g/dL — ABNORMAL LOW (ref 6.5–8.1)

## 2024-03-28 LAB — TYPE AND SCREEN
ABO/RH(D): O POS
Antibody Screen: NEGATIVE

## 2024-03-28 NOTE — Progress Notes (Signed)
  Echocardiogram 2D Echocardiogram has been performed.  [name] 03/28/2024, 8:57 AM

## 2024-03-28 NOTE — Progress Notes (Signed)
 Patient ID: [name], female   DOB: January 31, 1981, 42 y.o.   MRN: [id number] FACULTY PRACTICE ANTEPARTUM(COMPREHENSIVE) NOTE  [name] is a 43 y.o. [phone] at [redacted]w[redacted]d  who is admitted for chronic HTN with superimposed preeclampsia.    Fetal presentation is cephalic. Length of Stay:  5  Days  Date of admission:03/23/2024  Subjective: Patient is without complaints this morning. She denies headache, blurry vision or RUQ pain. She reports good fetal movement. She denies contractions, vaginal bleeding or leakage of fluid.  Vitals:  Blood pressure 124/81, pulse 74, temperature 97.8 F (36.6 C), temperature source Oral, resp. rate 16, height 5' 7 (1.702 m), weight 125.7 kg, last menstrual period 08/10/2023, SpO2 99%. Vitals:   03/27/24 1827 03/27/24 1937 03/27/24 2335 03/28/24 0331  BP: (!) 153/91 (!) 144/87 (!) 154/89 124/81  Pulse: 69 78 74 74  Resp: 17 18 17 16   Temp: 98.7 F (37.1 C) 97.6 F (36.4 C) 98.5 F (36.9 C) 97.8 F (36.6 C)  TempSrc: Oral Oral Oral Oral  SpO2: 100%  99%   Weight:      Height:       Physical Examination:  General appearance - alert, well appearing, and in no distress Mental status - normal mood, behavior, speech, dress, motor activity, and thought processes Chest - CTAB Heart - normal rate and regular rhythm Abdomen - gravid, soft and non-tender,  Extremities - no edema or calf tenderness bilaterally, no clonus Skin - warm and dry   Fetal Monitoring:  Baseline: 125 bpm, Variability: moderate, Accelerations: +15x 15, and Decelerations: Absent    Toco: no contractions  Labs:  Results for orders placed or performed during the hospital encounter of 03/23/24 (from the past 24 hours)  Type and screen [facility]   Collection Time: 03/28/24  4:25 AM  Result Value Ref Range   ABO/RH(D) O POS    Antibody Screen NEG    Sample Expiration      03/31/2024,2359 Performed at [facility], [address], [place], [place]  [address]   Comprehensive metabolic panel   Collection Time: 03/28/24  4:28 AM  Result Value Ref Range   Sodium 132 (L) 135 - 145 mmol/L   Potassium 4.5 3.5 - 5.1 mmol/L   Chloride 107 98 - 111 mmol/L   CO2 18 (L) 22 - 32 mmol/L   Glucose, Bld 72 70 - 99 mg/dL   BUN 13 6 - 20 mg/dL   Creatinine, Ser 9.19 0.44 - 1.00 mg/dL   Calcium  8.6 (L) 8.9 - 10.3 mg/dL   Total Protein 6.2 (L) 6.5 - 8.1 g/dL   Albumin 2.6 (L) 3.5 - 5.0 g/dL   AST 18 15 - 41 U/L   ALT 13 0 - 44 U/L   Alkaline Phosphatase 103 38 - 126 U/L   Total Bilirubin 0.5 0.0 - 1.2 mg/dL   GFR, Estimated >39 >39 mL/min   Anion gap 7 5 - 15  CBC   Collection Time: 03/28/24  4:28 AM  Result Value Ref Range   WBC 13.0 (H) 4.0 - 10.5 K/uL   RBC 3.45 (L) 3.87 - 5.11 MIL/uL   Hemoglobin 10.7 (L) 12.0 - 15.0 g/dL   HCT 67.2 (L) 63.9 - 53.9 %   MCV 94.8 80.0 - 100.0 fL   MCH 31.0 26.0 - 34.0 pg   MCHC 32.7 30.0 - 36.0 g/dL   RDW 86.7 88.4 - 84.4 %   Platelets 110 (L) 150 -  400 K/uL   nRBC 0.0 0.0 - 0.2 %    Imaging Studies:    US  MFM FETAL BPP WO NON STRESS Result Date: 03/27/2024 ----------------------------------------------------------------------  OBSTETRICS REPORT                        (Signed Final 03/27/2024 10:58 pm) ---------------------------------------------------------------------- Patient Info  ID #:       [id number]                          D.O.B.:  21-Oct-1980 (43 yrs)(F)  Name:       [name]                Visit Date: 03/27/2024 04:56 pm ---------------------------------------------------------------------- Performed By  Attending:        Nathanel Fetters      Ref. Address:      [address]                    [address]                                                              [address]                                                              [place]                                                              [address]  Performed By:     Powell Breen BS       Secondary Phy.:    [facility]                     RD[facility]                                                              Care  Referred By:      [place]       [facility] ---------------------------------------------------------------------- Orders  #  Description                           Code  Ordered By  1  US  MFM FETAL BPP WO NON               23180.98    JENNIFER OZAN     STRESS ----------------------------------------------------------------------  #  Order #                     Accession #                Episode #  1  [id number]                   [id number]                 [id number] ---------------------------------------------------------------------- Indications  Unspecified preeclampsia, third trimester       O14.93  Advanced maternal age multigravida 42+,         O20.523  third trimester  Elevated blood pressure affecting pregnancy     O13.3  in third trimester  Obesity complicating pregnancy (BMI 40)         O99.210 E66.9  Tobacco use complicating pregnancy, third       O99.333  trimester  Medical complication of pregnancy (NSTEMI)      O26.90  [redacted] weeks gestation of pregnancy                 Z3A.32  Previous cesarean delivery, antepartum x 3      O34.219  Encounter for other antenatal screening         Z36.2  follow-up ---------------------------------------------------------------------- Fetal Evaluation  Num Of Fetuses:          1  Fetal Heart Rate(bpm):   127  Cardiac Activity:        Observed  Presentation:            Cephalic  Placenta:                Anterior  P. Cord Insertion:       Previously seen  Amniotic Fluid  AFI FV:      Within normal limits  AFI Sum(cm)     %Tile       Largest Pocket(cm)  12.2            34          4.7  RUQ(cm)       RLQ(cm)       LUQ(cm)        LLQ(cm)  3.5           1.5           2.5            4.7 ---------------------------------------------------------------------- Biophysical Evaluation  Amniotic F.V:    Pocket => 2 cm             F. Tone:         Observed  F. Movement:    Observed                   Score:           8/8  F. Breathing:   Observed ---------------------------------------------------------------------- OB History  Gravidity:    4         Term:   3        Prem:   0        SAB:   0  TOP:          0       Ectopic:  0  Living: 3 ---------------------------------------------------------------------- Gestational Age  LMP:           32w 6d        Date:  08/10/23                   EDD:   05/16/24  Best:          bobbye 6d     Det. By:  LMP  (08/10/23)          EDD:   05/16/24 ---------------------------------------------------------------------- Anatomy  Cranium:               Appears normal         Stomach:                Appears normal, left                                                                        sided  Ventricles:            Appears normal         Kidneys:                Appear normal  Thoracic:              Appears normal         Bladder:                Appears normal  Diaphragm:             Appears normal ---------------------------------------------------------------------- Cervix Uterus Adnexa  Cervix  Not visualized (advanced GA >24wks)  Uterus  No abnormality visualized.  Right Ovary  Not visualized.  Left Ovary  Not visualized.  Cul De Sac  No free fluid seen.  Adnexa  No abnormality visualized ---------------------------------------------------------------------- Impression  Antenatal testing performed given elevated blood pressure.  The biophysical profile was 8/8 with good fetal movement and  amniotic fluid volume. ---------------------------------------------------------------------- Recommendations  Clinical correlation recommended. ----------------------------------------------------------------------               Nathanel Fetters, MD Electronically Signed Final Report   03/27/2024 10:58 pm ----------------------------------------------------------------------       ASSESSMENT: [id number] [redacted]w[redacted]d Estimated Date of Delivery: 05/16/24  Patient Active Problem List   Diagnosis Date Noted   Gestational thrombocytopenia 03/24/2024   Chronic hypertension with superimposed preeclampsia 03/24/2024   UTI in pregnancy 03/23/2024   Chronic hypertension affecting pregnancy 03/23/2024   Obesity affecting pregnancy, antepartum 01/16/2024   History of 3 cesarean sections 01/16/2024   AMA (advanced maternal age) multigravida 35+ 01/16/2024   CAD (coronary artery disease) 01/11/2022   Hypertension 01/11/2022   Tobacco abuse 01/11/2022   NSTEMI (non-ST elevated myocardial infarction) (HCC) 01/09/2022    PLAN: 1) cHTN with superimposed preE -s/p Mag -BP stable with procardia -gest thrombocytopenia noted, platelets now 110 - will continue to follow daily trend   2) FWB -reactive NST -s/p BMZ 10/18-19 and NICU consult -continue growth q 3-4wks -last scan 10/17, borderline FGR 10% - 10/21 BPP 8/8 -continue daily monitoring   3) Maternal care -CAD, EKG normal, last ECHO 2023 - Follow up inpatient ECHO -prior C-section x 3, plan for repeat with IUD placement on 10/29 -continue routine OB care  Continue  current antepartum care  Dontavion Noxon 03/28/2024,7:24 AM

## 2024-03-29 DIAGNOSIS — O113 Pre-existing hypertension with pre-eclampsia, third trimester: Secondary | ICD-10-CM

## 2024-03-29 DIAGNOSIS — O99113 Other diseases of the blood and blood-forming organs and certain disorders involving the immune mechanism complicating pregnancy, third trimester: Secondary | ICD-10-CM

## 2024-03-29 DIAGNOSIS — D696 Thrombocytopenia, unspecified: Secondary | ICD-10-CM

## 2024-03-29 NOTE — Progress Notes (Signed)
 FACULTY PRACTICE ANTEPARTUM PROGRESS NOTE  [name] is a 42 y.o. [phone] at [redacted]w[redacted]d who is admitted for chronic hypertension.  Estimated Date of Delivery: 05/16/24 Fetal presentation is cephalic.  Length of Stay:  6 Days. Admitted 03/23/2024  Subjective: Pt denies headache, visual cha ges and RUQ pain. Patient reports normal fetal movement.  She denies uterine contractions, denies bleeding and leaking of fluid per vagina.  Vitals:  Blood pressure 121/65, pulse 73, temperature 98.6 F (37 C), temperature source Oral, resp. rate 17, height 5' 7 (1.702 m), weight 125.7 kg, last menstrual period 08/10/2023, SpO2 100%. Physical Examination: CONSTITUTIONAL: Well-developed, obese, well-nourished female in no acute distress.  HENT:  Normocephalic, atraumatic, External right and left ear normal. Oropharynx is clear and moist EYES: Conjunctivae and EOM are normal.  NECK: Normal range of motion, supple, no masses. SKIN: Skin is warm and dry. No rash noted. Not diaphoretic. No erythema. No pallor. NEUROLGIC: Alert and oriented to person, place, and time. Normal reflexes, muscle tone coordination. No cranial nerve deficit noted. PSYCHIATRIC: Normal mood and affect. Normal behavior. Normal judgment and thought content. CARDIOVASCULAR: Normal heart rate noted, regular rhythm RESPIRATORY: Effort and breath sounds normal, no problems with respiration noted MUSCULOSKELETAL: Normal range of motion. No edema and no tenderness. ABDOMEN: Soft, nontender, nondistended, gravid. CERVIX: deferred   Results for orders placed or performed during the hospital encounter of 03/23/24 (from the past 48 hours)  Type and screen [facility]     Status: None   Collection Time: 03/28/24  4:25 AM  Result Value Ref Range   ABO/RH(D) O POS    Antibody Screen NEG    Sample Expiration      03/31/2024,2359 Performed at [facility], [address], [place], [place] [address]   Comprehensive  metabolic panel     Status: Abnormal   Collection Time: 03/28/24  4:28 AM  Result Value Ref Range   Sodium 132 (L) 135 - 145 mmol/L   Potassium 4.5 3.5 - 5.1 mmol/L   Chloride 107 98 - 111 mmol/L   CO2 18 (L) 22 - 32 mmol/L   Glucose, Bld 72 70 - 99 mg/dL    Comment: Glucose reference range applies only to samples taken after fasting for at least 8 hours.   BUN 13 6 - 20 mg/dL   Creatinine, Ser 9.19 0.44 - 1.00 mg/dL   Calcium  8.6 (L) 8.9 - 10.3 mg/dL   Total Protein 6.2 (L) 6.5 - 8.1 g/dL   Albumin 2.6 (L) 3.5 - 5.0 g/dL   AST 18 15 - 41 U/L   ALT 13 0 - 44 U/L   Alkaline Phosphatase 103 38 - 126 U/L   Total Bilirubin 0.5 0.0 - 1.2 mg/dL   GFR, Estimated >39 >39 mL/min    Comment: (NOTE) Calculated using the CKD-EPI Creatinine Equation (2021)    Anion gap 7 5 - 15    Comment: Performed at [facility], [address] [place], [place] [address]  CBC     Status: Abnormal   Collection Time: 03/28/24  4:28 AM  Result Value Ref Range   WBC 13.0 (H) 4.0 - 10.5 K/uL   RBC 3.45 (L) 3.87 - 5.11 MIL/uL   Hemoglobin 10.7 (L) 12.0 - 15.0 g/dL   HCT 67.2 (L) 63.9 - 53.9 %   MCV 94.8 80.0 - 100.0 fL   MCH 31.0 26.0 - 34.0 pg   MCHC 32.7 30.0 - 36.0 g/dL  RDW 13.2 11.5 - 15.5 %   Platelets 110 (L) 150 - 400 K/uL   nRBC 0.0 0.0 - 0.2 %    Comment: Performed at [facility], [address] [place], [place] [address]    I have reviewed the patient's current medications.  ASSESSMENT: Principal Problem:   Chronic hypertension affecting pregnancy Active Problems:   CAD (coronary artery disease)   Tobacco abuse   History of 3 cesarean sections   UTI in pregnancy   Gestational thrombocytopenia   Chronic hypertension with superimposed preeclampsia   PLAN: -CHTN with superimposed preeclampsia       -s/p Mag        -Bp stable on current regimen        -monitor thrombocytopenia  2.   FWB       -continue daily NST       - Tu/ Friday BPP  3.  Maternal care/Cardiac       -Spoke with Dr. Renford from cardiology.  She reviewed the recent  echo and noted it was fine.      - Due to hx of NSTEMI, anesthesia was made aware of upcoming c section.  They will review the chart, but no new interventions at this time.      - repeat c sectionon 04/05/24 with tubal ligation and progesterone IUD placement.  See separate note for rationale   Continue routine antenatal care.   Jerilynn Buddle, MD [facility] Faculty Attending, [facility] 03/29/2024 1:10 PM

## 2024-03-29 NOTE — Progress Notes (Signed)
 Spoke with patient regarding tubal ligation.  The rationale would be she is getting a 4 peat c section and the tubes  should be able to be accessed without possibly needing another surgery.  Also with her cardiac history more pregnancies may become more risky. Pt understands tubal ligation will be permanent.  Pt initially desired progesterone IUD due to a history of menorrhagia with irregular cycles.  Previous use of mirena IUD has decreased her uterine bleeding and made them more normal.  Without a progesterone IUD pt notes much heavier menses and passing clots.  Will also try to place progesterone IUD during c section as well.   Jerilynn Buddle, MD

## 2024-03-30 ENCOUNTER — Inpatient Hospital Stay (HOSPITAL_COMMUNITY)

## 2024-03-30 NOTE — Progress Notes (Addendum)
 FACULTY PRACTICE ANTEPARTUM PROGRESS NOTE  [name] is a 43 y.o. [phone] at [redacted]w[redacted]d who is admitted for chronic hypertension.  Estimated Date of Delivery: 05/16/24 Fetal presentation is cephalic.  Length of Stay:  7 Days. Admitted 03/23/2024  Subjective: Doing well without complaint. Denies HA, visual changes, CP/SOB, RUQ/epigastric pain Denies ctx, VB, LOF. +FM  Vitals:  Blood pressure (!) 149/74, pulse 71, temperature 98.1 F (36.7 C), temperature source Oral, resp. rate 16, height 5' 7 (1.702 m), weight 125.7 kg, last menstrual period 08/10/2023, SpO2 100%. Physical Examination: CONSTITUTIONAL: No acute distress. A&Ox3. Resting comfortably in bed CARDIOVASCULAR: Normal heart rate noted, regular rhythm RESPIRATORY: Effort normal, no problems with respiration noted ABDOMEN: Soft, nontender, nondistended, gravid. CERVIX: deferred  FHT 130bpm, moderate variability, 1 accel (otherwise 10x10), no decels - due for BPP (Ordered)  No results found for this or any previous visit (from the past 48 hours).  I have reviewed the patient's current medications.  ASSESSMENT: Principal Problem:   Chronic hypertension affecting pregnancy Active Problems:   CAD (coronary artery disease)   Tobacco abuse   History of 3 cesarean sections   UTI in pregnancy   Gestational thrombocytopenia   Chronic hypertension with superimposed preeclampsia   PLAN: CHTN with superimposed preeclampsia       -s/p Mag        -Bp stable on current regimen (procardia 60 daily)        -monitor thrombocytopenia - trending q72h   2.   FWB      -s/p BMZ, NICU      -continue daily NST      -Tu/ Friday BPP - ordered this evening, not yet complete  3.  Cardiac/Maternal care      -Spoke with Dr. Sheena from cardiology.  She reviewed the recent  echo and noted it was fine.      - Due to hx of NSTEMI, anesthesia was made aware of upcoming c section.  They will review the chart, but no new interventions at this  time.      - repeat c sectionon 04/05/24 with tubal ligation and progesterone IUD placement.  See separate note for rationale (10/23)  Continue routine antenatal care.  Kieth Carolin, MD Obstetrician & Gynecologist, [facility] for [employer], [facility]

## 2024-03-30 NOTE — Plan of Care (Signed)
  Problem: Fluid Volume: Goal: Peripheral tissue perfusion will improve Outcome: Progressing   Problem: Clinical Measurements: Goal: Complications related to disease process, condition or treatment will be avoided or minimized Outcome: Progressing   Problem: Clinical Measurements: Goal: Complications related to the disease process, condition or treatment will be avoided or minimized Outcome: Progressing   Problem: Health Behavior/Discharge Planning: Goal: Ability to manage health-related needs will improve Outcome: Progressing   Problem: Clinical Measurements: Goal: Ability to maintain clinical measurements within normal limits will improve Outcome: Progressing Goal: Will remain free from infection Outcome: Progressing Goal: Diagnostic test results will improve Outcome: Progressing Goal: Respiratory complications will improve Outcome: Progressing Goal: Cardiovascular complication will be avoided Outcome: Progressing

## 2024-03-30 NOTE — Progress Notes (Signed)
 Nutrition Assessment: Antenatal LOS > 7 days  Nutrition Recommendations: Continue Regular Diet Patient may order snacks TID from the menu and double protein portions with meals Continue prenatal vitamins Current diet prescription will provide for increased needs of pregnancy   43 year old patient, at 4 3/7 gestation. Admitted with Principal Problem:   Chronic hypertension affecting pregnancy,    CAD (coronary artery disease)  Patient with recent history of vitamin D deficiency, not currently taking supplement. Prenatal Vits ordered  Schedule for c/s on 04/05/24   Patient Active Problem List   Diagnosis Date Noted   Gestational thrombocytopenia 03/24/2024   Chronic hypertension with superimposed preeclampsia 03/24/2024   UTI in pregnancy 03/23/2024   Chronic hypertension affecting pregnancy 03/23/2024   Obesity affecting pregnancy, antepartum 01/16/2024   History of 3 cesarean sections 01/16/2024   AMA (advanced maternal age) multigravida 35+ 01/16/2024   CAD (coronary artery disease) 01/11/2022   Hypertension 01/11/2022   Tobacco abuse 01/11/2022   NSTEMI (non-ST elevated myocardial infarction) (HCC) 01/09/2022    No reported concerns with diet tolerance documented Last BM 10/21  Weight History: no pre-pregnancy weight in chart, Weight at 26 weeks 121.4 Kg, BMI 41.8 Weight gain to date 9 lbs  Estimated needs: 2600 -2800  Kcal/day 120 - 140 g protein/day >2.8 L fluid requirements   Labs and medications reviewed   Nutrition Dx: Increased nutrient needs r/t pregnancy and fetal growth requirements aeb [redacted] weeks gestation.  Patient is assessed at low nutritional risk. Consult Registered Dietitian if concerns arise.

## 2024-03-31 ENCOUNTER — Inpatient Hospital Stay (HOSPITAL_COMMUNITY)

## 2024-03-31 ENCOUNTER — Encounter (HOSPITAL_COMMUNITY): Payer: Self-pay | Admitting: Obstetrics and Gynecology

## 2024-03-31 ENCOUNTER — Inpatient Hospital Stay (HOSPITAL_COMMUNITY): Admitting: Certified Registered"

## 2024-03-31 ENCOUNTER — Encounter (HOSPITAL_COMMUNITY)
Admission: AD | Disposition: A | Discharge status: Home / Self Care | Payer: Self-pay | Source: Home / Self Care | Attending: Obstetrics and Gynecology

## 2024-03-31 ENCOUNTER — Encounter (HOSPITAL_COMMUNITY): Payer: Self-pay

## 2024-03-31 DIAGNOSIS — O34211 Maternal care for low transverse scar from previous cesarean delivery: Secondary | ICD-10-CM

## 2024-03-31 DIAGNOSIS — O99214 Obesity complicating childbirth: Secondary | ICD-10-CM

## 2024-03-31 DIAGNOSIS — O133 Gestational [pregnancy-induced] hypertension without significant proteinuria, third trimester: Secondary | ICD-10-CM

## 2024-03-31 DIAGNOSIS — O09523 Supervision of elderly multigravida, third trimester: Secondary | ICD-10-CM

## 2024-03-31 DIAGNOSIS — Z3A33 33 weeks gestation of pregnancy: Secondary | ICD-10-CM

## 2024-03-31 DIAGNOSIS — Z98891 History of uterine scar from previous surgery: Secondary | ICD-10-CM

## 2024-03-31 DIAGNOSIS — E669 Obesity, unspecified: Secondary | ICD-10-CM

## 2024-03-31 DIAGNOSIS — O1493 Unspecified pre-eclampsia, third trimester: Secondary | ICD-10-CM

## 2024-03-31 DIAGNOSIS — O368130 Decreased fetal movements, third trimester, not applicable or unspecified: Secondary | ICD-10-CM | POA: Diagnosis not present

## 2024-03-31 DIAGNOSIS — O1414 Severe pre-eclampsia complicating childbirth: Secondary | ICD-10-CM

## 2024-03-31 DIAGNOSIS — O99213 Obesity complicating pregnancy, third trimester: Secondary | ICD-10-CM

## 2024-03-31 DIAGNOSIS — O99334 Smoking (tobacco) complicating childbirth: Secondary | ICD-10-CM

## 2024-03-31 DIAGNOSIS — O9912 Other diseases of the blood and blood-forming organs and certain disorders involving the immune mechanism complicating childbirth: Secondary | ICD-10-CM

## 2024-03-31 LAB — CBC
HCT: 32.7 % — ABNORMAL LOW (ref 36.0–46.0)
Hemoglobin: 10.8 g/dL — ABNORMAL LOW (ref 12.0–15.0)
MCH: 31.2 pg (ref 26.0–34.0)
MCHC: 33 g/dL (ref 30.0–36.0)
MCV: 94.5 fL (ref 80.0–100.0)
Platelets: 113 K/uL — ABNORMAL LOW (ref 150–400)
RBC: 3.46 MIL/uL — ABNORMAL LOW (ref 3.87–5.11)
RDW: 13.2 % (ref 11.5–15.5)
WBC: 10.3 K/uL (ref 4.0–10.5)
nRBC: 0 % (ref 0.0–0.2)

## 2024-03-31 LAB — COMPREHENSIVE METABOLIC PANEL WITH GFR
ALT: 12 U/L (ref 0–44)
AST: 16 U/L (ref 15–41)
Albumin: 2.4 g/dL — ABNORMAL LOW (ref 3.5–5.0)
Alkaline Phosphatase: 94 U/L (ref 38–126)
Anion gap: 9 (ref 5–15)
BUN: 13 mg/dL (ref 6–20)
CO2: 20 mmol/L — ABNORMAL LOW (ref 22–32)
Calcium: 8.6 mg/dL — ABNORMAL LOW (ref 8.9–10.3)
Chloride: 102 mmol/L (ref 98–111)
Creatinine, Ser: 0.88 mg/dL (ref 0.44–1.00)
GFR, Estimated: >60 mL/min (ref >60)
Glucose, Bld: 71 mg/dL (ref 70–99)
Potassium: 4.8 mmol/L (ref 3.5–5.1)
Sodium: 131 mmol/L — ABNORMAL LOW (ref 135–145)
Total Bilirubin: 0.4 mg/dL (ref 0.0–1.2)
Total Protein: 5.8 g/dL — ABNORMAL LOW (ref 6.5–8.1)

## 2024-03-31 LAB — TYPE AND SCREEN
ABO/RH(D): O POS
Antibody Screen: NEGATIVE

## 2024-03-31 SURGERY — Surgical Case
Anesthesia: Spinal

## 2024-03-31 MED ORDER — KETOROLAC TROMETHAMINE 30 MG/ML IJ SOLN: 30.0000 mg | Freq: Four times a day (QID) | INTRAMUSCULAR | Status: AC | PRN

## 2024-03-31 MED ORDER — PHENYLEPHRINE HCL-NACL 20-0.9 MG/250ML-% IV SOLN
INTRAVENOUS | Status: DC | PRN
  Administered 2024-03-31: 60 ug/min via INTRAVENOUS

## 2024-03-31 MED ORDER — NALOXONE HCL 0.4 MG/ML IJ SOLN: 0.4000 mg | INTRAMUSCULAR | Status: DC | PRN

## 2024-03-31 MED ORDER — ONDANSETRON HCL 4 MG/2ML IJ SOLN
INTRAMUSCULAR | Status: AC
  Filled 2024-03-31: qty 2

## 2024-03-31 MED ORDER — ACETAMINOPHEN 10 MG/ML IV SOLN
INTRAVENOUS | Status: DC | PRN
  Administered 2024-03-31: 1000 mg via INTRAVENOUS

## 2024-03-31 MED ORDER — LEVONORGESTREL 20 MCG/DAY IU IUD
INTRAUTERINE_SYSTEM | INTRAUTERINE | Status: AC
  Filled 2024-03-31: qty 1

## 2024-03-31 MED ORDER — SOD CITRATE-CITRIC ACID 500-334 MG/5ML PO SOLN
ORAL | Status: AC
  Filled 2024-03-31: qty 30

## 2024-03-31 MED ORDER — MORPHINE SULFATE (PF) 0.5 MG/ML IJ SOLN
INTRAMUSCULAR | Status: AC
  Filled 2024-03-31: qty 10

## 2024-03-31 MED ORDER — MENTHOL 3 MG MT LOZG: 1.0000 | LOZENGE | OROMUCOSAL | Status: DC | PRN

## 2024-03-31 MED ORDER — DEXAMETHASONE SOD PHOSPHATE PF 10 MG/ML IJ SOLN
INTRAMUSCULAR | Status: DC | PRN
  Administered 2024-03-31: 10 mg via INTRAVENOUS

## 2024-03-31 MED ORDER — LEVONORGESTREL 20 MCG/DAY IU IUD
1.0000 | INTRAUTERINE_SYSTEM | Freq: Once | INTRAUTERINE | Status: DC
  Filled 2024-03-31: qty 1

## 2024-03-31 MED ORDER — FENTANYL CITRATE (PF) 100 MCG/2ML IJ SOLN
INTRAMUSCULAR | Status: AC
  Filled 2024-03-31: qty 2

## 2024-03-31 MED ORDER — ENOXAPARIN SODIUM 60 MG/0.6ML IJ SOSY
60.0000 mg | PREFILLED_SYRINGE | INTRAMUSCULAR | Status: DC
  Administered 2024-04-01: 60 mg via SUBCUTANEOUS
  Filled 2024-03-31: qty 0.6

## 2024-03-31 MED ORDER — ACETAMINOPHEN 10 MG/ML IV SOLN
INTRAVENOUS | Status: AC
  Filled 2024-03-31: qty 100

## 2024-03-31 MED ORDER — IBUPROFEN 600 MG PO TABS
600.0000 mg | ORAL_TABLET | Freq: Four times a day (QID) | ORAL | Status: DC
  Administered 2024-04-01 – 2024-04-02 (×2): 600 mg via ORAL
  Filled 2024-03-31 (×2): qty 1

## 2024-03-31 MED ORDER — NIFEDIPINE 10 MG PO CAPS: 20.0000 mg | ORAL_CAPSULE | ORAL | Status: DC | PRN

## 2024-03-31 MED ORDER — MEASLES, MUMPS & RUBELLA VAC ~~LOC~~ SUSR: 0.5000 mL | Freq: Once | SUBCUTANEOUS | Status: DC

## 2024-03-31 MED ORDER — OXYTOCIN-SODIUM CHLORIDE 30-0.9 UT/500ML-% IV SOLN
INTRAVENOUS | Status: AC
  Filled 2024-03-31: qty 500

## 2024-03-31 MED ORDER — OXYTOCIN-SODIUM CHLORIDE 30-0.9 UT/500ML-% IV SOLN
INTRAVENOUS | Status: DC | PRN
  Administered 2024-03-31: 30 [IU] via INTRAVENOUS

## 2024-03-31 MED ORDER — OXYTOCIN-SODIUM CHLORIDE 30-0.9 UT/500ML-% IV SOLN: 2.5000 [IU]/h | INTRAVENOUS | Status: AC

## 2024-03-31 MED ORDER — MAGNESIUM HYDROXIDE 400 MG/5ML PO SUSP
30.0000 mL | ORAL | Status: DC | PRN
Start: 2024-03-31 — End: 2024-04-02

## 2024-03-31 MED ORDER — TRANEXAMIC ACID-NACL 1000-0.7 MG/100ML-% IV SOLN: 1000.0000 mg | Freq: Once | INTRAVENOUS | Status: DC

## 2024-03-31 MED ORDER — OXYCODONE HCL 5 MG PO TABS: 5.0000 mg | ORAL_TABLET | Freq: Once | ORAL | Status: DC | PRN

## 2024-03-31 MED ORDER — ONDANSETRON HCL 4 MG/2ML IJ SOLN
4.0000 mg | Freq: Three times a day (TID) | INTRAMUSCULAR | Status: DC | PRN
  Filled 2024-03-31: qty 2

## 2024-03-31 MED ORDER — ZOLPIDEM TARTRATE 5 MG PO TABS: 5.0000 mg | ORAL_TABLET | Freq: Every evening | ORAL | Status: DC | PRN

## 2024-03-31 MED ORDER — KETOROLAC TROMETHAMINE 30 MG/ML IJ SOLN
INTRAMUSCULAR | Status: AC
  Filled 2024-03-31: qty 1

## 2024-03-31 MED ORDER — TRANEXAMIC ACID-NACL 1000-0.7 MG/100ML-% IV SOLN
INTRAVENOUS | Status: DC | PRN
  Administered 2024-03-31: 1000 mg via INTRAVENOUS

## 2024-03-31 MED ORDER — DIBUCAINE (PERIANAL) 1 % EX OINT
1.0000 | TOPICAL_OINTMENT | CUTANEOUS | Status: DC | PRN
Start: 2024-03-31 — End: 2024-04-02

## 2024-03-31 MED ORDER — NALOXONE HCL 4 MG/10ML IJ SOLN: 1.0000 ug/kg/h | INTRAVENOUS | Status: DC | PRN

## 2024-03-31 MED ORDER — LACTATED RINGERS IV SOLN: INTRAVENOUS | Status: DC

## 2024-03-31 MED ORDER — DROPERIDOL 2.5 MG/ML IJ SOLN: 0.6250 mg | Freq: Once | INTRAMUSCULAR | Status: DC | PRN

## 2024-03-31 MED ORDER — LABETALOL HCL 5 MG/ML IV SOLN: 40.0000 mg | INTRAVENOUS | Status: DC | PRN

## 2024-03-31 MED ORDER — CEFAZOLIN SODIUM-DEXTROSE 3-4 GM/150ML-% IV SOLN
3.0000 g | INTRAVENOUS | Status: AC
  Administered 2024-03-31: 3 g via INTRAVENOUS
  Filled 2024-03-31: qty 150

## 2024-03-31 MED ORDER — PHENYLEPHRINE 80 MCG/ML (10ML) SYRINGE FOR IV PUSH (FOR BLOOD PRESSURE SUPPORT)
PREFILLED_SYRINGE | INTRAVENOUS | Status: DC | PRN
  Administered 2024-03-31 (×3): 80 ug via INTRAVENOUS

## 2024-03-31 MED ORDER — CEFAZOLIN SODIUM-DEXTROSE 3-4 GM/150ML-% IV SOLN
INTRAVENOUS | Status: AC
  Filled 2024-03-31: qty 150

## 2024-03-31 MED ORDER — LACTATED RINGERS IV SOLN: INTRAVENOUS | Status: DC | PRN

## 2024-03-31 MED ORDER — FERROUS SULFATE 325 (65 FE) MG PO TABS
325.0000 mg | ORAL_TABLET | ORAL | Status: DC
  Administered 2024-04-01: 325 mg via ORAL
  Filled 2024-03-31: qty 1

## 2024-03-31 MED ORDER — SOD CITRATE-CITRIC ACID 500-334 MG/5ML PO SOLN: 30.0000 mL | ORAL | Status: DC

## 2024-03-31 MED ORDER — MAGNESIUM SULFATE 40 GM/1000ML IV SOLN
2.0000 g/h | INTRAVENOUS | Status: AC
  Administered 2024-04-01: 2 g/h via INTRAVENOUS
  Filled 2024-03-31: qty 1000

## 2024-03-31 MED ORDER — DIPHENHYDRAMINE HCL 25 MG PO CAPS: 25.0000 mg | ORAL_CAPSULE | ORAL | Status: DC | PRN

## 2024-03-31 MED ORDER — OXYCODONE HCL 5 MG PO TABS: 5.0000 mg | ORAL_TABLET | ORAL | Status: DC | PRN

## 2024-03-31 MED ORDER — MAGNESIUM SULFATE BOLUS VIA INFUSION
6.0000 g | Freq: Once | INTRAVENOUS | Status: DC
  Filled 2024-03-31: qty 1000

## 2024-03-31 MED ORDER — PHENYLEPHRINE HCL-NACL 20-0.9 MG/250ML-% IV SOLN
INTRAVENOUS | Status: AC
  Filled 2024-03-31: qty 250

## 2024-03-31 MED ORDER — LACTATED RINGERS IV SOLN
INTRAVENOUS | Status: AC
Start: 2024-03-31 — End: 2024-04-01

## 2024-03-31 MED ORDER — OXYCODONE HCL 5 MG/5ML PO SOLN: 5.0000 mg | Freq: Once | ORAL | Status: DC | PRN

## 2024-03-31 MED ORDER — WITCH HAZEL-GLYCERIN EX PADS: 1.0000 | MEDICATED_PAD | CUTANEOUS | Status: DC | PRN

## 2024-03-31 MED ORDER — SENNOSIDES-DOCUSATE SODIUM 8.6-50 MG PO TABS
2.0000 | ORAL_TABLET | Freq: Every day | ORAL | Status: DC
  Administered 2024-04-01 – 2024-04-02 (×2): 2 via ORAL
  Filled 2024-03-31 (×2): qty 2

## 2024-03-31 MED ORDER — NIFEDIPINE 10 MG PO CAPS: 10.0000 mg | ORAL_CAPSULE | ORAL | Status: DC | PRN

## 2024-03-31 MED ORDER — KETOROLAC TROMETHAMINE 30 MG/ML IJ SOLN: 30.0000 mg | Freq: Once | INTRAMUSCULAR | Status: DC | PRN

## 2024-03-31 MED ORDER — SODIUM CHLORIDE 0.9% FLUSH: 3.0000 mL | INTRAVENOUS | Status: DC | PRN

## 2024-03-31 MED ORDER — PRENATAL MULTIVITAMIN CH: 1.0000 | ORAL_TABLET | Freq: Every day | ORAL | Status: DC

## 2024-03-31 MED ORDER — BUPIVACAINE IN DEXTROSE 0.75-8.25 % IT SOLN
INTRATHECAL | Status: DC | PRN
  Administered 2024-03-31: 1.8 mL via INTRATHECAL

## 2024-03-31 MED ORDER — MAGNESIUM SULFATE 40 GM/1000ML IV SOLN
INTRAVENOUS | Status: DC | PRN
  Administered 2024-03-31: 2 g/h via INTRAVENOUS
  Administered 2024-03-31: 4 g via INTRAVENOUS

## 2024-03-31 MED ORDER — FENTANYL CITRATE (PF) 100 MCG/2ML IJ SOLN
INTRAMUSCULAR | Status: DC | PRN
  Administered 2024-03-31: 15 ug via INTRATHECAL

## 2024-03-31 MED ORDER — KETOROLAC TROMETHAMINE 30 MG/ML IJ SOLN
30.0000 mg | Freq: Four times a day (QID) | INTRAMUSCULAR | Status: AC
  Administered 2024-04-01 (×4): 30 mg via INTRAVENOUS
  Filled 2024-03-31 (×4): qty 1

## 2024-03-31 MED ORDER — ONDANSETRON HCL 4 MG/2ML IJ SOLN
INTRAMUSCULAR | Status: DC | PRN
  Administered 2024-03-31: 4 mg via INTRAVENOUS

## 2024-03-31 MED ORDER — ACETAMINOPHEN 500 MG PO TABS
1000.0000 mg | ORAL_TABLET | Freq: Four times a day (QID) | ORAL | Status: DC
  Administered 2024-04-01 – 2024-04-02 (×6): 1000 mg via ORAL
  Filled 2024-03-31 (×6): qty 2

## 2024-03-31 MED ORDER — SIMETHICONE 80 MG PO CHEW: 80.0000 mg | CHEWABLE_TABLET | ORAL | Status: DC | PRN

## 2024-03-31 MED ORDER — GABAPENTIN 300 MG PO CAPS
300.0000 mg | ORAL_CAPSULE | Freq: Two times a day (BID) | ORAL | Status: DC
  Administered 2024-03-31 – 2024-04-02 (×4): 300 mg via ORAL
  Filled 2024-03-31 (×4): qty 1

## 2024-03-31 MED ORDER — DIPHENHYDRAMINE HCL 25 MG PO CAPS: 25.0000 mg | ORAL_CAPSULE | Freq: Four times a day (QID) | ORAL | Status: DC | PRN

## 2024-03-31 MED ORDER — DIPHENHYDRAMINE HCL 50 MG/ML IJ SOLN: 12.5000 mg | INTRAMUSCULAR | Status: DC | PRN

## 2024-03-31 MED ORDER — SODIUM CHLORIDE 0.9 % IR SOLN
Status: DC | PRN
  Administered 2024-03-31: 1

## 2024-03-31 MED ORDER — SCOPOLAMINE 1 MG/3DAYS TD PT72
1.0000 | MEDICATED_PATCH | Freq: Once | TRANSDERMAL | Status: DC
  Administered 2024-03-31: 1 mg via TRANSDERMAL
  Filled 2024-03-31: qty 1

## 2024-03-31 MED ORDER — FENTANYL CITRATE (PF) 100 MCG/2ML IJ SOLN: 25.0000 ug | INTRAMUSCULAR | Status: DC | PRN

## 2024-03-31 MED ORDER — HYDROMORPHONE HCL 2 MG PO TABS: 2.0000 mg | ORAL_TABLET | ORAL | Status: DC | PRN

## 2024-03-31 MED ORDER — KETOROLAC TROMETHAMINE 30 MG/ML IJ SOLN
INTRAMUSCULAR | Status: DC | PRN
  Administered 2024-03-31: 30 mg via INTRAVENOUS

## 2024-03-31 MED ORDER — MORPHINE SULFATE (PF) 0.5 MG/ML IJ SOLN
INTRAMUSCULAR | Status: DC | PRN
  Administered 2024-03-31: 150 ug via INTRATHECAL

## 2024-03-31 MED ORDER — COCONUT OIL OIL: 1.0000 | TOPICAL_OIL | Status: DC | PRN

## 2024-03-31 MED ORDER — TRANEXAMIC ACID-NACL 1000-0.7 MG/100ML-% IV SOLN
INTRAVENOUS | Status: AC
  Filled 2024-03-31: qty 100

## 2024-03-31 MED ORDER — ACETAMINOPHEN 500 MG PO TABS: 1000.0000 mg | ORAL_TABLET | Freq: Four times a day (QID) | ORAL | Status: DC

## 2024-03-31 SURGICAL SUPPLY — 37 items
BENZOIN TINCTURE PRP APPL 2/3 (GAUZE/BANDAGES/DRESSINGS) IMPLANT
CHLORAPREP W/TINT 26 (MISCELLANEOUS) ×2 IMPLANT
CLAMP UMBILICAL CORD (MISCELLANEOUS) ×1 IMPLANT
CLOTH BEACON ORANGE TIMEOUT ST (SAFETY) ×1 IMPLANT
DERMABOND ADVANCED .7 DNX12 (GAUZE/BANDAGES/DRESSINGS) IMPLANT
DRSG OPSITE POSTOP 4X10 (GAUZE/BANDAGES/DRESSINGS) ×1 IMPLANT
ELECTRODE REM PT RTRN 9FT ADLT (ELECTROSURGICAL) ×1 IMPLANT
EXTRACTOR VACUUM M CUP 4 TUBE (SUCTIONS) IMPLANT
GAUZE PAD ABD 7.5X8 STRL (GAUZE/BANDAGES/DRESSINGS) IMPLANT
GAUZE SPONGE 4X4 12PLY STRL LF (GAUZE/BANDAGES/DRESSINGS) IMPLANT
GLOVE BIOGEL PI IND STRL 7.0 (GLOVE) ×3 IMPLANT
GLOVE ECLIPSE 7.0 STRL STRAW (GLOVE) ×1 IMPLANT
GOWN STRL REUS W/TWL LRG LVL3 (GOWN DISPOSABLE) ×1 IMPLANT
GOWN STRL REUS W/TWL XL LVL3 (GOWN DISPOSABLE) ×1 IMPLANT
HEMOSTAT ARISTA ABSORB 3G PWDR (HEMOSTASIS) IMPLANT
KIT ABG SYR 3ML LUER SLIP (SYRINGE) IMPLANT
NDL HYPO 25X5/8 SAFETYGLIDE (NEEDLE) ×1 IMPLANT
NEEDLE HYPO 22GX1.5 SAFETY (NEEDLE) ×1 IMPLANT
NEEDLE HYPO 25X5/8 SAFETYGLIDE (NEEDLE) ×1 IMPLANT
NS IRRIG 1000ML POUR BTL (IV SOLUTION) ×1 IMPLANT
PACK C SECTION WH (CUSTOM PROCEDURE TRAY) ×1 IMPLANT
PAD ABD 7.5X8 STRL (GAUZE/BANDAGES/DRESSINGS) ×1 IMPLANT
PAD OB MATERNITY 4.3X12.25 (PERSONAL CARE ITEMS) ×1 IMPLANT
RETAINER VISCERAL (MISCELLANEOUS) IMPLANT
RETRACTOR WND ALEXIS 25 LRG (MISCELLANEOUS) IMPLANT
RTRCTR C-SECT PINK 25CM LRG (MISCELLANEOUS) IMPLANT
STRIP CLOSURE SKIN 1/2X4 (GAUZE/BANDAGES/DRESSINGS) IMPLANT
SUT PDS AB 0 CTX 36 PDP370T (SUTURE) IMPLANT
SUT VIC AB 0 CTX36XBRD ANBCTRL (SUTURE) ×2 IMPLANT
SUT VIC AB 2-0 CT1 (SUTURE) IMPLANT
SUT VIC AB 4-0 KS 27 (SUTURE) ×1 IMPLANT
SUTURE PLAIN GUT 2.0 ETHICON (SUTURE) IMPLANT
SYR CONTROL 10ML LL (SYRINGE) ×1 IMPLANT
TAPE CLOTH SURG 4X10 WHT LF (GAUZE/BANDAGES/DRESSINGS) IMPLANT
TOWEL OR 17X24 6PK STRL BLUE (TOWEL DISPOSABLE) ×1 IMPLANT
TRAY FOLEY W/BAG SLVR 14FR LF (SET/KITS/TRAYS/PACK) ×1 IMPLANT
WATER STERILE IRR 1000ML POUR (IV SOLUTION) ×1 IMPLANT

## 2024-03-31 NOTE — Op Note (Cosign Needed)
 Rosina FORBES Kluver PROCEDURE DATE: 03/31/2024  PREOPERATIVE DIAGNOSES: Intrauterine pregnancy at [redacted]w[redacted]d weeks gestation; severe preeclampsia  POSTOPERATIVE DIAGNOSES: The same  PROCEDURE: Repeat Low Transverse Cesarean Section  SURGEON:  Jennifer Ozan, DO  ASSISTANT:  Barkley Angles, MD  ANESTHESIOLOGY TEAM: Anesthesiologist: Erma Thom SAUNDERS, MD CRNA: Edelmiro Elenor NOVAK, CRNA Student Nurse Anesthetist: Pecolia Jayson SQUIBB, RN  INDICATIONS: IONA STAY is a 43 y.o. [phone] at 100w3d here for cesarean section secondary to the indications listed under preoperative diagnoses; please see preoperative note for further details.  The risks of cesarean section were discussed with the patient including but were not limited to: bleeding which may require transfusion or reoperation; infection which may require antibiotics; injury to bowel, bladder, ureters or other surrounding organs; injury to the fetus; need for additional procedures including hysterectomy in the event of a life-threatening hemorrhage; placental abnormalities wth subsequent pregnancies, incisional problems, thromboembolic phenomenon and other postoperative/anesthesia complications.   The patient concurred with the proposed plan, giving informed written consent for the procedure.    FINDINGS:  Viable female infant in cephalic presentation.  Apgars APGAR (1 MIN): 8  APGAR (5 MINS): 9  APGAR (10 MINS):  Clear amniotic fluid.  Intact placenta, three vessel cord.   Normal uterus, fallopian tubes and ovaries bilaterally.  ANESTHESIA: Spinal INTRAVENOUS FLUIDS: 800 ml   ESTIMATED BLOOD LOSS: 273 ml URINE OUTPUT:  100 ml SPECIMENS: Placenta sent to L&D  COMPLICATIONS: Patient in the OR, after spinal anesthesia administered and patient laid down on the table, appeared to have vasovagal syncope vs seizure episode that resolved within one minute. Fetal heart rate noted to be bradycardic during this episode with HR in 80's. FHR never recovered so  cesarean section upgraded from urgent to emergent.   PROCEDURE IN DETAIL:  The patient preoperatively received intravenous antibiotics and had sequential compression devices applied to her lower extremities. A foley catheter was placed into her bladder and attached to constant gravity.  She was then taken to the operating room where spinal anesthesia was administered. The epidural anesthesia was dosed up to surgical level and was found to be adequate. At this time patient had near syncopal vs seizure episode.  Skin cleaned with betadine and drape applied.  After an adequate timeout was performed, a Pfannenstiel skin incision was made with scalpel on her preexisting scar and carried through to the underlying layer of fascia. The fascia was incised in the midline, and this incision was extended bilaterally using the Mayo scissors.  Kocher clamps were applied to the superior aspect of the fascial incision and the underlying rectus muscles were dissected off bluntly and sharply.  A similar process was carried out on the inferior aspect of the fascial incision. The rectus muscles were separated in the midline and the peritoneum was entered bluntly. The Alexis self-retaining retractor was introduced into the abdominal cavity.  Attention was turned to the lower uterine segment where a low transverse hysterotomy was made with a scalpel and extended bilaterally bluntly.  The infant was successfully delivered in the typical fashion, the cord was clamped and cut after one minute, and the infant was handed over to the awaiting neonatology team. Uterine massage was then administered, and the placenta delivered intact with a three-vessel cord. The uterus was then cleared of clots and debris.  The hysterotomy was closed with 0 Vicryl in a running locked fashion, and an imbricating layer was also placed with 0 Vicryl.  Attention was then turned to the left fallopian tube.  Kelly forceps were placed on the mesosalpinx underneath  most of the tube.  This pedicle was double suture ligated with 2-0 Vicryl, and the tube including the fimbriated end was excised.  The right fallopian tube was then identified, doubly ligated, and was excised in a similar fashion allowing for bilateral tubal sterilization via bilateral salpingectomy.  Good hemostasis was noted overall. The pelvis was cleared of all clot and debris. Hemostasis was confirmed on all surfaces.  The retractor was removed. The fascia was then closed using 0 Vicryl in a running fashion.  The subcutaneous layer was irrigated, reapproximated with 2-0 plain gut running stitches, and the skin was closed with a 4-0 Vicryl subcuticular stitch. The patient tolerated the procedure well. Sponge, instrument and needle counts were correct x 3.  She was taken to the recovery room in stable condition.

## 2024-03-31 NOTE — Progress Notes (Addendum)
-   Reviewed care plan due to initial BPP of 4/8 -NST reactive, which makes BPP 6/10 -Reviewed care plan with Dr. Kizzie, agreeable the patient should remain on OB specialty care with continued plan as previously outlined  Loukisha Gunnerson, DO Attending Obstetrician & Gynecologist, Faculty Practice Center for [facility], [facility]  ADDENDUM @ 1640  Called to review FHT as there has been a change since above. Noted variable decels x2 with possible late component, now with minimal variability.  At this time, recommend proceeding towards delivery.  The risks of surgery were discussed with the patient including but were not limited to: bleeding which may require transfusion or reoperation; infection which may require antibiotics; injury to bowel, bladder, ureters or other surrounding organs; injury to the fetus; need for additional procedures including hysterectomy in the event of a life-threatening hemorrhage; formation of adhesions; placental abnormalities with subsequent pregnancies; incisional problems; thromboembolic phenomenon and other postoperative/anesthesia complications.   Also, pt desires sterilization. Other reversible forms of contraception were discussed with patient; she declines all other modalities though pt desires Mirena as alternative if possible and to regulate her menses. Risks of procedure discussed with patient including but not limited to: risk of regret, permanence of method, bleeding, infection, and potential injury to surrounding organs.  Discussed that especially in setting of salpingectomy, risk of ectopic pregnancy low, less than 1%.   Patient verbalized understanding of these risks and wants to proceed with sterilization.  Written informed consent obtained.  To OR when ready.  The patient concurred with the proposed plan, giving informed written consent for the procedure.   Patient has been NPO since this am. Anesthesia and OR aware. Preoperative  prophylactic antibiotics ordered.  Chaselyn Nanney, DO Attending Obstetrician & Gynecologist, [facility] for [employer], [facility]

## 2024-03-31 NOTE — Anesthesia Postprocedure Evaluation (Signed)
 Anesthesia Post Note  Patient: [name]  Procedure(s) Performed: CESAREAN DELIVERY     Patient location during evaluation: PACU Anesthesia Type: Spinal Level of consciousness: oriented and awake and alert Pain management: pain level controlled Vital Signs Assessment: post-procedure vital signs reviewed and stable Respiratory status: spontaneous breathing, respiratory function stable and patient connected to nasal cannula oxygen Cardiovascular status: blood pressure returned to baseline and stable Postop Assessment: no headache, no backache and no apparent nausea or vomiting Anesthetic complications: no   No notable events documented.  Last Vitals:  Vitals:   03/31/24 2050 03/31/24 2137  BP: (!) 148/80 (!) 151/81  Pulse:  74  Resp: 15   Temp: 36.6 C   SpO2: 96%     Last Pain:  Vitals:   03/31/24 2100  TempSrc:   PainSc: 0-No pain                 Thom [name]

## 2024-03-31 NOTE — Anesthesia Procedure Notes (Signed)
 Spinal  Patient location during procedure: OR Start time: 03/31/2024 5:08 PM End time: 03/31/2024 7:12 AM Reason for block: surgical anesthesia Staffing Performed: anesthesiologist  Anesthesiologist: Erma Thom SAUNDERS, MD Performed by: Erma Thom SAUNDERS, MD Authorized by: Erma Thom SAUNDERS, MD   Preanesthetic Checklist Completed: patient identified, IV checked, site marked, risks and benefits discussed, surgical consent, monitors and equipment checked, pre-op evaluation and timeout performed Spinal Block Patient position: sitting Prep: DuraPrep Patient monitoring: heart rate, cardiac monitor, continuous pulse ox and blood pressure Approach: midline Location: L4-5 Needle Needle type: Pencan  Needle gauge: 24 G Assessment Sensory level: T6 Additional Notes Functioning IV was confirmed and monitors were applied. Sterile prep and drape, including hand hygiene and sterile gloves were used. The patient was positioned and the spine was prepped. The skin was anesthetized with lidocaine .  Free flow of clear CSF was obtained prior to injecting local anesthetic into the CSF.  The spinal needle aspirated freely following injection.  The needle was carefully withdrawn.  The patient tolerated the procedure well.

## 2024-03-31 NOTE — Plan of Care (Signed)
  Problem: Fluid Volume: Goal: Peripheral tissue perfusion will improve Outcome: Progressing   Problem: Clinical Measurements: Goal: Complications related to disease process, condition or treatment will be avoided or minimized Outcome: Progressing   Problem: Clinical Measurements: Goal: Complications related to the disease process, condition or treatment will be avoided or minimized Outcome: Progressing   Problem: Health Behavior/Discharge Planning: Goal: Ability to manage health-related needs will improve Outcome: Progressing   Problem: Clinical Measurements: Goal: Ability to maintain clinical measurements within normal limits will improve Outcome: Progressing Goal: Will remain free from infection Outcome: Progressing Goal: Diagnostic test results will improve Outcome: Progressing Goal: Respiratory complications will improve Outcome: Progressing Goal: Cardiovascular complication will be avoided Outcome: Progressing

## 2024-03-31 NOTE — Anesthesia Preprocedure Evaluation (Addendum)
 Anesthesia Evaluation  Patient identified by MRN, date of birth, ID band Patient awake    Reviewed: Allergy & Precautions, H&P , NPO status , Patient's Chart, lab work & pertinent test results  History of Anesthesia Complications Negative for: history of anesthetic complications  Airway Mallampati: II  TM Distance: >3 FB Neck ROM: Full    Dental no notable dental hx.    Pulmonary neg COPD, former smoker   Pulmonary exam normal breath sounds clear to auscultation       Cardiovascular hypertension, + Past MI  Normal cardiovascular exam Rhythm:Regular Rate:Normal  Prior NSTEMI. LHC with mild non obstructive disease TTE with normal biventricular function   Neuro/Psych neg Seizures negative neurological ROS  negative psych ROS   GI/Hepatic negative GI ROS, Neg liver ROS,,,  Endo/Other  negative endocrine ROS    Renal/GU negative Renal ROS  negative genitourinary   Musculoskeletal negative musculoskeletal ROS (+)    Abdominal   Peds negative pediatric ROS (+)  Hematology  (+) Blood dyscrasia, anemia Plt 113 Hg 10.8   Anesthesia Other Findings 43 y.o. [id number] at [redacted]w[redacted]d. hx of CS x 3, AMA,   cHTN with superimposed severe pre-E  Reproductive/Obstetrics (+) Pregnancy                              Anesthesia Physical Anesthesia Plan  ASA: 3  Anesthesia Plan: Spinal   Post-op Pain Management: Ofirmev  IV (intra-op)*   Induction:   PONV Risk Score and Plan: 2 and Treatment may vary due to age or medical condition  Airway Management Planned: Natural Airway  Additional Equipment:   Intra-op Plan:   Post-operative Plan:   Informed Consent: I have reviewed the patients History and Physical, chart, labs and discussed the procedure including the risks, benefits and alternatives for the proposed anesthesia with the patient or authorized representative who has indicated his/her understanding  and acceptance.     Dental advisory given  Plan Discussed with: CRNA  Anesthesia Plan Comments:          Anesthesia Quick Evaluation

## 2024-03-31 NOTE — Transfer of Care (Signed)
 Immediate Anesthesia Transfer of Care Note  Patient: [name]  Procedure(s) Performed: CESAREAN DELIVERY  Patient Location: PACU  Anesthesia Type:Spinal  Level of Consciousness: awake, alert , and oriented  Airway & Oxygen Therapy: Patient Spontanous Breathing  Post-op Assessment: Report given to RN and Post -op Vital signs reviewed and stable  Post vital signs: Reviewed and stable  Last Vitals:  Vitals Value Taken Time  BP 144/87 03/31/24 18:55  Temp    Pulse    Resp 9 03/31/24 18:56  SpO2    Vitals shown include unfiled device data.  Last Pain:  Vitals:   03/31/24 1630  TempSrc: Oral  PainSc:          Complications: No notable events documented.

## 2024-03-31 NOTE — Progress Notes (Signed)
 FACULTY PRACTICE ANTEPARTUM PROGRESS NOTE  INOCENCIA MURTAUGH is a 43 y.o. [phone] at [redacted]w[redacted]d who is admitted for severe preeclampsia superimposed over [facility].  Estimated Date of Delivery: 05/16/24 Fetal presentation is cephalic.  Length of Stay:  8 Days. Admitted 03/23/2024  Subjective: Pt denies headache, visual changes and RUQ pain Patient reports normal fetal movement.  She denies uterine contractions, denies bleeding and leaking of fluid per vagina.  Vitals:  Blood pressure (!) 135/102, pulse 81, temperature 98 F (36.7 C), temperature source Oral, resp. rate 18, height 5' 7 (1.702 m), weight 125.7 kg, last menstrual period 08/10/2023, SpO2 100%. Physical Examination: CONSTITUTIONAL: Well-developed, well-nourished female in no acute distress.  HENT:  Normocephalic, atraumatic, External right and left ear normal. Oropharynx is clear and moist EYES: Conjunctivae and EOM are normal.  NECK: Normal range of motion, supple, no masses. SKIN: Skin is warm and dry. No rash noted. Not diaphoretic. No erythema. No pallor. NEUROLGIC: Alert and oriented to person, place, and time. Normal reflexes, muscle tone coordination. No cranial nerve deficit noted. PSYCHIATRIC: Normal mood and affect. Normal behavior. Normal judgment and thought content. CARDIOVASCULAR: Normal heart rate noted, regular rhythm RESPIRATORY: Effort and breath sounds normal, no problems with respiration noted MUSCULOSKELETAL: Normal range of motion. No edema and no tenderness. ABDOMEN: Soft, nontender, nondistended, gravid. CERVIX: deferred  Fetal monitoring: FHR: 130s bpm, Variability: moderate, Accelerations: Present, Decelerations: Absent  Uterine activity: none   Results for orders placed or performed during the hospital encounter of 03/23/24 (from the past 48 hours)  CBC     Status: Abnormal   Collection Time: 03/31/24  4:19 AM  Result Value Ref Range   WBC 10.3 4.0 - 10.5 K/uL   RBC 3.46 (L) 3.87 - 5.11 MIL/uL    Hemoglobin 10.8 (L) 12.0 - 15.0 g/dL   HCT 67.2 (L) 63.9 - 53.9 %   MCV 94.5 80.0 - 100.0 fL   MCH 31.2 26.0 - 34.0 pg   MCHC 33.0 30.0 - 36.0 g/dL   RDW 86.7 88.4 - 84.4 %   Platelets 113 (L) 150 - 400 K/uL   nRBC 0.0 0.0 - 0.2 %    Comment: Performed at [facility], [address], [place], [place] [address]  Comprehensive metabolic panel     Status: Abnormal   Collection Time: 03/31/24  4:19 AM  Result Value Ref Range   Sodium 131 (L) 135 - 145 mmol/L   Potassium 4.8 3.5 - 5.1 mmol/L   Chloride 102 98 - 111 mmol/L   CO2 20 (L) 22 - 32 mmol/L   Glucose, Bld 71 70 - 99 mg/dL    Comment: Glucose reference range applies only to samples taken after fasting for at least 8 hours.   BUN 13 6 - 20 mg/dL   Creatinine, Ser 9.11 0.44 - 1.00 mg/dL   Calcium  8.6 (L) 8.9 - 10.3 mg/dL   Total Protein 5.8 (L) 6.5 - 8.1 g/dL   Albumin 2.4 (L) 3.5 - 5.0 g/dL   AST 16 15 - 41 U/L   ALT 12 0 - 44 U/L   Alkaline Phosphatase 94 38 - 126 U/L   Total Bilirubin 0.4 0.0 - 1.2 mg/dL   GFR, Estimated >39 >39 mL/min    Comment: (NOTE) Calculated using the CKD-EPI Creatinine Equation (2021)    Anion gap 9 5 - 15    Comment: Performed at [facility], [address], [place], [place] [address]  Type and screen [facility]     Status: None   Collection Time: 03/31/24  8:36 AM  Result Value Ref Range   ABO/RH(D) O POS    Antibody Screen NEG    Sample Expiration      04/03/2024,2359 Performed at [facility], [address], [place], [place] [address]     I have reviewed the patient's current medications.  ASSESSMENT: Principal Problem:   Chronic hypertension affecting pregnancy Active Problems:   CAD (coronary artery disease)   Tobacco abuse   History of 3 cesarean sections   UTI in pregnancy   Gestational thrombocytopenia   Chronic hypertension with superimposed preeclampsia   PLAN: CHTN with superimposed preeclampsia      -BP stable, mild range  elevation      -platelets objectively low at 113  2. FWB     -BPP today 4/8, per MFM repeat BPP in 4 hours, if still abnormal score MFM recommends delivery 2/2 severe preeclampsia and testing NPO for now  3. Previous c/s x 3      -repeat c/s with BTL and progesterone IUD placement at time of delivery if possible.   Continue routine antenatal care.   Jerilynn Buddle, MD [facility] Faculty Attending, Center for [facility] 03/31/2024 10:59 AM

## 2024-03-31 NOTE — Discharge Summary (Signed)
 Postpartum Discharge Summary  Date of Service updated***     Patient Name: [name] DOB: 1981-02-25 MRN: [id number]  Date of admission: 03/23/2024 Delivery date:03/31/2024 Delivering provider: MARILYNN NEST Date of discharge: 03/31/2024  Admitting diagnosis: Chronic hypertension affecting pregnancy [O10.919] Intrauterine pregnancy: [redacted]w[redacted]d     Secondary diagnosis:  Principal Problem:   Chronic hypertension affecting pregnancy Active Problems:   CAD (coronary artery disease)   Tobacco abuse   History of 3 cesarean sections   UTI in pregnancy   Gestational thrombocytopenia   Chronic hypertension with superimposed preeclampsia Desires sterilization  Additional problems:     Discharge diagnosis: {DX.:23714}                                              Post partum procedures:{Postpartum procedures:23558} Augmentation: N/A Complications: {OB Labor/Delivery Complications:20784}  Hospital course:  43 y.o. yo H5E6895 at [redacted]w[redacted]d was admitted to the hospital 03/23/2024 for chronic hypertension with superimposed severe preeclampsia.  She was treated with IV magnesium x 24 hours.  She was continued on Procardia 60 mg daily.  Patient was given betamethasone for fetal lung maturity on 10/19-10/20.  She was followed with serial lab work as well as antepartum testing.  On 10/25, initial BPP was noted to be 4/8 repeat was 6/8.  Initially the NST was a category 1; however, she then noted to have variable D cells.  At that time the plan was made to proceed with delivery.  Of note in the OR following spinal anesthesia, patient noted to have a seizure.  She was given supplemental oxygen and IV magnesium was restarted.  See operative note regarding procedure s  Delivery details are as follows:  Membrane Rupture Time/Date: 5:29 PM,03/31/2024  Delivery Method:C-Section, Low Transverse Operative Delivery:N/A Details of operation can be found in separate operative note.  Patient had a postpartum  course complicated by***.  She is ambulating, tolerating a regular diet, passing flatus, and urinating well. Patient is discharged home in stable condition on  03/31/24        Newborn Data: Birth date:03/31/2024 Birth time:5:30 PM Gender:Female Living status:Living Apgars:8 ,9  Weight:1780 g    Magnesium Sulfate received: Yes: Seizure prophylaxis BMZ received: Yes Rhophylac:N/A MMR:{MMR:30440033} T-DaP:{Tdap:23962} Flu: {Qol:76036} RSV Vaccine received: {RSV:31013} Transfusion:{Transfusion received:30440034}  Immunizations received: Immunization History  Administered Date(s) Administered   Tdap 03/26/2024    Physical exam  Vitals:   03/31/24 1630 03/31/24 1853 03/31/24 1900 03/31/24 1917  BP: (!) 148/78 (!) 131/102 (!) 121/93 (!) 149/87  Pulse: 72  69 70  Resp: 17 19 13  (!) 21  Temp: 98.1 F (36.7 C)   97.6 F (36.4 C)  TempSrc: Oral   Oral  SpO2: 100%  100%   Weight:      Height:       General: {Exam; general:21111117} Lochia: {Desc; appropriate/inappropriate:30686::appropriate} Uterine Fundus: {Desc; firm/soft:30687} Incision: {Exam; incision:21111123} DVT Evaluation: {Exam; dvt:2111122} Labs: Lab Results  Component Value Date   WBC 10.3 03/31/2024   HGB 10.8 (L) 03/31/2024   HCT 32.7 (L) 03/31/2024   MCV 94.5 03/31/2024   PLT 113 (L) 03/31/2024      Latest Ref Rng & Units 03/31/2024    4:19 AM  CMP  Glucose 70 - 99 mg/dL 71   BUN 6 - 20 mg/dL 13   Creatinine 9.55 - 1.00 mg/dL 9.11  Sodium 135 - 145 mmol/L 131   Potassium 3.5 - 5.1 mmol/L 4.8   Chloride 98 - 111 mmol/L 102   CO2 22 - 32 mmol/L 20   Calcium  8.9 - 10.3 mg/dL 8.6   Total Protein 6.5 - 8.1 g/dL 5.8   Total Bilirubin 0.0 - 1.2 mg/dL 0.4   Alkaline Phos 38 - 126 U/L 94   AST 15 - 41 U/L 16   ALT 0 - 44 U/L 12    Edinburgh Score:     No data to display         No data recorded  After visit meds:  Allergies as of 03/31/2024   No Known Allergies   Med Rec must be  completed prior to using this [facility]***        Discharge home in stable condition Infant Feeding: {Baby feeding:23562} Infant Disposition:{CHL IP OB HOME WITH FNUYZM:76418} Discharge instruction: per After Visit Summary and Postpartum booklet. Activity: Advance as tolerated. Pelvic rest for 6 weeks.  Diet: {OB ipzu:78888878} Future Appointments: Future Appointments  Date Time Provider Department Center  04/06/2024 10:30 AM [facility] PROVIDER 1 [facility]  04/06/2024 10:45 AM [facility] NST [facility]  04/13/2024 11:15 AM [facility] PROVIDER 1 [facility]  04/13/2024 11:30 AM [facility] US3 [facility]  04/20/2024  3:15 PM [facility] PROVIDER 1 [facility]  04/20/2024  3:30 PM [facility] US3 [facility]  04/27/2024  3:15 PM [facility] PROVIDER 1 [facility]  04/27/2024  3:30 PM [facility] US1 [facility]   Follow up Visit:   Please schedule this patient for a In person postpartum visit in 1 week with the following provider: Any provider. Additional Postpartum F/U:Incision check 1 week and BP check 1 week  High risk pregnancy complicated by: Chronic hypertension with superimposed preeclampsia, history of NSTEMI, Delivery mode:  C-Section, Low Transverse Anticipated Birth Control:  bilateral salpingectomy  Message sent to office 10/25   03/31/2024 Jennifer M Ozan, DO

## 2024-04-01 LAB — CBC
HCT: 31.2 % — ABNORMAL LOW (ref 36.0–46.0)
Hemoglobin: 10.4 g/dL — ABNORMAL LOW (ref 12.0–15.0)
MCH: 31.4 pg (ref 26.0–34.0)
MCHC: 33.3 g/dL (ref 30.0–36.0)
MCV: 94.3 fL (ref 80.0–100.0)
Platelets: 115 K/uL — ABNORMAL LOW (ref 150–400)
RBC: 3.31 MIL/uL — ABNORMAL LOW (ref 3.87–5.11)
RDW: 12.8 % (ref 11.5–15.5)
WBC: 23.4 K/uL — ABNORMAL HIGH (ref 4.0–10.5)
nRBC: 0 % (ref 0.0–0.2)

## 2024-04-01 MED ORDER — FUROSEMIDE 20 MG PO TABS
20.0000 mg | ORAL_TABLET | Freq: Every day | ORAL | Status: DC
  Administered 2024-04-01 – 2024-04-02 (×2): 20 mg via ORAL
  Filled 2024-04-01 (×2): qty 1

## 2024-04-01 MED ORDER — POTASSIUM CHLORIDE CRYS ER 20 MEQ PO TBCR
20.0000 meq | EXTENDED_RELEASE_TABLET | Freq: Every day | ORAL | Status: DC
  Administered 2024-04-01 – 2024-04-02 (×2): 20 meq via ORAL
  Filled 2024-04-01 (×2): qty 1

## 2024-04-01 NOTE — Progress Notes (Signed)
 Subjective: Postpartum Day 1: Cesarean Delivery Patient reports tolerating PO and no problems voiding.    Objective: Vital signs in last 24 hours: Temp:  [97.6 F (36.4 C)-98.1 F (36.7 C)] 97.6 F (36.4 C) (10/26 0446) Pulse Rate:  [63-83] 83 (10/26 0458) Resp:  [10-21] 16 (10/26 0600) BP: (114-158)/(74-107) 114/86 (10/26 0458) SpO2:  [91 %-100 %] 96 % (10/25 2050) Vitals:   04/01/24 0455 04/01/24 0458 04/01/24 0500 04/01/24 0600  BP: 124/74 114/86    Pulse: 76 83    Resp:   18 16  Temp:      TempSrc:      SpO2:      Weight:      Height:        Intake/Output Summary (Last 24 hours) at 04/01/2024 0726 Last data filed at 04/01/2024 0600 Gross per 24 hour  Intake 2110 ml  Output 2451 ml  Net -341 ml    Physical Exam:  General: alert, cooperative, and appears stated age Lochia: appropriate Uterine Fundus: firm Incision: healing well DVT Evaluation: No evidence of DVT seen on physical exam.  Recent Labs    03/31/24 0419 04/01/24 0433  HGB 10.8* 10.4*  HCT 32.7* 31.2*    Assessment/Plan: Status post Cesarean section. Postoperative course complicated by SIPE with severe features  Continue current care. Hgb stable On Procardia Continue Magnesium x 24 hours post delivery Add lasix and K  Dana Auerbach S Shiori Adcox, MD 04/01/2024, 7:24 AM

## 2024-04-01 NOTE — Plan of Care (Signed)
 Problem: Fluid Volume: Goal: Peripheral tissue perfusion will improve 04/01/2024 1214 by Knute Suzen RAMAN, RN Outcome: Progressing 04/01/2024 1205 by Knute Suzen RAMAN, RN Outcome: Progressing   Problem: Clinical Measurements: Goal: Complications related to disease process, condition or treatment will be avoided or minimized 04/01/2024 1214 by Knute Suzen RAMAN, RN Outcome: Progressing 04/01/2024 1205 by Knute Suzen RAMAN, RN Outcome: Progressing   Problem: Clinical Measurements: Goal: Complications related to the disease process, condition or treatment will be avoided or minimized 04/01/2024 1214 by Knute Suzen RAMAN, RN Outcome: Progressing 04/01/2024 1205 by Knute Suzen RAMAN, RN Outcome: Progressing   Problem: Health Behavior/Discharge Planning: Goal: Ability to manage health-related needs will improve 04/01/2024 1214 by Knute Suzen RAMAN, RN Outcome: Progressing 04/01/2024 1205 by Knute Suzen RAMAN, RN Outcome: Progressing   Problem: Clinical Measurements: Goal: Ability to maintain clinical measurements within normal limits will improve 04/01/2024 1214 by Knute Suzen RAMAN, RN Outcome: Progressing 04/01/2024 1205 by Knute Suzen RAMAN, RN Outcome: Progressing Goal: Will remain free from infection 04/01/2024 1214 by Knute Suzen RAMAN, RN Outcome: Progressing 04/01/2024 1205 by Knute Suzen RAMAN, RN Outcome: Progressing Goal: Diagnostic test results will improve 04/01/2024 1214 by Knute Suzen RAMAN, RN Outcome: Progressing 04/01/2024 1205 by Knute Suzen RAMAN, RN Outcome: Progressing Goal: Respiratory complications will improve 04/01/2024 1214 by Knute Suzen RAMAN, RN Outcome: Progressing 04/01/2024 1205 by Knute Suzen RAMAN, RN Outcome: Progressing Goal: Cardiovascular complication will be avoided 04/01/2024 1214 by Knute Suzen RAMAN, RN Outcome: Progressing 04/01/2024 1205 by Knute Suzen RAMAN, RN Outcome: Progressing   Problem:  Education: Goal: Knowledge of the prescribed therapeutic regimen will improve 04/01/2024 1214 by Knute Suzen RAMAN, RN Outcome: Progressing 04/01/2024 1205 by Knute Suzen RAMAN, RN Outcome: Progressing Goal: Understanding of sexual limitations or changes related to disease process or condition will improve 04/01/2024 1214 by Knute Suzen RAMAN, RN Outcome: Progressing 04/01/2024 1205 by Knute Suzen RAMAN, RN Outcome: Progressing Goal: Individualized Educational Video(s) 04/01/2024 1214 by Knute Suzen RAMAN, RN Outcome: Progressing 04/01/2024 1205 by Knute Suzen RAMAN, RN Outcome: Progressing   Problem: Self-Concept: Goal: Communication of feelings regarding changes in body function or appearance will improve 04/01/2024 1214 by Knute Suzen RAMAN, RN Outcome: Progressing 04/01/2024 1205 by Knute Suzen RAMAN, RN Outcome: Progressing   Problem: Skin Integrity: Goal: Demonstration of wound healing without infection will improve 04/01/2024 1214 by Knute Suzen RAMAN, RN Outcome: Progressing 04/01/2024 1205 by Knute Suzen RAMAN, RN Outcome: Progressing   Problem: Education: Goal: Knowledge of condition will improve 04/01/2024 1214 by Knute Suzen RAMAN, RN Outcome: Progressing 04/01/2024 1205 by Knute Suzen RAMAN, RN Outcome: Progressing Goal: Individualized Educational Video(s) 04/01/2024 1214 by Knute Suzen RAMAN, RN Outcome: Progressing 04/01/2024 1205 by Knute Suzen RAMAN, RN Outcome: Progressing Goal: Individualized Newborn Educational Video(s) 04/01/2024 1214 by Knute Suzen RAMAN, RN Outcome: Progressing 04/01/2024 1205 by Knute Suzen RAMAN, RN Outcome: Progressing   Problem: Activity: Goal: Will verbalize the importance of balancing activity with adequate rest periods 04/01/2024 1214 by Knute Suzen RAMAN, RN Outcome: Progressing 04/01/2024 1205 by Knute Suzen RAMAN, RN Outcome: Progressing Goal: Ability to tolerate increased activity will  improve 04/01/2024 1214 by Knute Suzen RAMAN, RN Outcome: Progressing 04/01/2024 1205 by Knute Suzen RAMAN, RN Outcome: Progressing   Problem: Coping: Goal: Ability to identify and utilize available resources and services will improve 04/01/2024 1214 by Knute Suzen RAMAN, RN Outcome: Progressing 04/01/2024 1205 by Knute Suzen RAMAN, RN Outcome: Progressing   Problem: Life Cycle: Goal: Chance of risk for complications during the postpartum period  will decrease 04/01/2024 1214 by Knute Suzen RAMAN, RN Outcome: Progressing 04/01/2024 1205 by Knute Suzen RAMAN, RN Outcome: Progressing   Problem: Role Relationship: Goal: Ability to demonstrate positive interaction with newborn will improve 04/01/2024 1214 by Knute Suzen RAMAN, RN Outcome: Progressing 04/01/2024 1205 by Knute Suzen RAMAN, RN Outcome: Progressing   Problem: Skin Integrity: Goal: Demonstration of wound healing without infection will improve 04/01/2024 1214 by Knute Suzen RAMAN, RN Outcome: Progressing 04/01/2024 1205 by Knute Suzen RAMAN, RN Outcome: Progressing   Problem: Education: Goal: Knowledge of condition will improve 04/01/2024 1214 by Knute Suzen RAMAN, RN Outcome: Progressing 04/01/2024 1205 by Knute Suzen RAMAN, RN Outcome: Progressing Goal: Individualized Educational Video(s) 04/01/2024 1214 by Knute Suzen RAMAN, RN Outcome: Progressing 04/01/2024 1205 by Knute Suzen RAMAN, RN Outcome: Progressing Goal: Individualized Newborn Educational Video(s) 04/01/2024 1214 by Knute Suzen RAMAN, RN Outcome: Progressing 04/01/2024 1205 by Knute Suzen RAMAN, RN Outcome: Progressing   Problem: Activity: Goal: Will verbalize the importance of balancing activity with adequate rest periods 04/01/2024 1214 by Knute Suzen RAMAN, RN Outcome: Progressing 04/01/2024 1205 by Knute Suzen RAMAN, RN Outcome: Progressing Goal: Ability to tolerate increased activity will improve 04/01/2024 1214 by  Knute Suzen RAMAN, RN Outcome: Progressing 04/01/2024 1205 by Knute Suzen RAMAN, RN Outcome: Progressing   Problem: Coping: Goal: Ability to identify and utilize available resources and services will improve 04/01/2024 1214 by Knute Suzen RAMAN, RN Outcome: Progressing 04/01/2024 1205 by Knute Suzen RAMAN, RN Outcome: Progressing   Problem: Life Cycle: Goal: Chance of risk for complications during the postpartum period will decrease 04/01/2024 1214 by Knute Suzen RAMAN, RN Outcome: Progressing 04/01/2024 1205 by Knute Suzen RAMAN, RN Outcome: Progressing   Problem: Role Relationship: Goal: Ability to demonstrate positive interaction with newborn will improve 04/01/2024 1214 by Knute Suzen RAMAN, RN Outcome: Progressing 04/01/2024 1205 by Knute Suzen RAMAN, RN Outcome: Progressing   Problem: Skin Integrity: Goal: Demonstration of wound healing without infection will improve 04/01/2024 1214 by Knute Suzen RAMAN, RN Outcome: Progressing 04/01/2024 1205 by Knute Suzen RAMAN, RN Outcome: Progressing

## 2024-04-01 NOTE — Lactation Note (Signed)
 This note was copied from a baby's chart.  NICU Lactation Consultation Note  Patient Name: [name] Unijb'd Date: 04/01/2024 Age:43 years  Reason for consult: Initial assessment; NICU baby; Exclusive pumping and bottle feeding; Preterm <34wks; 1st time breastfeeding; Infant < 5lbs; Other (Comment) (Chronic HBP, on MgSO4, AMA)  SUBJECTIVE  LC in to visit with P4 Mom of preterm [name] who was delivered by C/Section and admitted to the NICU.  Baby is currently on scheduled gavage feedings and breathing room air.  Mom has not help baby yet, but plans to after getting off her MgS04.  Mom did not breastfeed her other children, but due to baby being preterm, she plans to pump and provide her EBM to baby.  Mom does not desire to direct breastfeed at this time.  LC set up the pump and assisted her with her first pumping using 18 mm flanges.  Mom educated on the importance of consistent regular pumping to support her breast milk supply.  Mom aware it is normal to not express more than drops for a few days. Mom encouraged to massage and hand express as well as pump.  Plan written on dry erase board- 1- STS with baby during care times 2- massage breasts and hand express 3- Pump both breasts on initiation setting, goal is pumping 8 times per 24 hrs. 4- ask for LC prn  OBJECTIVE Infant data: Mother's Current Feeding Choice: Breast Milk and Donor Milk  O2 Device: Room Air  Infant feeding assessment IDFTS - Readiness: 3   Maternal data: [id number] C-Section, Low Transverse Has patient been taught Hand Expression?: Yes Hand Expression Comments: no colostrum presently Significant Breast History:: ++ breast changes Current breast feeding challenges:: Infant separation Does the patient have breastfeeding experience prior to this delivery?: No Pumping frequency: initiated pumping at 17 hrs post partum Flange Size: 18 Risk factor for low/delayed milk supply:: Delayed breast  stimulation to 17 hrs post partum  WIC Program: No WIC Referral Sent?: No Pump: Referral sent for Stork Pump  ASSESSMENT Infant:  Feeding Status: Scheduled 9-12-3-6 Feeding method: Tube/Gavage (Bolus)  Maternal: Milk volume: Normal  INTERVENTIONS/PLAN Interventions: Interventions: Breast feeding basics reviewed; Skin to skin; Breast massage; Hand express; DEBP; Education; CDC Guidelines for Breast Pump Cleaning; NICU Pumping Log; LC Services brochure Tools: Pump; Flanges Pump Education: Setup, frequency, and cleaning; Milk Storage  Plan: Consult Status: NICU follow-up NICU Follow-up type: New admission follow up; Verify DEBP issuance   [name] [name] 04/01/2024, 11:04 AM

## 2024-04-01 NOTE — Plan of Care (Signed)
 Problem: Fluid Volume: Goal: Peripheral tissue perfusion will improve Outcome: Progressing   Problem: Clinical Measurements: Goal: Complications related to disease process, condition or treatment will be avoided or minimized Outcome: Progressing   Problem: Clinical Measurements: Goal: Complications related to the disease process, condition or treatment will be avoided or minimized Outcome: Progressing   Problem: Health Behavior/Discharge Planning: Goal: Ability to manage health-related needs will improve Outcome: Progressing   Problem: Clinical Measurements: Goal: Ability to maintain clinical measurements within normal limits will improve Outcome: Progressing Goal: Will remain free from infection Outcome: Progressing Goal: Diagnostic test results will improve Outcome: Progressing Goal: Respiratory complications will improve Outcome: Progressing Goal: Cardiovascular complication will be avoided Outcome: Progressing   Problem: Education: Goal: Knowledge of the prescribed therapeutic regimen will improve Outcome: Progressing Goal: Understanding of sexual limitations or changes related to disease process or condition will improve Outcome: Progressing Goal: Individualized Educational Video(s) Outcome: Progressing   Problem: Self-Concept: Goal: Communication of feelings regarding changes in body function or appearance will improve Outcome: Progressing   Problem: Skin Integrity: Goal: Demonstration of wound healing without infection will improve Outcome: Progressing   Problem: Education: Goal: Knowledge of condition will improve Outcome: Progressing Goal: Individualized Educational Video(s) Outcome: Progressing Goal: Individualized Newborn Educational Video(s) Outcome: Progressing   Problem: Activity: Goal: Will verbalize the importance of balancing activity with adequate rest periods Outcome: Progressing Goal: Ability to tolerate increased activity will  improve Outcome: Progressing   Problem: Coping: Goal: Ability to identify and utilize available resources and services will improve Outcome: Progressing   Problem: Life Cycle: Goal: Chance of risk for complications during the postpartum period will decrease Outcome: Progressing   Problem: Role Relationship: Goal: Ability to demonstrate positive interaction with newborn will improve Outcome: Progressing   Problem: Skin Integrity: Goal: Demonstration of wound healing without infection will improve Outcome: Progressing   Problem: Education: Goal: Knowledge of condition will improve Outcome: Progressing Goal: Individualized Educational Video(s) Outcome: Progressing Goal: Individualized Newborn Educational Video(s) Outcome: Progressing   Problem: Activity: Goal: Will verbalize the importance of balancing activity with adequate rest periods Outcome: Progressing Goal: Ability to tolerate increased activity will improve Outcome: Progressing   Problem: Coping: Goal: Ability to identify and utilize available resources and services will improve Outcome: Progressing   Problem: Life Cycle: Goal: Chance of risk for complications during the postpartum period will decrease Outcome: Progressing   Problem: Role Relationship: Goal: Ability to demonstrate positive interaction with newborn will improve Outcome: Progressing   Problem: Skin Integrity: Goal: Demonstration of wound healing without infection will improve Outcome: Progressing   Problem: Fluid Volume: Goal: Peripheral tissue perfusion will improve Outcome: Progressing   Problem: Clinical Measurements: Goal: Complications related to disease process, condition or treatment will be avoided or minimized Outcome: Progressing   Problem: Clinical Measurements: Goal: Complications related to the disease process, condition or treatment will be avoided or minimized Outcome: Progressing   Problem: Health Behavior/Discharge  Planning: Goal: Ability to manage health-related needs will improve Outcome: Progressing   Problem: Clinical Measurements: Goal: Ability to maintain clinical measurements within normal limits will improve Outcome: Progressing Goal: Will remain free from infection Outcome: Progressing Goal: Diagnostic test results will improve Outcome: Progressing Goal: Respiratory complications will improve Outcome: Progressing Goal: Cardiovascular complication will be avoided Outcome: Progressing   Problem: Education: Goal: Knowledge of the prescribed therapeutic regimen will improve Outcome: Progressing Goal: Understanding of sexual limitations or changes related to disease process or condition will improve Outcome: Progressing Goal: Individualized Educational Video(s) Outcome: Progressing  Problem: Self-Concept: Goal: Communication of feelings regarding changes in body function or appearance will improve Outcome: Progressing   Problem: Skin Integrity: Goal: Demonstration of wound healing without infection will improve Outcome: Progressing   Problem: Education: Goal: Knowledge of condition will improve Outcome: Progressing Goal: Individualized Educational Video(s) Outcome: Progressing Goal: Individualized Newborn Educational Video(s) Outcome: Progressing   Problem: Activity: Goal: Will verbalize the importance of balancing activity with adequate rest periods Outcome: Progressing Goal: Ability to tolerate increased activity will improve Outcome: Progressing   Problem: Coping: Goal: Ability to identify and utilize available resources and services will improve Outcome: Progressing   Problem: Life Cycle: Goal: Chance of risk for complications during the postpartum period will decrease Outcome: Progressing   Problem: Role Relationship: Goal: Ability to demonstrate positive interaction with newborn will improve Outcome: Progressing   Problem: Skin Integrity: Goal: Demonstration  of wound healing without infection will improve Outcome: Progressing   Problem: Education: Goal: Knowledge of condition will improve Outcome: Progressing Goal: Individualized Educational Video(s) Outcome: Progressing Goal: Individualized Newborn Educational Video(s) Outcome: Progressing   Problem: Activity: Goal: Will verbalize the importance of balancing activity with adequate rest periods Outcome: Progressing Goal: Ability to tolerate increased activity will improve Outcome: Progressing   Problem: Coping: Goal: Ability to identify and utilize available resources and services will improve Outcome: Progressing   Problem: Life Cycle: Goal: Chance of risk for complications during the postpartum period will decrease Outcome: Progressing   Problem: Role Relationship: Goal: Ability to demonstrate positive interaction with newborn will improve Outcome: Progressing   Problem: Skin Integrity: Goal: Demonstration of wound healing without infection will improve Outcome: Progressing

## 2024-04-02 ENCOUNTER — Encounter (HOSPITAL_COMMUNITY): Payer: Self-pay | Admitting: Obstetrics & Gynecology

## 2024-04-02 ENCOUNTER — Other Ambulatory Visit (HOSPITAL_COMMUNITY): Payer: Self-pay

## 2024-04-02 ENCOUNTER — Other Ambulatory Visit: Payer: Self-pay | Admitting: Obstetrics and Gynecology

## 2024-04-02 DIAGNOSIS — I214 Non-ST elevation (NSTEMI) myocardial infarction: Secondary | ICD-10-CM

## 2024-04-02 MED ORDER — OXYCODONE HCL 5 MG PO TABS: 5.0000 mg | ORAL_TABLET | ORAL | 0 refills | Status: DC | PRN

## 2024-04-02 MED ORDER — IBUPROFEN 600 MG PO TABS: 600.0000 mg | ORAL_TABLET | Freq: Four times a day (QID) | ORAL | 0 refills | Status: DC

## 2024-04-02 MED ORDER — OXYCODONE HCL 5 MG PO TABS
5.0000 mg | ORAL_TABLET | ORAL | 0 refills | Status: DC | PRN
  Filled 2024-04-02: qty 20, 2d supply, fill #0

## 2024-04-02 MED ORDER — GABAPENTIN 300 MG PO CAPS
300.0000 mg | ORAL_CAPSULE | Freq: Two times a day (BID) | ORAL | 0 refills | Status: DC
  Filled 2024-04-02 – 2024-04-04 (×2): qty 10, 5d supply, fill #0

## 2024-04-02 MED ORDER — GABAPENTIN 300 MG PO CAPS: 300.0000 mg | ORAL_CAPSULE | Freq: Two times a day (BID) | ORAL | 0 refills | Status: DC

## 2024-04-02 MED ORDER — FERROUS SULFATE 325 (65 FE) MG PO TABS
325.0000 mg | ORAL_TABLET | ORAL | 3 refills | Status: DC
  Filled 2024-04-02: qty 45, 90d supply, fill #0

## 2024-04-02 MED ORDER — NIFEDIPINE ER 60 MG PO TB24: 60.0000 mg | ORAL_TABLET | Freq: Two times a day (BID) | ORAL | 1 refills | Status: DC

## 2024-04-02 MED ORDER — NIFEDIPINE ER 60 MG PO TB24
60.0000 mg | ORAL_TABLET | Freq: Two times a day (BID) | ORAL | 1 refills | Status: DC
  Filled 2024-04-02 – 2024-04-04 (×2): qty 60, 30d supply, fill #0

## 2024-04-02 MED ORDER — FUROSEMIDE 20 MG PO TABS
20.0000 mg | ORAL_TABLET | Freq: Every day | ORAL | 0 refills | Status: DC
  Filled 2024-04-02 – 2024-04-04 (×2): qty 5, 5d supply, fill #0

## 2024-04-02 MED ORDER — FUROSEMIDE 20 MG PO TABS: 20.0000 mg | ORAL_TABLET | Freq: Every day | ORAL | 0 refills | Status: DC

## 2024-04-02 MED ORDER — FERROUS SULFATE 325 (65 FE) MG PO TABS: 325.0000 mg | ORAL_TABLET | ORAL | 3 refills | Status: DC

## 2024-04-02 MED ORDER — POTASSIUM CHLORIDE CRYS ER 20 MEQ PO TBCR
20.0000 meq | EXTENDED_RELEASE_TABLET | Freq: Every day | ORAL | 0 refills | Status: DC
  Filled 2024-04-02 – 2024-04-04 (×2): qty 5, 5d supply, fill #0

## 2024-04-02 MED ORDER — POTASSIUM CHLORIDE CRYS ER 20 MEQ PO TBCR: 20.0000 meq | EXTENDED_RELEASE_TABLET | Freq: Every day | ORAL | 0 refills | Status: DC

## 2024-04-02 MED ORDER — NIFEDIPINE ER OSMOTIC RELEASE 60 MG PO TB24
60.0000 mg | ORAL_TABLET | Freq: Two times a day (BID) | ORAL | Status: DC
  Administered 2024-04-02: 60 mg via ORAL
  Filled 2024-04-02: qty 1

## 2024-04-02 MED ORDER — IBUPROFEN 600 MG PO TABS
600.0000 mg | ORAL_TABLET | Freq: Four times a day (QID) | ORAL | 0 refills | Status: AC
  Filled 2024-04-02 – 2024-04-04 (×2): qty 30, 8d supply, fill #0

## 2024-04-02 NOTE — Plan of Care (Signed)
  Problem: Fluid Volume: Goal: Peripheral tissue perfusion will improve Outcome: Progressing   Problem: Clinical Measurements: Goal: Complications related to disease process, condition or treatment will be avoided or minimized Outcome: Progressing   Problem: Clinical Measurements: Goal: Complications related to the disease process, condition or treatment will be avoided or minimized Outcome: Progressing   Problem: Clinical Measurements: Goal: Complications related to the disease process, condition or treatment will be avoided or minimized Outcome: Progressing   Problem: Health Behavior/Discharge Planning: Goal: Ability to manage health-related needs will improve Outcome: Progressing   Problem: Clinical Measurements: Goal: Ability to maintain clinical measurements within normal limits will improve Outcome: Progressing Goal: Will remain free from infection Outcome: Progressing

## 2024-04-02 NOTE — Social Work (Signed)
 Patient screened out for psychosocial assessment since none of the following apply: Psychosocial stressors documented in mother or baby's chart Gestation less than 32 weeks Code at delivery  Infant with anomalies Please contact the Clinical Social Worker if specific needs arise, by MOB's request, or if MOB scores greater than 9/yes to question 10 on [place] Postpartum Depression Screen.   Nat Quiet, MSW, LCSW Clinical Social Worker  [phone] 04/02/2024  10:17 AM

## 2024-04-02 NOTE — Lactation Note (Signed)
 This note was copied from a baby's chart.  NICU Lactation Consultation Note  Patient Name: [name] Date: 04/02/2024 Age:42 hours  Reason for consult: Follow-up assessment; NICU baby; Exclusive pumping and bottle feeding; Preterm <34wks; Other (Comment); 1st time breastfeeding; Mother's request; RN request; Maternal discharge (AMA, cHTN)  SUBJECTIVE Visited with family of 36 22/22 weeks old AGA NICU female [name]; [name] is a P4 but this is her first time breastfeeding. [facility] RN Veva called out for lactation because [name] had some questions. She reported she's been pumping but nothing comes out. [name] is getting discharged from [facility] today. Reviewed discharge education, pump settings and the importance of consistent pumping for the onset of secretory activation and the prevention of engorgement. Medela Stork pump was delivered to baby's room since family had to leave the hospital and couldn't stay any longer. This LC took all pump pieces to baby's room, washed them and set up DEBP and washing/drying stations for the next time [name] comes to visit.   OBJECTIVE Infant data: Mother's Current Feeding Choice: Breast Milk and Donor Milk  O2 Device: Room Air  Infant feeding assessment IDFTS - Readiness: 3   Maternal data: [id number] C-Section, Low Transverse Has patient been taught Hand Expression?: Yes Hand Expression Comments: no colostrum presently Significant Breast History:: ++ breast changes Current breast feeding challenges:: Infant separation Does the patient have breastfeeding experience prior to this delivery?: No Pumping frequency: 4 times/24 hours Pumped volume: 0 mL Flange Size: 18 Risk factor for low/delayed milk supply:: Delayed breast stimulation to 17 hrs post partum  WIC Program: No WIC Referral Sent?: No Pump: Received Stork Pump (Medela MaxFlow)  ASSESSMENT Infant: Feeding Status: Scheduled 9-12-3-6 Feeding method:  Tube/Gavage (Bolus)  Maternal: Milk volume: Normal  INTERVENTIONS/PLAN Interventions: Interventions: Breast feeding basics reviewed; DEBP; Education; Coconut oil Discharge Education: Engorgement and breast care Tools: 86F feeding tube / Syringe Pump Education: Setup, frequency, and cleaning; Milk Storage  Plan: STS around care times Pump both breasts on initiate mode every 3 hours for 15 minutes; ideally 8 pumping sessions/24 hours Switch to maintain mode once expressing + 20 ml EBM combined or by day 5; whichever happens first  [name] present and supportive. All questions and concerns answered, family to contact [facility] services PRN.  Consult Status: NICU follow-up NICU Follow-up type: Verify onset of copious milk; Verify absence of engorgement   Chris Narasimhan S Jenavive Lamboy 04/02/2024, 3:42 PM

## 2024-04-03 ENCOUNTER — Encounter: Admitting: Family Medicine

## 2024-04-04 ENCOUNTER — Other Ambulatory Visit (HOSPITAL_COMMUNITY): Payer: Self-pay

## 2024-04-04 DIAGNOSIS — Z0289 Encounter for other administrative examinations: Secondary | ICD-10-CM

## 2024-04-06 ENCOUNTER — Other Ambulatory Visit

## 2024-04-06 ENCOUNTER — Ambulatory Visit

## 2024-04-06 LAB — SURGICAL PATHOLOGY

## 2024-04-10 ENCOUNTER — Other Ambulatory Visit: Payer: Self-pay

## 2024-04-10 ENCOUNTER — Ambulatory Visit (INDEPENDENT_AMBULATORY_CARE_PROVIDER_SITE_OTHER)

## 2024-04-10 VITALS — BP 125/76 | HR 89 | Wt 265.0 lb

## 2024-04-10 DIAGNOSIS — Z4889 Encounter for other specified surgical aftercare: Secondary | ICD-10-CM

## 2024-04-10 DIAGNOSIS — Z0130 Encounter for examination of blood pressure without abnormal findings: Secondary | ICD-10-CM

## 2024-04-10 NOTE — Progress Notes (Signed)
 Incision Check Visit  [name] is here for incision check and blood pressure check following repeat c-section on 03/31/2024.   Incision assessment: Honeycomb dressing removed at home by pt on 10/30. Incision appears clean, dry and intact. Pt noted to have extensive bruising on abdomen. Pt stated she has had this since delivery and it only causes tenderness, no severe pain.   BP today is 125/76. Patient denies any dizzness blurred vision headache shortness of breath peripheral edema.  Education: Reviewed good wound care and s/s of infection with patient. Reviewed s/s of hypertension with pt and encouraged her to continue monitoring her BP at home as well as continue her medication until her post partum visit.   Patient will follow up post partum visit  on 05/08/2024. Pt denies any further questions or concerns at this time.    Cyndee JAYSON Molt, RN 04/10/2024  1:56 PM

## 2024-04-12 ENCOUNTER — Encounter: Payer: Self-pay | Admitting: Obstetrics & Gynecology

## 2024-04-13 ENCOUNTER — Ambulatory Visit

## 2024-04-15 NOTE — Lactation Note (Signed)
 This note was copied from a baby's chart.  NICU Lactation Consultation Note  Patient Name: [name] Unijb'd Date: 04/15/2024 Age:42 wk.o.  Reason for consult: Follow-up assessment; Exclusive pumping and bottle feeding; NICU baby; Late-preterm 34-36.6wks; 1st time breastfeeding; Other (Comment) (AMA, cHTN)  SUBJECTIVE Visited with family of 84 19/39 weeks old AGA NICU female; [name] reported she's still pumping but not consistently, she's unsure if her breast are back to normal yet, some days they feel soft but some days they feel like she still has something on it (see maternal assessment).   Parents are taking [name] home today. Reviewed discharge education and let her know that if she's not getting anything and her breast are back to normal, her supply might be gone, baby has been only on donor milk/Similac 24 calorie formula during NICU stay. She voiced she would still like to try, advised to follow the 3 hour pumping schedule as close as she can, but if things don't work out is OK to stop. FOB present and supportive. All questions and concerns answered, family to contact [facility] services PRN.  OBJECTIVE Infant data: Mother's Current Feeding Choice: Formula  O2 Device: Room Air  Infant feeding assessment IDFTS - Readiness: 4 IDFTS - Quality: 2   Maternal data: [id number] C-Section, Low Transverse Pumping frequency: 1 time/24 hours Pumped volume: 0 mL  WIC Program: No WIC Referral Sent?: No Pump: Received Stork Pump (Medela MaxFlow)  ASSESSMENT Infant: Feeding Status: Ad lib Feeding method: Bottle Nipple Type: Nfant Slow Flow (purple) (in DB)  Maternal: Milk volume: Low No S/S of engorgement at this time; breast seem to be in involution state  INTERVENTIONS/PLAN Interventions: Interventions: Education Discharge Education: Outpatient recommendation  Plan: Consult Status: Complete   [name] 04/15/2024, 1:36 PM

## 2024-04-19 DIAGNOSIS — Z0289 Encounter for other administrative examinations: Secondary | ICD-10-CM

## 2024-04-20 ENCOUNTER — Ambulatory Visit: Admitting: Cardiology

## 2024-04-20 ENCOUNTER — Encounter: Payer: Self-pay | Admitting: Cardiology

## 2024-04-20 ENCOUNTER — Ambulatory Visit

## 2024-04-20 VITALS — BP 126/82 | HR 85 | Ht 67.0 in | Wt 258.1 lb

## 2024-04-20 DIAGNOSIS — I10 Essential (primary) hypertension: Secondary | ICD-10-CM

## 2024-04-20 DIAGNOSIS — I2510 Atherosclerotic heart disease of native coronary artery without angina pectoris: Secondary | ICD-10-CM

## 2024-04-20 NOTE — Progress Notes (Signed)
 Cardio-Obstetrics Clinic  New Evaluation  Date:  04/25/2024   ID:  [name], DOB 1980-08-17, MRN [id number]  PCP:  Default, Provider, MD   [facility] Providers Cardiologist:  Redell Shallow, MD  Electrophysiologist:  None       Referring MD: Izell Harari, MD   Chief Complaint:  I am doing well  History of Present Illness:    [name] is a 43 y.o. female [[id number]] who is being seen today for the evaluation due to known  in the postpartum period at the request of Izell Harari, MD.   Medical hx includes severe preclampsia, mild non-obstructive CAD, hypertension, tobacco use here for a follow visit.   She offers to specific complaints at this time.   Prior CV Studies Reviewed: The following studies were reviewed today:   Past Medical History:  Diagnosis Date   Anemia    Dyslipidemia 01/11/2022   Enlarged lymph node 01/11/2022   Low back pain    Trichomonas infection 09/25/2020   09/23/20 +, treatment sent to pharmacy     Vitamin D deficiency     Past Surgical History:  Procedure Laterality Date   CESAREAN SECTION  2003, 2004, 2009   CESAREAN SECTION N/A 03/31/2024   Procedure: CESAREAN DELIVERY;  Surgeon: Herchel Gloris LABOR, MD;  Location: [facility] LD ORS;  Service: Obstetrics;  Laterality: N/A;   LEFT HEART CATH AND CORONARY ANGIOGRAPHY N/A 01/11/2022   Procedure: LEFT HEART CATH AND CORONARY ANGIOGRAPHY;  Surgeon: Jordan, Peter M, MD;  Location: [facility] INVASIVE CV LAB;  Service: Cardiovascular;  Laterality: N/A;      OB History     Gravida  4   Para  4   Term  3   Preterm  1   AB      Living  4      SAB      IAB      Ectopic      Multiple  0   Live Births  1               Current Medications: Current Meds  Medication Sig   ferrous sulfate 325 (65 FE) MG tablet Take 325 mg by mouth daily with breakfast.   ibuprofen  (ADVIL ) 600 MG tablet Take 1 tablet (600 mg total) by mouth every 6 (six) hours.   NIFEdipine  (ADALAT CC) 60 MG 24 hr tablet Take 1 tablet (60 mg total) by mouth 2 (two) times daily.   nitroGLYCERIN  (NITROSTAT ) 0.4 MG SL tablet Place 1 tablet (0.4 mg total) under the tongue every 5 (five) minutes as needed for chest pain.   VITAMIN D PO Take by mouth.     Allergies:   Patient has no known allergies.   Social History   Socioeconomic History   Marital status: Single    Spouse name: Not on file   Number of children: Not on file   Years of education: Not on file   Highest education level: Not on file  Occupational History   Not on file  Tobacco Use   Smoking status: Former    Types: Cigarettes   Smokeless tobacco: Never  Vaping Use   Vaping status: Never Used  Substance and Sexual Activity   Alcohol use: No   Drug use: No   Sexual activity: Yes  Other Topics Concern   Not on file  Social History Narrative   Not on file   Social Drivers of Health   Financial Resource Strain:  Not on file  Food Insecurity: No Food Insecurity (03/26/2024)   Hunger Vital Sign    Worried About Running Out of Food in the Last Year: Never true    Ran Out of Food in the Last Year: Never true  Transportation Needs: No Transportation Needs (03/23/2024)   PRAPARE - Administrator, Civil Service (Medical): No    Lack of Transportation (Non-Medical): No  Physical Activity: Not on file  Stress: Not on file  Social Connections: Not on file      Family History  Problem Relation Age of Onset   Kidney disease Father    Diabetes Maternal Grandmother    Cancer Maternal Grandmother    Diabetes Maternal Grandfather       ROS:   Please see the history of present illness.     All other systems reviewed and are negative.   Labs/EKG Reviewed:    EKG:  None today    Recent Labs: 03/31/2024: ALT 12; BUN 13; Creatinine, Ser 0.88; Potassium 4.8; Sodium 131 04/01/2024: Hemoglobin 10.4; Platelets 115   Recent Lipid Panel Lab Results  Component Value Date/Time   CHOL 142  01/09/2022 03:41 AM   TRIG 16 01/09/2022 03:41 AM   HDL 54 01/09/2022 03:41 AM   CHOLHDL 2.6 01/09/2022 03:41 AM   LDLCALC 85 01/09/2022 03:41 AM    Physical Exam:    VS:  BP 126/82 (BP Location: Left Arm, Patient Position: Sitting, Cuff Size: Large)   Pulse 85   Ht 5' 7 (1.702 m)   Wt 258 lb 1.6 oz (117.1 kg)   LMP 08/10/2023   SpO2 97%   BMI 40.42 kg/m     Wt Readings from Last 3 Encounters:  04/20/24 258 lb 1.6 oz (117.1 kg)  04/10/24 265 lb (120.2 kg)  03/23/24 277 lb 1.6 oz (125.7 kg)     GEN:  Well nourished, well developed in no acute distress HEENT: Normal NECK: No JVD; No carotid bruits LYMPHATICS: No lymphadenopathy CARDIAC: RRR, no murmurs, rubs, gallops RESPIRATORY:  Clear to auscultation without rales, wheezing or rhonchi  ABDOMEN: Soft, non-tender, non-distended MUSCULOSKELETAL:  No edema; No deformity  SKIN: Warm and dry NEUROLOGIC:  Alert and oriented x 3 PSYCHIATRIC:  Normal affect    Risk Assessment/Risk Calculators:                 ASSESSMENT & PLAN:     She doing well from a CV standpoint.  Plan to shift from nifedipine to another antihypertensive at her next visit.   Mild CAD - no anginal symptoms.   Will need repeat lipids after 3 months postpartum.  Patient Instructions  Medication Instructions:  Your physician recommends that you continue on your current medications as directed. Please refer to the Current Medication list given to you today.  *If you need a refill on your cardiac medications before your next appointment, please call your pharmacy* Follow-Up: At [facility], you and your health needs are our priority.  As part of our continuing mission to provide you with exceptional heart care, our providers are all part of one team.  This team includes your primary Cardiologist (physician) and Advanced Practice Providers or APPs (Physician Assistants and Nurse Practitioners) who all work together to provide you with the  care you need, when you need it.  Your next appointment:   6 week(s)  Provider:   Mischa Brittingham, DO     Dispo:  No follow-ups on file.   Medication  Adjustments/Labs and Tests Ordered: Current medicines are reviewed at length with the patient today.  Concerns regarding medicines are outlined above.  Tests Ordered: No orders of the defined types were placed in this encounter.  Medication Changes: No orders of the defined types were placed in this encounter.

## 2024-04-20 NOTE — Patient Instructions (Signed)
 Medication Instructions:  Your physician recommends that you continue on your current medications as directed. Please refer to the Current Medication list given to you today.  *If you need a refill on your cardiac medications before your next appointment, please call your pharmacy*   Follow-Up: At [facility], you and your health needs are our priority.  As part of our continuing mission to provide you with exceptional heart care, our providers are all part of one team.  This team includes your primary Cardiologist (physician) and Advanced Practice Providers or APPs (Physician Assistants and Nurse Practitioners) who all work together to provide you with the care you need, when you need it.  Your next appointment:   6 week(s)  Provider:   Kardie Tobb, DO

## 2024-04-27 ENCOUNTER — Ambulatory Visit

## 2024-04-30 ENCOUNTER — Telehealth: Payer: Self-pay | Admitting: Family Medicine

## 2024-04-30 NOTE — Telephone Encounter (Signed)
 Patient is calling to speak to someone in regards to her FMLA paperwork. She says that her job has not received her paperwork yet and they are needing it immediately so she is just wanting to speak to someone. I let the pt know that I will send a message to clinical staff and someone will reach back out to her.

## 2024-05-08 ENCOUNTER — Ambulatory Visit: Admitting: Obstetrics & Gynecology

## 2024-05-08 ENCOUNTER — Other Ambulatory Visit: Payer: Self-pay

## 2024-05-08 NOTE — Progress Notes (Signed)
 Post Partum Visit Note  [name] is a 43 y.o. [phone] female who presents for a postpartum visit. She is 5 weeks and 3 days postpartum following a repeat cesarean section.  I have fully reviewed the prenatal and intrapartum course. The delivery was at 33.3 gestational weeks.  Anesthesia: spinal. Postpartum course has been good. Baby is doing well. Baby is feeding by bottle - Similac Neosure. Bleeding no bleeding. Bowel function is normal. Bladder function is normal. Patient is not sexually active. Contraception method is none. Postpartum depression screening: negative.   The pregnancy intention screening data noted above was reviewed. Potential methods of contraception were discussed. The patient elected to proceed with No data recorded.   Edinburgh Postnatal Depression Scale - 05/08/24 1418       Edinburgh Postnatal Depression Scale:  In the Past 7 Days   I have been able to laugh and see the funny side of things. 0    I have looked forward with enjoyment to things. 0    I have blamed myself unnecessarily when things went wrong. 0    I have been anxious or worried for no good reason. 0    I have felt scared or panicky for no good reason. 0    Things have been getting on top of me. 0    I have been so unhappy that I have had difficulty sleeping. 0    I have felt sad or miserable. 0    I have been so unhappy that I have been crying. 0    The thought of harming myself has occurred to me. 0    Edinburgh Postnatal Depression Scale Total 0          Health Maintenance Due  Topic Date Due   Hepatitis C Screening  Never done   Pneumococcal Vaccine (1 of 2 - PCV) Never done   Hepatitis B Vaccines 19-59 Average Risk (1 of 3 - 19+ 3-dose series) Never done   HPV VACCINES (1 - 3-dose SCDM series) Never done   Cervical Cancer Screening (HPV/Pap Cotest)  Never done   Mammogram  Never done   Influenza Vaccine  Never done   COVID-19 Vaccine (1 - 2025-26 season) Never done    The  following portions of the patient's history were reviewed and updated as appropriate: allergies, current medications, past family history, past medical history, past social history, past surgical history, and problem list.  Review of Systems Pertinent items are noted in HPI.  Objective:  Wt 259 lb (117.5 kg)   LMP 08/10/2023   BMI 40.57 kg/m    General:  alert, cooperative, and no distress   Breasts:  not indicated  Lungs:   Heart:  regular rate and rhythm  Abdomen: soft, non-tender; bowel sounds normal; no masses,  no organomegaly   Wound well approximated incision  GU exam:  not indicated       Assessment:    There are no diagnoses linked to this encounter.  normal postpartum exam.  BP well controlled Plan:   Essential components of care per ACOG recommendations:  1.  Mood and well being: Patient with negative depression screening today. Reviewed local resources for support.  - Patient tobacco use? No.   - hx of drug use? No.    2. Infant care and feeding:  -Patient currently breastmilk feeding? No.  -Social determinants of health (SDOH) reviewed in EPIC. No concerns  3. Sexuality, contraception and birth spacing - Patient does  not want a pregnancy in the next year.  Desired family size is 4 children.  - Reviewed reproductive life planning. Reviewed contraceptive methods based on pt preferences and effectiveness.  Patient desired Female Sterilization today.   - Discussed birth spacing of 18 months  4. Sleep and fatigue -Encouraged family/partner/community support of 4 hrs of uninterrupted sleep to help with mood and fatigue  5. Physical Recovery  - Discussed patients delivery and complications. She describes her labor as mixed. - Patient had a C-section repeat; no problems after deliver. - Patient has urinary incontinence? No. - Patient is safe to resume physical and sexual activity  6.  Health Maintenance - HM due items addressed Yes - Last pap smear No results  found for: DIAGPAP Pap smear not done at today's visit. Last pap at [facility] 12/2023 normal -Breast Cancer screening indicated? Yes. Patient referred today for mammogram.   7. Chronic Disease/Pregnancy Condition follow up: Hypertension  - cards follow up, Dr. Sheena Solomons, Lynwood MATSU, MD  Center for [facility], [facility]

## 2024-05-21 ENCOUNTER — Telehealth: Payer: Self-pay | Admitting: Family Medicine

## 2024-05-21 ENCOUNTER — Encounter: Payer: Self-pay | Admitting: Obstetrics & Gynecology

## 2024-05-21 NOTE — Telephone Encounter (Signed)
 Called patient to confirm if she had paid for her short term disability paperwork. Patient says yes she already paid and we have taken care of everything and she was able to get approved.

## 2024-05-28 ENCOUNTER — Ambulatory Visit: Attending: Cardiology | Admitting: Cardiology

## 2024-05-28 ENCOUNTER — Other Ambulatory Visit (HOSPITAL_COMMUNITY): Payer: Self-pay

## 2024-05-28 VITALS — BP 120/82 | HR 72 | Ht 67.0 in

## 2024-05-28 DIAGNOSIS — I517 Cardiomegaly: Secondary | ICD-10-CM

## 2024-05-28 DIAGNOSIS — I10 Essential (primary) hypertension: Secondary | ICD-10-CM

## 2024-05-28 MED ORDER — OLMESARTAN MEDOXOMIL 20 MG PO TABS
20.0000 mg | ORAL_TABLET | Freq: Every day | ORAL | 3 refills | Status: AC
  Filled 2024-05-28: qty 30, 30d supply, fill #0

## 2024-05-28 NOTE — Progress Notes (Unsigned)
 " Cardiology Office Note:    Date:  05/29/2024   ID:  [name], DOB 06/12/1980, MRN [id number]  PCP:  Default, Provider, MD  Cardiologist:  Redell Shallow, MD  Electrophysiologist:  None   Referring MD: No ref. provider found    I am doing well   History of Present Illness:    [name] is a 43 y.o. female with a hx of Dyslipidemia, Vitamin D deficiency. She offers no complaints.  She is overall doing well.   Past Medical History:  Diagnosis Date   Anemia    Dyslipidemia 01/11/2022   Enlarged lymph node 01/11/2022   Low back pain    Trichomonas infection 09/25/2020   09/23/20 +, treatment sent to pharmacy     Vitamin D deficiency     Past Surgical History:  Procedure Laterality Date   CESAREAN SECTION  2003, 2004, 2009   CESAREAN SECTION N/A 03/31/2024   Procedure: CESAREAN DELIVERY;  Surgeon: Herchel Gloris LABOR, MD;  Location: [facility] LD ORS;  Service: Obstetrics;  Laterality: N/A;   LEFT HEART CATH AND CORONARY ANGIOGRAPHY N/A 01/11/2022   Procedure: LEFT HEART CATH AND CORONARY ANGIOGRAPHY;  Surgeon: Jordan, Peter M, MD;  Location: [facility] INVASIVE CV LAB;  Service: Cardiovascular;  Laterality: N/A;    Current Medications: Active Medications[1]   Allergies:   Patient has no known allergies.   Social History   Socioeconomic History   Marital status: Single    Spouse name: Not on file   Number of children: Not on file   Years of education: Not on file   Highest education level: Not on file  Occupational History   Not on file  Tobacco Use   Smoking status: Former    Types: Cigarettes   Smokeless tobacco: Never  Vaping Use   Vaping status: Never Used  Substance and Sexual Activity   Alcohol use: No   Drug use: No   Sexual activity: Yes  Other Topics Concern   Not on file  Social History Narrative   Not on file   Social Drivers of Health   Tobacco Use: Medium Risk (04/20/2024)   Patient History    Smoking Tobacco Use: Former    Smokeless  Tobacco Use: Never    Passive Exposure: Not on Actuary Strain: Not on file  Food Insecurity: No Food Insecurity (03/26/2024)   Epic    Worried About Programme Researcher, Broadcasting/film/video in the Last Year: Never true    Ran Out of Food in the Last Year: Never true  Transportation Needs: No Transportation Needs (03/23/2024)   Epic    Lack of Transportation (Medical): No    Lack of Transportation (Non-Medical): No  Physical Activity: Not on file  Stress: Not on file  Social Connections: Not on file  Depression (EYV7-0): Not on file  Alcohol Screen: Not on file  Housing: Low Risk (03/26/2024)   Epic    Unable to Pay for Housing in the Last Year: No    Number of Times Moved in the Last Year: 0    Homeless in the Last Year: No  Utilities: Not At Risk (03/26/2024)   Epic    Threatened with loss of utilities: No  Health Literacy: Not on file     Family History: The patient's family history includes Cancer in her maternal grandmother; Diabetes in her maternal grandfather and maternal grandmother; Kidney disease in her father.  ROS:   Review of Systems  Constitution: Negative for  decreased appetite, fever and weight gain.  HENT: Negative for congestion, ear discharge, hoarse voice and sore throat.   Eyes: Negative for discharge, redness, vision loss in right eye and visual halos.  Cardiovascular: Negative for chest pain, dyspnea on exertion, leg swelling, orthopnea and palpitations.  Respiratory: Negative for cough, hemoptysis, shortness of breath and snoring.   Endocrine: Negative for heat intolerance and polyphagia.  Hematologic/Lymphatic: Negative for bleeding problem. Does not bruise/bleed easily.  Skin: Negative for flushing, nail changes, rash and suspicious lesions.  Musculoskeletal: Negative for arthritis, joint pain, muscle cramps, myalgias, neck pain and stiffness.  Gastrointestinal: Negative for abdominal pain, bowel incontinence, diarrhea and excessive appetite.   Genitourinary: Negative for decreased libido, genital sores and incomplete emptying.  Neurological: Negative for brief paralysis, focal weakness, headaches and loss of balance.  Psychiatric/Behavioral: Negative for altered mental status, depression and suicidal ideas.  Allergic/Immunologic: Negative for HIV exposure and persistent infections.    EKGs/Labs/Other Studies Reviewed:    The following studies were reviewed today:   EKG: None today  Recent Labs: 03/31/2024: ALT 12; BUN 13; Creatinine, Ser 0.88; Potassium 4.8; Sodium 131 04/01/2024: Hemoglobin 10.4; Platelets 115  Recent Lipid Panel    Component Value Date/Time   CHOL 142 01/09/2022 0341   TRIG 16 01/09/2022 0341   HDL 54 01/09/2022 0341   CHOLHDL 2.6 01/09/2022 0341   VLDL 3 01/09/2022 0341   LDLCALC 85 01/09/2022 0341    Physical Exam:    VS:  BP 120/82 (BP Location: Left Arm, Patient Position: Sitting, Cuff Size: Large)   Pulse 72   Ht 5' 7 (1.702 m)   LMP 08/10/2023   SpO2 95%   BMI 40.57 kg/m     Wt Readings from Last 3 Encounters:  05/08/24 259 lb (117.5 kg)  04/20/24 258 lb 1.6 oz (117.1 kg)  04/10/24 265 lb (120.2 kg)     GEN: Well nourished, well developed in no acute distress HEENT: Normal NECK: No JVD; No carotid bruits LYMPHATICS: No lymphadenopathy CARDIAC: S1S2 noted,RRR, no murmurs, rubs, gallops RESPIRATORY:  Clear to auscultation without rales, wheezing or rhonchi  ABDOMEN: Soft, non-tender, non-distended, +bowel sounds, no guarding. EXTREMITIES: No edema, No cyanosis, no clubbing MUSCULOSKELETAL:  No deformity  SKIN: Warm and dry NEUROLOGIC:  Alert and oriented x 3, non-focal PSYCHIATRIC:  Normal affect, good insight  ASSESSMENT:    1. Chronic hypertension   2. Left ventricular hypertrophy    PLAN:     Blood pressure at target. She does not plan to have other children. We will transition her off Nifedipine , Start Olmesartan  20 mg daily, take blood pressure send update via  mychart in 2 weeks. If elevated will add amlodipine  5 mg daily.    The patient is in agreement with the above plan. The patient left the office in stable condition.  The patient will follow up in   Medication Adjustments/Labs and Tests Ordered: Current medicines are reviewed at length with the patient today.  Concerns regarding medicines are outlined above.  No orders of the defined types were placed in this encounter.  Meds ordered this encounter  Medications   olmesartan  (BENICAR ) 20 MG tablet    Sig: Take 1 tablet (20 mg total) by mouth daily.    Dispense:  90 tablet    Refill:  3    Patient Instructions  Medication Instructions:  Your physician has recommended you make the following change in your medication:  STOP: Nifedipine   START: Olmesartan  20 mg once daily  *  If you need a refill on your cardiac medications before your next appointment, please call your pharmacy*  Follow-Up: At [facility], you and your health needs are our priority.  As part of our continuing mission to provide you with exceptional heart care, our providers are all part of one team.  This team includes your primary Cardiologist (physician) and Advanced Practice Providers or APPs (Physician Assistants and Nurse Practitioners) who all work together to provide you with the care you need, when you need it.  Your next appointment:   12 week(s)  Provider:   Nella Botsford, DO    Other Instructions Please take your blood pressure daily for 2 weeks and send in a MyChart message. Please include heart rates. (One message at the end of the 2 weeks).   HOW TO TAKE YOUR BLOOD PRESSURE: Rest 5 minutes before taking your blood pressure. Dont smoke or drink caffeinated beverages for at least 30 minutes before. Take your blood pressure before (not after) you eat. Sit comfortably with your back supported and both feet on the floor (dont cross your legs). Elevate your arm to heart level on a table or a  desk. Use the proper sized cuff. It should fit smoothly and snugly around your bare upper arm. There should be enough room to slip a fingertip under the cuff. The bottom edge of the cuff should be 1 inch above the crease of the elbow. Ideally, take 3 measurements at one sitting and record the average.  Blood Pressure Log Date/Time Medications taken? (Y/N) Blood Pressure Heart Rate/Pulse                                                                                                                                                                                             Adopting a Healthy Lifestyle.  Know what a healthy weight is for you (roughly BMI <25) and aim to maintain this   Aim for 7+ servings of fruits and vegetables daily   65-80+ fluid ounces of water or unsweet tea for healthy kidneys   Limit to max 1 drink of alcohol per day; avoid smoking/tobacco   Limit animal fats in diet for cholesterol and heart health - choose grass fed whenever available   Avoid highly processed foods, and foods high in saturated/trans fats   Aim for low stress - take time to unwind and care for your mental health   Aim for 150 min of moderate intensity exercise weekly for heart health, and weights twice weekly for bone health   Aim for 7-9 hours of sleep daily   When it comes to diets, agreement  about the perfect plan isnt easy to find, even among the experts. Experts at the [facility] [employer] developed an idea known as the Healthy Eating Plate. Just imagine a plate divided into logical, healthy portions.   The emphasis is on diet quality:   Load up on vegetables and fruits - one-half of your plate: Aim for color and variety, and remember that potatoes dont count.   Go for whole grains - one-quarter of your plate: Whole wheat, barley, wheat berries, quinoa, oats, brown rice, and foods made with them. If you want pasta, go with whole wheat pasta.    Protein power - one-quarter of your plate: Fish, chicken, beans, and nuts are all healthy, versatile protein sources. Limit red meat.   The diet, however, does go beyond the plate, offering a few other suggestions.   Use healthy plant oils, such as olive, canola, soy, corn, sunflower and peanut. Check the labels, and avoid partially hydrogenated oil, which have unhealthy trans fats.   If youre thirsty, drink water. Coffee and tea are good in moderation, but skip sugary drinks and limit milk and dairy products to one or two daily servings.   The type of carbohydrate in the diet is more important than the amount. Some sources of carbohydrates, such as vegetables, fruits, whole grains, and beans-are healthier than others.   Finally, stay active  Signed, Kimarion Chery, DO  05/29/2024 2:31 PM    [facility]     [1]  Current Meds  Medication Sig   ferrous sulfate  325 (65 FE) MG tablet Take 325 mg by mouth daily with breakfast.   ibuprofen  (ADVIL ) 600 MG tablet Take 1 tablet (600 mg total) by mouth every 6 (six) hours.   nitroGLYCERIN  (NITROSTAT ) 0.4 MG SL tablet Place 1 tablet (0.4 mg total) under the tongue every 5 (five) minutes as needed for chest pain.   olmesartan  (BENICAR ) 20 MG tablet Take 1 tablet (20 mg total) by mouth daily.   VITAMIN D PO Take by mouth.   [DISCONTINUED] NIFEdipine  (ADALAT  CC) 60 MG 24 hr tablet Take 1 tablet (60 mg total) by mouth 2 (two) times daily.   "

## 2024-05-28 NOTE — Patient Instructions (Signed)
 Medication Instructions:  Your physician has recommended you make the following change in your medication:  STOP: Nifedipine   START: Olmesartan  20 mg once daily  *If you need a refill on your cardiac medications before your next appointment, please call your pharmacy*  Follow-Up: At [facility], you and your health needs are our priority.  As part of our continuing mission to provide you with exceptional heart care, our providers are all part of one team.  This team includes your primary Cardiologist (physician) and Advanced Practice Providers or APPs (Physician Assistants and Nurse Practitioners) who all work together to provide you with the care you need, when you need it.  Your next appointment:   12 week(s)  Provider:   Kardie Tobb, DO    Other Instructions Please take your blood pressure daily for 2 weeks and send in a MyChart message. Please include heart rates. (One message at the end of the 2 weeks).   HOW TO TAKE YOUR BLOOD PRESSURE: Rest 5 minutes before taking your blood pressure. Dont smoke or drink caffeinated beverages for at least 30 minutes before. Take your blood pressure before (not after) you eat. Sit comfortably with your back supported and both feet on the floor (dont cross your legs). Elevate your arm to heart level on a table or a desk. Use the proper sized cuff. It should fit smoothly and snugly around your bare upper arm. There should be enough room to slip a fingertip under the cuff. The bottom edge of the cuff should be 1 inch above the crease of the elbow. Ideally, take 3 measurements at one sitting and record the average.  Blood Pressure Log Date/Time Medications taken? (Y/N) Blood Pressure Heart Rate/Pulse

## 2024-06-20 DIAGNOSIS — Z0289 Encounter for other administrative examinations: Secondary | ICD-10-CM

## 2024-08-23 ENCOUNTER — Ambulatory Visit: Admitting: Cardiology
